# Patient Record
Sex: Male | Born: 1945
Health system: Southern US, Community
[De-identification: ages and names within clinical notes are randomized; demographics above are authoritative.]

## PROBLEM LIST (undated history)

## (undated) DIAGNOSIS — G4700 Insomnia, unspecified: Secondary | ICD-10-CM

## (undated) DIAGNOSIS — K219 Gastro-esophageal reflux disease without esophagitis: Secondary | ICD-10-CM

## (undated) DIAGNOSIS — M1990 Unspecified osteoarthritis, unspecified site: Secondary | ICD-10-CM

## (undated) DIAGNOSIS — R51 Headache: Secondary | ICD-10-CM

## (undated) DIAGNOSIS — Z8619 Personal history of other infectious and parasitic diseases: Secondary | ICD-10-CM

## (undated) DIAGNOSIS — I219 Acute myocardial infarction, unspecified: Secondary | ICD-10-CM

## (undated) DIAGNOSIS — M549 Dorsalgia, unspecified: Secondary | ICD-10-CM

## (undated) DIAGNOSIS — T4145XA Adverse effect of unspecified anesthetic, initial encounter: Secondary | ICD-10-CM

## (undated) DIAGNOSIS — T8859XA Other complications of anesthesia, initial encounter: Secondary | ICD-10-CM

## (undated) DIAGNOSIS — M2550 Pain in unspecified joint: Secondary | ICD-10-CM

## (undated) DIAGNOSIS — M109 Gout, unspecified: Secondary | ICD-10-CM

## (undated) DIAGNOSIS — G8929 Other chronic pain: Secondary | ICD-10-CM

## (undated) DIAGNOSIS — IMO0001 Reserved for inherently not codable concepts without codable children: Secondary | ICD-10-CM

## (undated) DIAGNOSIS — J189 Pneumonia, unspecified organism: Secondary | ICD-10-CM

## (undated) DIAGNOSIS — Z7189 Other specified counseling: Secondary | ICD-10-CM

## (undated) DIAGNOSIS — R1310 Dysphagia, unspecified: Secondary | ICD-10-CM

## (undated) DIAGNOSIS — G4733 Obstructive sleep apnea (adult) (pediatric): Secondary | ICD-10-CM

## (undated) DIAGNOSIS — Z860100 Personal history of colon polyps, unspecified: Secondary | ICD-10-CM

## (undated) DIAGNOSIS — K5790 Diverticulosis of intestine, part unspecified, without perforation or abscess without bleeding: Secondary | ICD-10-CM

## (undated) DIAGNOSIS — R519 Headache, unspecified: Secondary | ICD-10-CM

## (undated) DIAGNOSIS — J45909 Unspecified asthma, uncomplicated: Secondary | ICD-10-CM

## (undated) DIAGNOSIS — R0902 Hypoxemia: Secondary | ICD-10-CM

## (undated) DIAGNOSIS — N159 Renal tubulo-interstitial disease, unspecified: Secondary | ICD-10-CM

## (undated) DIAGNOSIS — I10 Essential (primary) hypertension: Secondary | ICD-10-CM

## (undated) DIAGNOSIS — E039 Hypothyroidism, unspecified: Secondary | ICD-10-CM

## (undated) DIAGNOSIS — F419 Anxiety disorder, unspecified: Secondary | ICD-10-CM

## (undated) DIAGNOSIS — H409 Unspecified glaucoma: Secondary | ICD-10-CM

## (undated) HISTORY — PX: KNEE ARTHROSCOPY: SUR90

## (undated) HISTORY — DX: Other specified counseling: Z71.89

## (undated) HISTORY — PX: HERNIA REPAIR: SHX51

## (undated) HISTORY — PX: ESOPHAGOGASTRODUODENOSCOPY: SHX1529

## (undated) HISTORY — DX: Hypoxemia: R09.02

## (undated) HISTORY — PX: EYE SURGERY: SHX253

## (undated) HISTORY — PX: CARDIAC CATHETERIZATION: SHX172

## (undated) HISTORY — PX: COLONOSCOPY: SHX174

## (undated) HISTORY — DX: Obstructive sleep apnea (adult) (pediatric): G47.33

## (undated) HISTORY — PX: TONSILLECTOMY: SUR1361

## (undated) HISTORY — PX: BACK SURGERY: SHX140

---

## 2004-09-30 ENCOUNTER — Ambulatory Visit: Payer: Self-pay | Admitting: Cardiology

## 2004-10-01 ENCOUNTER — Inpatient Hospital Stay (HOSPITAL_COMMUNITY): Admission: AD | Admit: 2004-10-01 | Discharge: 2004-10-05 | Payer: Self-pay | Admitting: Internal Medicine

## 2013-12-21 ENCOUNTER — Other Ambulatory Visit: Payer: Self-pay | Admitting: Orthopedic Surgery

## 2013-12-21 DIAGNOSIS — M47812 Spondylosis without myelopathy or radiculopathy, cervical region: Secondary | ICD-10-CM

## 2013-12-31 ENCOUNTER — Ambulatory Visit
Admission: RE | Admit: 2013-12-31 | Discharge: 2013-12-31 | Disposition: A | Discharge status: Home / Self Care | Payer: Medicare Other | Source: Ambulatory Visit | Attending: Orthopedic Surgery | Admitting: Orthopedic Surgery

## 2013-12-31 DIAGNOSIS — M47812 Spondylosis without myelopathy or radiculopathy, cervical region: Secondary | ICD-10-CM

## 2014-01-21 DIAGNOSIS — M5032 Other cervical disc degeneration, mid-cervical region: Secondary | ICD-10-CM | POA: Diagnosis not present

## 2014-01-21 DIAGNOSIS — M5412 Radiculopathy, cervical region: Secondary | ICD-10-CM | POA: Diagnosis not present

## 2014-02-11 DIAGNOSIS — E785 Hyperlipidemia, unspecified: Secondary | ICD-10-CM | POA: Diagnosis not present

## 2014-02-11 DIAGNOSIS — I10 Essential (primary) hypertension: Secondary | ICD-10-CM | POA: Diagnosis not present

## 2014-02-11 DIAGNOSIS — I2510 Atherosclerotic heart disease of native coronary artery without angina pectoris: Secondary | ICD-10-CM | POA: Diagnosis not present

## 2014-02-11 DIAGNOSIS — E039 Hypothyroidism, unspecified: Secondary | ICD-10-CM | POA: Diagnosis not present

## 2014-02-11 DIAGNOSIS — M542 Cervicalgia: Secondary | ICD-10-CM | POA: Diagnosis not present

## 2014-02-15 DIAGNOSIS — M47812 Spondylosis without myelopathy or radiculopathy, cervical region: Secondary | ICD-10-CM | POA: Diagnosis not present

## 2014-02-15 DIAGNOSIS — M5412 Radiculopathy, cervical region: Secondary | ICD-10-CM | POA: Diagnosis not present

## 2014-02-23 DIAGNOSIS — H4011X1 Primary open-angle glaucoma, mild stage: Secondary | ICD-10-CM | POA: Diagnosis not present

## 2014-03-02 DIAGNOSIS — Z6841 Body Mass Index (BMI) 40.0 and over, adult: Secondary | ICD-10-CM | POA: Diagnosis not present

## 2014-03-02 DIAGNOSIS — M4722 Other spondylosis with radiculopathy, cervical region: Secondary | ICD-10-CM | POA: Diagnosis not present

## 2014-03-11 DIAGNOSIS — I10 Essential (primary) hypertension: Secondary | ICD-10-CM | POA: Diagnosis not present

## 2014-03-11 DIAGNOSIS — E785 Hyperlipidemia, unspecified: Secondary | ICD-10-CM | POA: Diagnosis not present

## 2014-03-11 DIAGNOSIS — E039 Hypothyroidism, unspecified: Secondary | ICD-10-CM | POA: Diagnosis not present

## 2014-03-11 DIAGNOSIS — L309 Dermatitis, unspecified: Secondary | ICD-10-CM | POA: Diagnosis not present

## 2014-03-11 DIAGNOSIS — J0101 Acute recurrent maxillary sinusitis: Secondary | ICD-10-CM | POA: Diagnosis not present

## 2014-03-11 DIAGNOSIS — I2510 Atherosclerotic heart disease of native coronary artery without angina pectoris: Secondary | ICD-10-CM | POA: Diagnosis not present

## 2014-03-21 ENCOUNTER — Other Ambulatory Visit (HOSPITAL_COMMUNITY): Payer: Self-pay | Admitting: Neurosurgery

## 2014-03-22 DIAGNOSIS — E039 Hypothyroidism, unspecified: Secondary | ICD-10-CM | POA: Diagnosis not present

## 2014-03-22 DIAGNOSIS — I2510 Atherosclerotic heart disease of native coronary artery without angina pectoris: Secondary | ICD-10-CM | POA: Diagnosis not present

## 2014-03-22 DIAGNOSIS — M542 Cervicalgia: Secondary | ICD-10-CM | POA: Diagnosis not present

## 2014-03-22 DIAGNOSIS — N390 Urinary tract infection, site not specified: Secondary | ICD-10-CM | POA: Diagnosis not present

## 2014-03-22 DIAGNOSIS — E785 Hyperlipidemia, unspecified: Secondary | ICD-10-CM | POA: Diagnosis not present

## 2014-04-04 DIAGNOSIS — I2510 Atherosclerotic heart disease of native coronary artery without angina pectoris: Secondary | ICD-10-CM | POA: Diagnosis not present

## 2014-04-04 DIAGNOSIS — K649 Unspecified hemorrhoids: Secondary | ICD-10-CM | POA: Diagnosis not present

## 2014-04-04 DIAGNOSIS — N390 Urinary tract infection, site not specified: Secondary | ICD-10-CM | POA: Diagnosis not present

## 2014-04-04 DIAGNOSIS — E785 Hyperlipidemia, unspecified: Secondary | ICD-10-CM | POA: Diagnosis not present

## 2014-04-04 DIAGNOSIS — E039 Hypothyroidism, unspecified: Secondary | ICD-10-CM | POA: Diagnosis not present

## 2014-04-11 ENCOUNTER — Encounter (HOSPITAL_COMMUNITY): Payer: Self-pay

## 2014-04-11 ENCOUNTER — Encounter (HOSPITAL_COMMUNITY)
Admission: RE | Admit: 2014-04-11 | Discharge: 2014-04-11 | Disposition: A | Discharge status: Home / Self Care | Payer: Medicare Other | Source: Ambulatory Visit | Attending: Neurosurgery | Admitting: Neurosurgery

## 2014-04-11 DIAGNOSIS — Z01812 Encounter for preprocedural laboratory examination: Secondary | ICD-10-CM | POA: Insufficient documentation

## 2014-04-11 DIAGNOSIS — I252 Old myocardial infarction: Secondary | ICD-10-CM | POA: Diagnosis not present

## 2014-04-11 DIAGNOSIS — E039 Hypothyroidism, unspecified: Secondary | ICD-10-CM | POA: Insufficient documentation

## 2014-04-11 DIAGNOSIS — I10 Essential (primary) hypertension: Secondary | ICD-10-CM | POA: Diagnosis not present

## 2014-04-11 HISTORY — DX: Personal history of colon polyps, unspecified: Z86.0100

## 2014-04-11 HISTORY — DX: Gastro-esophageal reflux disease without esophagitis: K21.9

## 2014-04-11 HISTORY — DX: Acute myocardial infarction, unspecified: I21.9

## 2014-04-11 HISTORY — DX: Dorsalgia, unspecified: M54.9

## 2014-04-11 HISTORY — DX: Unspecified glaucoma: H40.9

## 2014-04-11 HISTORY — DX: Essential (primary) hypertension: I10

## 2014-04-11 HISTORY — DX: Personal history of colonic polyps: Z86.010

## 2014-04-11 HISTORY — DX: Other chronic pain: G89.29

## 2014-04-11 HISTORY — DX: Unspecified osteoarthritis, unspecified site: M19.90

## 2014-04-11 HISTORY — DX: Hypothyroidism, unspecified: E03.9

## 2014-04-11 HISTORY — DX: Diverticulosis of intestine, part unspecified, without perforation or abscess without bleeding: K57.90

## 2014-04-11 HISTORY — DX: Gout, unspecified: M10.9

## 2014-04-11 HISTORY — DX: Pneumonia, unspecified organism: J18.9

## 2014-04-11 HISTORY — DX: Headache, unspecified: R51.9

## 2014-04-11 HISTORY — DX: Pain in unspecified joint: M25.50

## 2014-04-11 HISTORY — DX: Personal history of other infectious and parasitic diseases: Z86.19

## 2014-04-11 HISTORY — DX: Unspecified asthma, uncomplicated: J45.909

## 2014-04-11 HISTORY — DX: Insomnia, unspecified: G47.00

## 2014-04-11 HISTORY — DX: Renal tubulo-interstitial disease, unspecified: N15.9

## 2014-04-11 HISTORY — DX: Headache: R51

## 2014-04-11 HISTORY — DX: Reserved for inherently not codable concepts without codable children: IMO0001

## 2014-04-11 HISTORY — DX: Anxiety disorder, unspecified: F41.9

## 2014-04-11 LAB — BASIC METABOLIC PANEL
Anion gap: 10 (ref 5–15)
BUN: 14 mg/dL (ref 6–23)
CO2: 25 mmol/L (ref 19–32)
Calcium: 9.2 mg/dL (ref 8.4–10.5)
Chloride: 105 mmol/L (ref 96–112)
Creatinine, Ser: 0.93 mg/dL (ref 0.50–1.35)
GFR calc Af Amer: >90 mL/min (ref >90)
GFR calc non Af Amer: 83 mL/min — ABNORMAL LOW (ref >90)
Glucose, Bld: 114 mg/dL — ABNORMAL HIGH (ref 70–99)
Potassium: 3.9 mmol/L (ref 3.5–5.1)
Sodium: 140 mmol/L (ref 135–145)

## 2014-04-11 LAB — CBC
HCT: 44.5 % (ref 39.0–52.0)
Hemoglobin: 15.1 g/dL (ref 13.0–17.0)
MCH: 32 pg (ref 26.0–34.0)
MCHC: 33.9 g/dL (ref 30.0–36.0)
MCV: 94.3 fL (ref 78.0–100.0)
Platelets: 245 10*3/uL (ref 150–400)
RBC: 4.72 MIL/uL (ref 4.22–5.81)
RDW: 13.2 % (ref 11.5–15.5)
WBC: 8.1 10*3/uL (ref 4.0–10.5)

## 2014-04-11 LAB — SURGICAL PCR SCREEN
MRSA, PCR: NEGATIVE
Staphylococcus aureus: NEGATIVE

## 2014-04-11 NOTE — Pre-Procedure Instructions (Addendum)
ARLINGTON SIGMUND  04/11/2014   Your procedure is scheduled on:  Tues, April 5 @ 12:00 PM  Report to [facility]  at 10:00 AM.  Call this number if you have problems the morning of surgery: [phone]   Remember:   Do not eat food or drink liquids after midnight.   Take these medicines the morning of surgery with A SIP OF WATER: Metoprolol(Toprol),Synthroid(Levothyroxine),Pain Pill(if needed),and Xanax(Alprazolam)              No Goody's,BC's,Aleve,Aspirin,Ibuprofen,Fish Oil,or any Herbal Medications.   Do not wear jewelry.  Do not wear lotions, powders, or colognes. You may wear deodorant.  Men may shave face and neck.  Do not bring valuables to the hospital.  [facility] is not responsible                  for any belongings or valuables.               Contacts, dentures or bridgework may not be worn into surgery.  Leave suitcase in the car. After surgery it may be brought to your room.  For patients admitted to the hospital, discharge time is determined by your                treatment team.                Special Instructions:  [place] - Preparing for Surgery  Before surgery, you can play an important role.  Because skin is not sterile, your skin needs to be as free of germs as possible.  You can reduce the number of germs on you skin by washing with CHG (chlorahexidine gluconate) soap before surgery.  CHG is an antiseptic cleaner which kills germs and bonds with the skin to continue killing germs even after washing.  Please DO NOT use if you have an allergy to CHG or antibacterial soaps.  If your skin becomes reddened/irritated stop using the CHG and inform your nurse when you arrive at Short Stay.  Do not shave (including legs and underarms) for at least 48 hours prior to the first CHG shower.  You may shave your face.  Please follow these instructions carefully:   1.  Shower with CHG Soap the night before surgery and the                                morning  of Surgery.  2.  If you choose to wash your hair, wash your hair first as usual with your       normal shampoo.  3.  After you shampoo, rinse your hair and body thoroughly to remove the                      Shampoo.  4.  Use CHG as you would any other liquid soap.  You can apply chg directly       to the skin and wash gently with scrungie or a clean washcloth.  5.  Apply the CHG Soap to your body ONLY FROM THE NECK DOWN.        Do not use on open wounds or open sores.  Avoid contact with your eyes,       ears, mouth and genitals (private parts).  Wash genitals (private parts)       with your normal soap.  6.  Wash thoroughly, paying special  attention to the area where your surgery        will be performed.  7.  Thoroughly rinse your body with warm water from the neck down.  8.  DO NOT shower/wash with your normal soap after using and rinsing off       the CHG Soap.  9.  Pat yourself dry with a clean towel.            10.  Wear clean pajamas.            11.  Place clean sheets on your bed the night of your first shower and do not        sleep with pets.  Day of Surgery  Do not apply any lotions/deoderants the morning of surgery.  Please wear clean clothes to the hospital/surgery center.     Please read over the following fact sheets that you were given: Pain Booklet, Coughing and Deep Breathing, MRSA Information and Surgical Site Infection Prevention

## 2014-04-11 NOTE — Progress Notes (Addendum)
Dr.Revencar is cardiologist-last visit a few months ago-to request report([facility])  EKG done a couple of months ago to request from Dr.Revencar([facility])  Stress test done a couple of months ago-to request from [place] is Dr.Lee with [facility]   Echo done many years ago-so long he can't rememeber  Heart cath > 14yrs ago

## 2014-04-12 NOTE — Progress Notes (Addendum)
Anesthesia Chart Review:  Patient is a 69 year old male scheduled for C3-4, C4-5, C5-6 ACDF on 04/19/14 by Dr. Kathyrn Sheriff.  History includes non-smoker, HTN, MI > 10 years ago, exertional dyspnea, asthma, glaucoma, GERD, hypothyroidism, anxiety, spinal fusion. BMI is consistent with morbid obesity. PCP is Dr. Truman Hayward with [facility].  Cardiologist is Dr. Lennox Pippins with [facility] in [place].   Meds include albuterol, Xanax, Bentyl, Xalatan, levothyroxine, Toprol, Halcion.  Preoperative labs noted.   Chart will be left for follow-up regarding most recent cardiology records. He reported recent cardiology visit with stress test.  Last cath and echo > 10 years ago per patient.  [facility] Short Stay Center/Anesthesiology Phone [phone] 04/12/2014 11:05 AM  Addendum: Cardiology records re-requested today and yesterday, but are still pending.  I was able to get some records from [facility] including his 2015 EKG and stress test.    08/10/13 Lexiscan Sestamibi Rest Stress Scan: No evidence of ischemia on the scan. Normal EF, 65%. No significant symptoms, EKG changes, or arrhythmias occurred.  He had two EKGs that day one that showed NSR and one that showed NSR, cannot rule out inferior infarct (age undetermined)--although both EKGs appeared similar with isolated Q wave in lead III. I think there is also non-specific inferior ST abnormality.    I've asked nursing staff to have me review any additional cardiac records if received, but based on his recent non-ischemic stress test I would anticipate that he could proceed as planned.  [facility] Short Stay Center/Anesthesiology Phone [phone] 04/14/2014 3:55 PM

## 2014-04-18 MED ORDER — DEXTROSE 5 % IV SOLN
3.0000 g | INTRAVENOUS | Status: AC
  Administered 2014-04-19 (×2): 3 g via INTRAVENOUS
  Filled 2014-04-18: qty 3000

## 2014-04-19 ENCOUNTER — Encounter (HOSPITAL_COMMUNITY): Payer: Self-pay | Admitting: Anesthesiology

## 2014-04-19 ENCOUNTER — Inpatient Hospital Stay (HOSPITAL_COMMUNITY): Payer: Medicare Other

## 2014-04-19 ENCOUNTER — Inpatient Hospital Stay (HOSPITAL_COMMUNITY)
Admission: RE | Admit: 2014-04-19 | Discharge: 2014-04-23 | DRG: 473 | Disposition: A | Discharge status: Home / Self Care | Payer: Medicare Other | Source: Ambulatory Visit | Attending: Neurosurgery | Admitting: Neurosurgery

## 2014-04-19 ENCOUNTER — Encounter (HOSPITAL_COMMUNITY)
Admission: RE | Disposition: A | Discharge status: Home / Self Care | Payer: Self-pay | Source: Ambulatory Visit | Attending: Neurosurgery

## 2014-04-19 ENCOUNTER — Inpatient Hospital Stay (HOSPITAL_COMMUNITY): Payer: Medicare Other | Admitting: Emergency Medicine

## 2014-04-19 ENCOUNTER — Inpatient Hospital Stay (HOSPITAL_COMMUNITY): Payer: Medicare Other | Admitting: Vascular Surgery

## 2014-04-19 DIAGNOSIS — M109 Gout, unspecified: Secondary | ICD-10-CM | POA: Diagnosis present

## 2014-04-19 DIAGNOSIS — K5790 Diverticulosis of intestine, part unspecified, without perforation or abscess without bleeding: Secondary | ICD-10-CM | POA: Diagnosis present

## 2014-04-19 DIAGNOSIS — M542 Cervicalgia: Secondary | ICD-10-CM | POA: Diagnosis present

## 2014-04-19 DIAGNOSIS — K219 Gastro-esophageal reflux disease without esophagitis: Secondary | ICD-10-CM | POA: Diagnosis present

## 2014-04-19 DIAGNOSIS — Z79899 Other long term (current) drug therapy: Secondary | ICD-10-CM | POA: Diagnosis not present

## 2014-04-19 DIAGNOSIS — N159 Renal tubulo-interstitial disease, unspecified: Secondary | ICD-10-CM | POA: Diagnosis not present

## 2014-04-19 DIAGNOSIS — I252 Old myocardial infarction: Secondary | ICD-10-CM | POA: Diagnosis not present

## 2014-04-19 DIAGNOSIS — F419 Anxiety disorder, unspecified: Secondary | ICD-10-CM | POA: Diagnosis not present

## 2014-04-19 DIAGNOSIS — E039 Hypothyroidism, unspecified: Secondary | ICD-10-CM | POA: Diagnosis not present

## 2014-04-19 DIAGNOSIS — Z981 Arthrodesis status: Secondary | ICD-10-CM | POA: Diagnosis not present

## 2014-04-19 DIAGNOSIS — M1990 Unspecified osteoarthritis, unspecified site: Secondary | ICD-10-CM | POA: Diagnosis not present

## 2014-04-19 DIAGNOSIS — Z419 Encounter for procedure for purposes other than remedying health state, unspecified: Secondary | ICD-10-CM

## 2014-04-19 DIAGNOSIS — M549 Dorsalgia, unspecified: Secondary | ICD-10-CM | POA: Diagnosis not present

## 2014-04-19 DIAGNOSIS — R1310 Dysphagia, unspecified: Secondary | ICD-10-CM | POA: Diagnosis not present

## 2014-04-19 DIAGNOSIS — I10 Essential (primary) hypertension: Secondary | ICD-10-CM | POA: Diagnosis present

## 2014-04-19 DIAGNOSIS — G8929 Other chronic pain: Secondary | ICD-10-CM | POA: Diagnosis present

## 2014-04-19 DIAGNOSIS — M5001 Cervical disc disorder with myelopathy,  high cervical region: Secondary | ICD-10-CM | POA: Diagnosis not present

## 2014-04-19 DIAGNOSIS — M4712 Other spondylosis with myelopathy, cervical region: Secondary | ICD-10-CM | POA: Diagnosis not present

## 2014-04-19 DIAGNOSIS — H409 Unspecified glaucoma: Secondary | ICD-10-CM | POA: Diagnosis present

## 2014-04-19 DIAGNOSIS — Z7951 Long term (current) use of inhaled steroids: Secondary | ICD-10-CM

## 2014-04-19 DIAGNOSIS — G4700 Insomnia, unspecified: Secondary | ICD-10-CM | POA: Diagnosis not present

## 2014-04-19 DIAGNOSIS — M4322 Fusion of spine, cervical region: Secondary | ICD-10-CM | POA: Diagnosis not present

## 2014-04-19 DIAGNOSIS — M4802 Spinal stenosis, cervical region: Secondary | ICD-10-CM | POA: Diagnosis not present

## 2014-04-19 HISTORY — PX: ANTERIOR CERVICAL DECOMP/DISCECTOMY FUSION: SHX1161

## 2014-04-19 LAB — CREATININE, SERUM
Creatinine, Ser: 1.21 mg/dL (ref 0.50–1.35)
GFR calc Af Amer: 68 mL/min — ABNORMAL LOW (ref >90)
GFR calc non Af Amer: 59 mL/min — ABNORMAL LOW (ref >90)

## 2014-04-19 LAB — CBC
HCT: 43.1 % (ref 39.0–52.0)
Hemoglobin: 14.4 g/dL (ref 13.0–17.0)
MCH: 31.2 pg (ref 26.0–34.0)
MCHC: 33.4 g/dL (ref 30.0–36.0)
MCV: 93.5 fL (ref 78.0–100.0)
Platelets: 176 10*3/uL (ref 150–400)
RBC: 4.61 MIL/uL (ref 4.22–5.81)
RDW: 13.5 % (ref 11.5–15.5)
WBC: 9.5 10*3/uL (ref 4.0–10.5)

## 2014-04-19 SURGERY — ANTERIOR CERVICAL DECOMPRESSION/DISCECTOMY FUSION 3 LEVELS
Anesthesia: General

## 2014-04-19 MED ORDER — PHENYLEPHRINE HCL 10 MG/ML IJ SOLN
INTRAMUSCULAR | Status: AC
  Filled 2014-04-19: qty 2

## 2014-04-19 MED ORDER — ONDANSETRON HCL 4 MG/2ML IJ SOLN
INTRAMUSCULAR | Status: DC | PRN
  Administered 2014-04-19: 4 mg via INTRAVENOUS

## 2014-04-19 MED ORDER — SODIUM CHLORIDE 0.9 % IV SOLN: INTRAVENOUS | Status: DC

## 2014-04-19 MED ORDER — SUCCINYLCHOLINE CHLORIDE 20 MG/ML IJ SOLN
INTRAMUSCULAR | Status: AC
  Filled 2014-04-19: qty 1

## 2014-04-19 MED ORDER — DICYCLOMINE HCL 10 MG PO CAPS
10.0000 mg | ORAL_CAPSULE | Freq: Four times a day (QID) | ORAL | Status: DC | PRN
  Filled 2014-04-19: qty 1

## 2014-04-19 MED ORDER — SODIUM CHLORIDE 0.9 % IJ SOLN
3.0000 mL | Freq: Two times a day (BID) | INTRAMUSCULAR | Status: DC
  Administered 2014-04-21 – 2014-04-22 (×3): 3 mL via INTRAVENOUS

## 2014-04-19 MED ORDER — LACTATED RINGERS IV SOLN
INTRAVENOUS | Status: DC
  Administered 2014-04-19 (×4): via INTRAVENOUS

## 2014-04-19 MED ORDER — MORPHINE SULFATE 2 MG/ML IJ SOLN
1.0000 mg | INTRAMUSCULAR | Status: DC | PRN
  Administered 2014-04-19 – 2014-04-20 (×6): 4 mg via INTRAVENOUS
  Administered 2014-04-21 (×2): 2 mg via INTRAVENOUS
  Administered 2014-04-21 – 2014-04-22 (×3): 4 mg via INTRAVENOUS
  Filled 2014-04-19 (×4): qty 2
  Filled 2014-04-19: qty 1
  Filled 2014-04-19 (×4): qty 2
  Filled 2014-04-19: qty 1
  Filled 2014-04-19: qty 2

## 2014-04-19 MED ORDER — LIDOCAINE-EPINEPHRINE 1 %-1:100000 IJ SOLN
INTRAMUSCULAR | Status: DC | PRN
  Administered 2014-04-19: 7 mL

## 2014-04-19 MED ORDER — SODIUM CHLORIDE 0.9 % IJ SOLN: 3.0000 mL | INTRAMUSCULAR | Status: DC | PRN

## 2014-04-19 MED ORDER — ONDANSETRON HCL 4 MG/2ML IJ SOLN: 4.0000 mg | INTRAMUSCULAR | Status: DC | PRN

## 2014-04-19 MED ORDER — LORATADINE 10 MG PO TABS
10.0000 mg | ORAL_TABLET | Freq: Every day | ORAL | Status: DC | PRN
  Administered 2014-04-23: 10 mg via ORAL
  Filled 2014-04-19 (×2): qty 1

## 2014-04-19 MED ORDER — HYDROCHLOROTHIAZIDE 12.5 MG PO CAPS
12.5000 mg | ORAL_CAPSULE | Freq: Every day | ORAL | Status: DC
  Administered 2014-04-21 – 2014-04-22 (×2): 12.5 mg via ORAL
  Filled 2014-04-19 (×5): qty 1

## 2014-04-19 MED ORDER — FENTANYL CITRATE 0.05 MG/ML IJ SOLN
INTRAMUSCULAR | Status: AC
  Filled 2014-04-19: qty 5

## 2014-04-19 MED ORDER — MEPERIDINE HCL 25 MG/ML IJ SOLN: 6.2500 mg | INTRAMUSCULAR | Status: DC | PRN

## 2014-04-19 MED ORDER — LISINOPRIL-HYDROCHLOROTHIAZIDE 20-12.5 MG PO TABS: 1.0000 | ORAL_TABLET | Freq: Every day | ORAL | Status: DC

## 2014-04-19 MED ORDER — ACETAMINOPHEN 10 MG/ML IV SOLN: 1000.0000 mg | Freq: Once | INTRAVENOUS | Status: DC

## 2014-04-19 MED ORDER — METOPROLOL SUCCINATE ER 50 MG PO TB24
50.0000 mg | ORAL_TABLET | Freq: Every day | ORAL | Status: DC
Start: 2014-04-20 — End: 2014-04-23
  Administered 2014-04-21 – 2014-04-22 (×2): 50 mg via ORAL
  Filled 2014-04-19 (×4): qty 1

## 2014-04-19 MED ORDER — ALBUTEROL SULFATE (2.5 MG/3ML) 0.083% IN NEBU
2.5000 mg | INHALATION_SOLUTION | Freq: Four times a day (QID) | RESPIRATORY_TRACT | Status: DC | PRN
  Administered 2014-04-23: 2.5 mg via RESPIRATORY_TRACT
  Filled 2014-04-19: qty 3

## 2014-04-19 MED ORDER — THROMBIN 20000 UNITS EX SOLR
CUTANEOUS | Status: DC | PRN
  Administered 2014-04-19: 11:00:00 via TOPICAL

## 2014-04-19 MED ORDER — PHENYLEPHRINE 40 MCG/ML (10ML) SYRINGE FOR IV PUSH (FOR BLOOD PRESSURE SUPPORT)
PREFILLED_SYRINGE | INTRAVENOUS | Status: AC
  Filled 2014-04-19: qty 10

## 2014-04-19 MED ORDER — EPHEDRINE SULFATE 50 MG/ML IJ SOLN
INTRAMUSCULAR | Status: AC
  Filled 2014-04-19: qty 1

## 2014-04-19 MED ORDER — SODIUM CHLORIDE 0.9 % IV SOLN: 250.0000 mL | INTRAVENOUS | Status: DC

## 2014-04-19 MED ORDER — HEPARIN SODIUM (PORCINE) 5000 UNIT/ML IJ SOLN
5000.0000 [IU] | Freq: Three times a day (TID) | INTRAMUSCULAR | Status: DC
  Administered 2014-04-20 – 2014-04-23 (×10): 5000 [IU] via SUBCUTANEOUS
  Filled 2014-04-19 (×13): qty 1

## 2014-04-19 MED ORDER — HYDROMORPHONE HCL 1 MG/ML IJ SOLN
0.2500 mg | INTRAMUSCULAR | Status: DC | PRN
Start: 2014-04-19 — End: 2014-04-19
  Administered 2014-04-19 (×4): 0.5 mg via INTRAVENOUS

## 2014-04-19 MED ORDER — BACITRACIN 50000 UNITS IM SOLR
INTRAMUSCULAR | Status: DC | PRN
  Administered 2014-04-19: 12:00:00

## 2014-04-19 MED ORDER — LEVOTHYROXINE SODIUM 200 MCG PO TABS
200.0000 ug | ORAL_TABLET | Freq: Every day | ORAL | Status: DC
  Administered 2014-04-20 – 2014-04-23 (×4): 200 ug via ORAL
  Filled 2014-04-19 (×5): qty 1

## 2014-04-19 MED ORDER — PROPOFOL 10 MG/ML IV BOLUS
INTRAVENOUS | Status: DC | PRN
  Administered 2014-04-19: 200 mg via INTRAVENOUS

## 2014-04-19 MED ORDER — PROMETHAZINE HCL 25 MG/ML IJ SOLN: 6.2500 mg | INTRAMUSCULAR | Status: DC | PRN

## 2014-04-19 MED ORDER — 0.9 % SODIUM CHLORIDE (POUR BTL) OPTIME
TOPICAL | Status: DC | PRN
  Administered 2014-04-19: 1000 mL

## 2014-04-19 MED ORDER — MENTHOL 3 MG MT LOZG: 1.0000 | LOZENGE | OROMUCOSAL | Status: DC | PRN

## 2014-04-19 MED ORDER — DEXAMETHASONE SODIUM PHOSPHATE 10 MG/ML IJ SOLN
INTRAMUSCULAR | Status: AC
  Filled 2014-04-19: qty 2

## 2014-04-19 MED ORDER — OXYCODONE-ACETAMINOPHEN 5-325 MG PO TABS
1.0000 | ORAL_TABLET | ORAL | Status: DC | PRN
  Administered 2014-04-19: 2 via ORAL
  Filled 2014-04-19: qty 2

## 2014-04-19 MED ORDER — PROPOFOL 10 MG/ML IV BOLUS
INTRAVENOUS | Status: AC
  Filled 2014-04-19: qty 20

## 2014-04-19 MED ORDER — ROCURONIUM BROMIDE 50 MG/5ML IV SOLN
INTRAVENOUS | Status: AC
  Filled 2014-04-19: qty 1

## 2014-04-19 MED ORDER — MIDAZOLAM HCL 2 MG/2ML IJ SOLN
INTRAMUSCULAR | Status: DC | PRN
  Administered 2014-04-19: 2 mg via INTRAVENOUS

## 2014-04-19 MED ORDER — SUCCINYLCHOLINE CHLORIDE 20 MG/ML IJ SOLN
INTRAMUSCULAR | Status: DC | PRN
  Administered 2014-04-19: 140 mg via INTRAVENOUS

## 2014-04-19 MED ORDER — ALPRAZOLAM 0.5 MG PO TABS
0.5000 mg | ORAL_TABLET | Freq: Three times a day (TID) | ORAL | Status: DC | PRN
  Administered 2014-04-19 – 2014-04-20 (×2): 0.5 mg via ORAL
  Filled 2014-04-19 (×2): qty 1

## 2014-04-19 MED ORDER — ACETAMINOPHEN 325 MG PO TABS: 650.0000 mg | ORAL_TABLET | ORAL | Status: DC | PRN

## 2014-04-19 MED ORDER — ACETAMINOPHEN 650 MG RE SUPP: 650.0000 mg | RECTAL | Status: DC | PRN

## 2014-04-19 MED ORDER — HYDROMORPHONE HCL 1 MG/ML IJ SOLN
INTRAMUSCULAR | Status: AC
  Filled 2014-04-19: qty 1

## 2014-04-19 MED ORDER — GLYCOPYRROLATE 0.2 MG/ML IJ SOLN
INTRAMUSCULAR | Status: DC | PRN
  Administered 2014-04-19 (×2): 0.4 mg via INTRAVENOUS

## 2014-04-19 MED ORDER — LIDOCAINE HCL (CARDIAC) 20 MG/ML IV SOLN
INTRAVENOUS | Status: AC
  Filled 2014-04-19: qty 5

## 2014-04-19 MED ORDER — ALBUTEROL SULFATE HFA 108 (90 BASE) MCG/ACT IN AERS: 2.0000 | INHALATION_SPRAY | Freq: Four times a day (QID) | RESPIRATORY_TRACT | Status: DC | PRN

## 2014-04-19 MED ORDER — BUPIVACAINE HCL 0.5 % IJ SOLN
INTRAMUSCULAR | Status: DC | PRN
  Administered 2014-04-19: 7 mL

## 2014-04-19 MED ORDER — LISINOPRIL 20 MG PO TABS
20.0000 mg | ORAL_TABLET | Freq: Every day | ORAL | Status: DC
  Administered 2014-04-21 – 2014-04-22 (×2): 20 mg via ORAL
  Filled 2014-04-19 (×5): qty 1

## 2014-04-19 MED ORDER — TRIAZOLAM 0.125 MG PO TABS
0.2500 mg | ORAL_TABLET | Freq: Every evening | ORAL | Status: DC | PRN
  Administered 2014-04-23: 0.25 mg via ORAL
  Filled 2014-04-19: qty 2

## 2014-04-19 MED ORDER — SODIUM CHLORIDE 0.9 % IJ SOLN
INTRAMUSCULAR | Status: AC
  Filled 2014-04-19: qty 10

## 2014-04-19 MED ORDER — ROCURONIUM BROMIDE 100 MG/10ML IV SOLN
INTRAVENOUS | Status: DC | PRN
  Administered 2014-04-19: 50 mg via INTRAVENOUS

## 2014-04-19 MED ORDER — PHENYLEPHRINE HCL 10 MG/ML IJ SOLN
INTRAMUSCULAR | Status: DC | PRN
  Administered 2014-04-19: 80 ug via INTRAVENOUS

## 2014-04-19 MED ORDER — LATANOPROST 0.005 % OP SOLN
1.0000 [drp] | Freq: Every day | OPHTHALMIC | Status: DC
  Administered 2014-04-19 – 2014-04-22 (×4): 1 [drp] via OPHTHALMIC
  Filled 2014-04-19: qty 2.5

## 2014-04-19 MED ORDER — FENTANYL CITRATE 0.05 MG/ML IJ SOLN
INTRAMUSCULAR | Status: DC | PRN
  Administered 2014-04-19 (×4): 100 ug via INTRAVENOUS
  Administered 2014-04-19: 50 ug via INTRAVENOUS

## 2014-04-19 MED ORDER — PHENYLEPHRINE HCL 10 MG/ML IJ SOLN
20.0000 mg | INTRAVENOUS | Status: DC | PRN
  Administered 2014-04-19: 50 ug/min via INTRAVENOUS

## 2014-04-19 MED ORDER — MIDAZOLAM HCL 2 MG/2ML IJ SOLN
INTRAMUSCULAR | Status: AC
  Filled 2014-04-19: qty 2

## 2014-04-19 MED ORDER — THROMBIN 5000 UNITS EX SOLR
OROMUCOSAL | Status: DC | PRN
  Administered 2014-04-19 (×2): via TOPICAL

## 2014-04-19 MED ORDER — NEOSTIGMINE METHYLSULFATE 10 MG/10ML IV SOLN
INTRAVENOUS | Status: DC | PRN
  Administered 2014-04-19: 3 mg via INTRAVENOUS

## 2014-04-19 MED ORDER — GLYCOPYRROLATE 0.2 MG/ML IJ SOLN
INTRAMUSCULAR | Status: AC
  Filled 2014-04-19: qty 2

## 2014-04-19 MED ORDER — CEFAZOLIN SODIUM 1-5 GM-% IV SOLN
1.0000 g | Freq: Three times a day (TID) | INTRAVENOUS | Status: AC
  Administered 2014-04-19 – 2014-04-20 (×2): 1 g via INTRAVENOUS
  Filled 2014-04-19 (×2): qty 50

## 2014-04-19 MED ORDER — ONDANSETRON HCL 4 MG/2ML IJ SOLN
INTRAMUSCULAR | Status: AC
  Filled 2014-04-19: qty 2

## 2014-04-19 MED ORDER — DEXAMETHASONE SODIUM PHOSPHATE 10 MG/ML IJ SOLN
INTRAMUSCULAR | Status: DC | PRN
  Administered 2014-04-19: 10 mg via INTRAVENOUS

## 2014-04-19 MED ORDER — LIDOCAINE HCL (CARDIAC) 20 MG/ML IV SOLN
INTRAVENOUS | Status: DC | PRN
  Administered 2014-04-19: 50 mg via INTRAVENOUS

## 2014-04-19 MED ORDER — NEOSTIGMINE METHYLSULFATE 10 MG/10ML IV SOLN
INTRAVENOUS | Status: AC
  Filled 2014-04-19: qty 1

## 2014-04-19 MED ORDER — PHENOL 1.4 % MT LIQD
1.0000 | OROMUCOSAL | Status: DC | PRN
  Filled 2014-04-19: qty 177

## 2014-04-19 SURGICAL SUPPLY — 81 items
ADH SKN CLS APL DERMABOND .7 (GAUZE/BANDAGES/DRESSINGS) ×1
APL SKNCLS STERI-STRIP NONHPOA (GAUZE/BANDAGES/DRESSINGS) ×1
BAG DECANTER FOR FLEXI CONT (MISCELLANEOUS) ×3 IMPLANT
BENZOIN TINCTURE PRP APPL 2/3 (GAUZE/BANDAGES/DRESSINGS) ×2 IMPLANT
BLADE CLIPPER SURG (BLADE) ×2 IMPLANT
BLADE SURG 11 STRL SS (BLADE) ×3 IMPLANT
BUR MATCHSTICK NEURO 3.0 LAGG (BURR) ×3 IMPLANT
CAGE EXPANSE 8X6X6 (Cage) ×6 IMPLANT
CAGE PEEK 6X14X11 (Cage) ×4 IMPLANT
CAGE PEEK 7X14X11 (Cage) ×3 IMPLANT
CAGE SPNL 11X14X7XRADOPQ (Cage) IMPLANT
CANISTER SUCT 3000ML PPV (MISCELLANEOUS) ×3 IMPLANT
CLOSURE WOUND 1/2 X4 (GAUZE/BANDAGES/DRESSINGS) ×1
CONT SPEC 4OZ CLIKSEAL STRL BL (MISCELLANEOUS) ×3 IMPLANT
DECANTER SPIKE VIAL GLASS SM (MISCELLANEOUS) ×3 IMPLANT
DERMABOND ADVANCED (GAUZE/BANDAGES/DRESSINGS) ×2
DERMABOND ADVANCED .7 DNX12 (GAUZE/BANDAGES/DRESSINGS) IMPLANT
DRAIN CHANNEL 10M FLAT 3/4 FLT (DRAIN) IMPLANT
DRAPE C-ARM 42X72 X-RAY (DRAPES) ×6 IMPLANT
DRAPE LAPAROTOMY 100X72 PEDS (DRAPES) ×3 IMPLANT
DRAPE MICROSCOPE LEICA (MISCELLANEOUS) ×3 IMPLANT
DRAPE POUCH INSTRU U-SHP 10X18 (DRAPES) ×3 IMPLANT
DRAPE PROXIMA HALF (DRAPES) IMPLANT
DRSG OPSITE POSTOP 3X4 (GAUZE/BANDAGES/DRESSINGS) ×3 IMPLANT
DRSG OPSITE POSTOP 4X6 (GAUZE/BANDAGES/DRESSINGS) ×2 IMPLANT
DRSG TEGADERM 4X4.75 (GAUZE/BANDAGES/DRESSINGS) ×8 IMPLANT
DURAPREP 6ML APPLICATOR 50/CS (WOUND CARE) ×3 IMPLANT
ELECT COATED BLADE 2.86 ST (ELECTRODE) ×3 IMPLANT
ELECT REM PT RETURN 9FT ADLT (ELECTROSURGICAL) ×3
ELECTRODE REM PT RTRN 9FT ADLT (ELECTROSURGICAL) ×1 IMPLANT
EVACUATOR SILICONE 100CC (DRAIN) IMPLANT
GAUZE SPONGE 4X4 16PLY XRAY LF (GAUZE/BANDAGES/DRESSINGS) IMPLANT
GLOVE BIO SURGEON STRL SZ7 (GLOVE) ×6 IMPLANT
GLOVE BIOGEL PI IND STRL 7.0 (GLOVE) IMPLANT
GLOVE BIOGEL PI IND STRL 7.5 (GLOVE) ×1 IMPLANT
GLOVE BIOGEL PI INDICATOR 7.0 (GLOVE) ×4
GLOVE BIOGEL PI INDICATOR 7.5 (GLOVE) ×8
GLOVE ECLIPSE 7.0 STRL STRAW (GLOVE) ×3 IMPLANT
GLOVE ECLIPSE 7.5 STRL STRAW (GLOVE) ×2 IMPLANT
GLOVE ECLIPSE 8.5 STRL (GLOVE) ×2 IMPLANT
GLOVE EXAM NITRILE LRG STRL (GLOVE) IMPLANT
GLOVE EXAM NITRILE MD LF STRL (GLOVE) IMPLANT
GLOVE EXAM NITRILE XL STR (GLOVE) IMPLANT
GLOVE EXAM NITRILE XS STR PU (GLOVE) IMPLANT
GLOVE INDICATOR 8.5 STRL (GLOVE) ×2 IMPLANT
GOWN STRL REUS W/ TWL LRG LVL3 (GOWN DISPOSABLE) ×2 IMPLANT
GOWN STRL REUS W/ TWL XL LVL3 (GOWN DISPOSABLE) IMPLANT
GOWN STRL REUS W/TWL 2XL LVL3 (GOWN DISPOSABLE) ×4 IMPLANT
GOWN STRL REUS W/TWL LRG LVL3 (GOWN DISPOSABLE) ×6
GOWN STRL REUS W/TWL XL LVL3 (GOWN DISPOSABLE)
HEMOSTAT POWDER KIT SURGIFOAM (HEMOSTASIS) ×3 IMPLANT
KIT BASIN OR (CUSTOM PROCEDURE TRAY) ×3 IMPLANT
KIT ROOM TURNOVER OR (KITS) ×3 IMPLANT
LIQUID BAND (GAUZE/BANDAGES/DRESSINGS) ×3 IMPLANT
NDL HYPO 25X1 1.5 SAFETY (NEEDLE) ×1 IMPLANT
NDL SPNL 22GX3.5 QUINCKE BK (NEEDLE) ×1 IMPLANT
NEEDLE HYPO 25X1 1.5 SAFETY (NEEDLE) ×3 IMPLANT
NEEDLE SPNL 22GX3.5 QUINCKE BK (NEEDLE) ×3 IMPLANT
NS IRRIG 1000ML POUR BTL (IV SOLUTION) ×3 IMPLANT
PACK LAMINECTOMY NEURO (CUSTOM PROCEDURE TRAY) ×3 IMPLANT
PAD ARMBOARD 7.5X6 YLW CONV (MISCELLANEOUS) ×9 IMPLANT
PLATE 3 67.5XLCK NS SPNE CVD (Plate) IMPLANT
PLATE 3 ATLANTIS TRANS (Plate) ×3 IMPLANT
PLATE CLIPS (Plate) ×6 IMPLANT
RUBBERBAND STERILE (MISCELLANEOUS) ×6 IMPLANT
SCREW 4.0X13 (Screw) ×18 IMPLANT
SCREW BN 13X4XSLF DRL FXANG (Screw) IMPLANT
SCREW RESCUE 13MM (Screw) ×6 IMPLANT
SCREW RESCUE FIX ANGLE 13 (Screw) IMPLANT
SPONGE INTESTINAL PEANUT (DISPOSABLE) ×5 IMPLANT
SPONGE SURGIFOAM ABS GEL 100 (HEMOSTASIS) ×3 IMPLANT
STRIP CLOSURE SKIN 1/2X4 (GAUZE/BANDAGES/DRESSINGS) ×1 IMPLANT
SUT ETHILON 3 0 FSL (SUTURE) IMPLANT
SUT VIC AB 3-0 SH 8-18 (SUTURE) ×5 IMPLANT
SUT VICRYL 3-0 RB1 18 ABS (SUTURE) ×3 IMPLANT
SYR 20ML ECCENTRIC (SYRINGE) ×3 IMPLANT
TAPE CLOTH 2X10 TAN LF (GAUZE/BANDAGES/DRESSINGS) ×3 IMPLANT
TOWEL OR 17X24 6PK STRL BLUE (TOWEL DISPOSABLE) ×3 IMPLANT
TOWEL OR 17X26 10 PK STRL BLUE (TOWEL DISPOSABLE) ×3 IMPLANT
TRAY FOLEY CATH 16FRSI W/METER (SET/KITS/TRAYS/PACK) ×2 IMPLANT
WATER STERILE IRR 1000ML POUR (IV SOLUTION) ×3 IMPLANT

## 2014-04-19 NOTE — Anesthesia Preprocedure Evaluation (Addendum)
Anesthesia Evaluation  Patient identified by MRN, date of birth, ID band Patient awake    Reviewed: Allergy & Precautions, NPO status , Patient's Chart, lab work & pertinent test results  Airway Mallampati: II  TM Distance: >3 FB Neck ROM: Full    Dental no notable dental hx.    Pulmonary shortness of breath and with exertion, asthma , pneumonia -,  breath sounds clear to auscultation  Pulmonary exam normal       Cardiovascular hypertension, Pt. on home beta blockers and Pt. on medications + Past MI negative cardio ROS  Rhythm:Regular Rate:Normal     Neuro/Psych  Headaches, PSYCHIATRIC DISORDERS Anxiety    GI/Hepatic Neg liver ROS, GERD-  ,  Endo/Other  Hypothyroidism Morbid obesity  Renal/GU Renal disease     Musculoskeletal  (+) Arthritis -,   Abdominal   Peds  Hematology negative hematology ROS (+)   Anesthesia Other Findings   Reproductive/Obstetrics                             Anesthesia Physical Anesthesia Plan  ASA: III  Anesthesia Plan: General   Post-op Pain Management:    Induction: Intravenous  Airway Management Planned: Oral ETT  Additional Equipment:   Intra-op Plan:   Post-operative Plan: Extubation in OR  Informed Consent: I have reviewed the patients History and Physical, chart, labs and discussed the procedure including the risks, benefits and alternatives for the proposed anesthesia with the patient or authorized representative who has indicated his/her understanding and acceptance.   Dental advisory given  Plan Discussed with: CRNA  Anesthesia Plan Comments:         Anesthesia Quick Evaluation

## 2014-04-19 NOTE — Progress Notes (Signed)
Pt seen in PACU. Awake, alert. Following commands and moving all extremities well. Neck soft.

## 2014-04-19 NOTE — Anesthesia Procedure Notes (Signed)
Procedure Name: Intubation Date/Time: 04/19/2014 11:42 AM Performed by: Rebekah Chesterfield L Pre-anesthesia Checklist: Patient identified, Emergency Drugs available, Suction available, Patient being monitored and Timeout performed Patient Re-evaluated:Patient Re-evaluated prior to inductionOxygen Delivery Method: Circle system utilized Preoxygenation: Pre-oxygenation with 100% oxygen Intubation Type: IV induction and Cricoid Pressure applied Ventilation: Mask ventilation without difficulty Laryngoscope Size: Mac and 4 Grade View: Grade III Tube type: Oral Tube size: 8.0 mm Number of attempts: 1 Airway Equipment and Method: Stylet Placement Confirmation: positive ETCO2 and breath sounds checked- equal and bilateral Secured at: 23 cm Tube secured with: Tape Dental Injury: Teeth and Oropharynx as per pre-operative assessment

## 2014-04-19 NOTE — H&P (Signed)
CC:  Neck and arm pain  HPI: Mr. Gentles is a 69 year old male seen for initial consultation in the office. He comes in for a second opinion regarding his cervical disc disease. He says his symptoms began approximately 3 months ago primarily with posterior neck pain, and headache. There was not any identifiable inciting event. He describes a fairly sharp, relatively severe pain extending from the back of his neck up into the back of his head. In addition to this, he is also been having radiation of the pain along the right side of his neck, into the shoulder, and arm. He also describes tingling in both of his hands. He does not have any significant motor weakness of the arms that he has noticed. He does not have any balance problems, or difficulty walking. He has not suffered any falls. He does not have any changes in bladder function.   PMH: Past Medical History  Diagnosis Date  . Glaucoma     uses eye drops  . Hypertension     takes Lisinopril and Metoprolol daily  . Myocardial infarction >66yrs ago  . Asthma     bronchial  . Shortness of breath dyspnea     with exertion  . Pneumonia 50yrs ago  . Headache     r/t neck issues  . Arthritis   . Joint pain   . Chronic back pain   . Gout     on study medications for this  . GERD (gastroesophageal reflux disease)     takes Nexium daily as needed  . History of colon polyps     benign  . Diverticulosis     takes Bentyl daily as needed  . Kidney infection     beint treated at present with Amoxicillin   . Hypothyroidism     takes Synthroid daily  . Anxiety     takes Xanax daily as needed  . Insomnia     takes Triazolam nightly as needed  . History of shingles     PSH: Past Surgical History  Procedure Laterality Date  . Back surgery      x 2;fusion  . Tonsillectomy    . Eye surgery    . Cardiac catheterization  >76yrs ago  . Colonoscopy    . Esophagogastroduodenoscopy      SH: History  Substance Use Topics  .  Smoking status: Never Smoker   . Smokeless tobacco: Not on file     Comment: quit smoking in 2001  . Alcohol Use: No    MEDS: Prior to Admission medications   Medication Sig Start Date End Date Taking? Authorizing Provider  albuterol (PROVENTIL HFA;VENTOLIN HFA) 108 (90 BASE) MCG/ACT inhaler Inhale 2 puffs into the lungs every 6 (six) hours as needed for wheezing or shortness of breath.   Yes Historical Provider, MD  albuterol (PROVENTIL) (2.5 MG/3ML) 0.083% nebulizer solution Take 2.5 mg by nebulization every 6 (six) hours as needed for wheezing or shortness of breath.   Yes Historical Provider, MD  ALPRAZolam Duanne Moron) 0.5 MG tablet Take 0.5 mg by mouth every 8 (eight) hours as needed for anxiety.   Yes Historical Provider, MD  amoxicillin-clavulanate (AUGMENTIN) 875-125 MG per tablet Take 1 tablet by mouth 2 (two) times daily. 14 day course started 04/04/14 (completed another 14 day course on 04/01/14)   Yes Historical Provider, MD  chlorhexidine (HIBICLENS) 4 % external liquid Apply 1 application topically See admin instructions. Use as a head to toe body wash night before and  morning of procedure; avoid private parts and face   Yes Historical Provider, MD  dicyclomine (BENTYL) 10 MG capsule Take 10 mg by mouth 4 (four) times daily as needed (cramping).   Yes Historical Provider, MD  HYDROcodone-acetaminophen (NORCO) 10-325 MG per tablet Take 1 tablet by mouth 3 (three) times daily as needed (pain).   Yes Historical Provider, MD  latanoprost (XALATAN) 0.005 % ophthalmic solution Place 1 drop into both eyes at bedtime.   Yes Historical Provider, MD  levothyroxine (SYNTHROID, LEVOTHROID) 200 MCG tablet Take 200 mcg by mouth daily before breakfast.   Yes Historical Provider, MD  lisinopril-hydrochlorothiazide (PRINZIDE,ZESTORETIC) 20-12.5 MG per tablet Take 1 tablet by mouth daily.   Yes Historical Provider, MD  loratadine (CLARITIN) 10 MG tablet Take 10 mg by mouth daily as needed for allergies.    Yes Historical Provider, MD  metoprolol succinate (TOPROL-XL) 50 MG 24 hr tablet Take 50 mg by mouth daily. Take with or immediately following a meal.   Yes Historical Provider, MD  PRESCRIPTION MEDICATION Take 3 tablets by mouth daily. Gout study [id number](administered by Dr. [facility] [phone]) started 03/22/14 - pt takes 3 tablets each morning 1 each from 3 different bottles: 1st and 2nd bottles Febuxostat, Allopurinol or Placebo; 3rd bottle colchicine 0.6 mg   Yes Historical Provider, MD  triazolam (HALCION) 0.25 MG tablet Take 0.25 mg by mouth at bedtime as needed for sleep.   Yes Historical Provider, MD    ALLERGY: Allergies  Allergen Reactions  . Nasacort [Triamcinolone] Hives  . Other Rash    Reaction to unknown depression medication    ROS: ROS  NEUROLOGIC EXAM: Awake, alert, oriented Memory and concentration grossly intact Speech fluent, appropriate CN grossly intact Motor exam: Upper Extremities Deltoid Bicep Tricep Grip  Right 4+/5 4+/5 5/5 5/5  Left 5/5 5/5 5/5 5/5   Lower Extremity IP Quad PF DF EHL  Right 5/5 5/5 5/5 5/5 5/5  Left 5/5 5/5 5/5 5/5 5/5   Sensation grossly intact to LT  IMGAING: MRI of the cervical spine was reviewed. This demonstrates at C3 C4 a fairly broad-based disc osteophyte complex causing central stenosis and some deformity of the cervical spinal cord. There is also bilateral severe foraminal stenosis. At C4 C5, similar findings with mild spinal cord flattening, and relatively severe bilateral foraminal stenosis. At C5 C6 there is broad-based disc osteophyte complex with severe central stenosis, and severe bilateral foraminal stenosis. There does appear to be subtle intrinsic spinal cord T2 signal change at this level.  IMPRESSION: 69 year old man with multilevel cervical spondylotic myelopathy, especially at C3 C4, C4 C5, and C5 C6  PLAN: - Proceed with 3 level ACDF, from C3 C4, C4 C5, and C5 C6   I did review the MRI findings  with the patient in the office. Given the spinal cord compression and the patient's symptoms, I did recommend the above surgical decompression. The risks of surgery were discussed in detail with the patient which include but are not limited to spinal cord injury which may result in hand, leg, and bowel dysfunction, postoperative dysphagia, dysphonia, neck hematoma, or subsequent surgery for epidural hematoma. The risk of CSF leak was also discussed. In addition, I explained to him that after spinal fusion surgery, there is a risk of adjacent level disease requiring future surgical intervention. The patient understood our discussion as well as the risks of the surgery. All questions were answered.

## 2014-04-19 NOTE — Progress Notes (Signed)
Care of pt assumed by MA [facility] RN Right neck surgical site is sl. Puffy, soft to touch-unchanged from earlier per R. Hunt RN prev RN caregiver

## 2014-04-19 NOTE — Op Note (Signed)
PREOP DIAGNOSIS: Cervical Spondylosis with myelopathy, C3-4, C4-5, C5-6  POSTOP DIAGNOSIS: Same  PROCEDURE: 1. Discectomy at C3-4, C4-5, C5-6 for decompression of spinal cord and exiting nerve roots  2. Placement of intervertebral biomechanical device, Medtronic PTC PEEK cages at C3-4, C4-5, C5-6 3. Placement of anterior instrumentation consisting of interbody plate and screws spanning C3-C6: Medtronic Atlantis translational plate 4. Use of non-structural bone allograft  5. Arthrodesis C3-4, C4-5, C5-6, anterior interbody technique  6. Use of intraoperative microscope  SURGEON: Dr. Consuella Lose, MD  ASSISTANT: Dr. Kristeen Miss, MD  ANESTHESIA: General Endotracheal  EBL: 100cc  SPECIMENS: None  DRAINS: None  COMPLICATIONS: None immediate  CONDITION: Hemodynamically stable to PACU  HISTORY: Glenn Beck is a 69 y.o. man initially seen in the outpatient clinic with neck and arm pain, and signs consistent with myelopathy. He did have an MRI which demonstrated multilevel cervical spondylosis, with spinal cord compression and foraminal stenosis at C3-4, C4-5, and C5-6. With these findings, surgical decompression was indicated. The risks and benefits of the surgery were explained in detail to the patient and his family. After all questions were answered, informed consent was obtained.  PROCEDURE IN DETAIL: The patient was brought to the operating room and transferred to the operative table. After induction of general anesthesia, the patient was positioned on the operative table in the supine position with all pressure points meticulously padded. The skin of the neck was then prepped and draped in the usual sterile fashion.  After timeout was conducted, the skin was infiltrated with local anesthetic. Skin incision was then made sharply and Bovie electrocautery was used to dissect the subcutaneous tissue until the platysma was identified. The platysma was then divided and  undermined. The sternocleidomastoid muscle was then identified and, utilizing natural fascial planes in the neck, the prevertebral fascia was identified and the carotid sheath was retracted laterally and the trachea and esophagus retracted medially. Again using fluoroscopy, the correct disc spaces were identified. Bovie electrocautery was used to dissect in the subperiosteal plane and elevate the bilateral longus coli muscles. Self-retaining retractors were then placed. At this point, the microscope was draped and brought into the field, and the remainder of the case was done under the microscope using microdissecting technique.  Initially, a very large osteophyte was noted bridging the C3-4 interspace. This was removed using a combination of rongeurs and the high-speed drill. The C3-4 disc space was incised sharply and rongeurs were used to initially complete a discectomy. The high-speed drill was then used to complete discectomy until the posterior annulus was identified and removed and the posterior longitudinal ligament was identified. Using a nerve hook, the PLL was elevated, and Kerrison rongeurs were used to remove the posterior longitudinal ligament and the ventral thecal sac was identified. Using a combination of curettes and rongeurs, complete decompression of the thecal sac and exiting nerve roots at this level was completed, and verified using micro-nerve hook.  At this point, a 84m interbody cage was sized and packed with morcellized bone allograft. This was then inserted and tapped into place.  Attention was then turned to the C4-5 level. Again noted was an osteophyte bridging the disc space, which was removed with the high-speed drill and rongeurs. In a similar fashion, discectomy was completed initially with curettes and rongeurs, and completed with the drill. The PLL was again identified, elevated and incised. Using Kerrison rongeurs, decompression of the spinal cord and exiting roots at the  C4-5 level was completed and confirmed with  a dissector.  A 7 mm interbody cage was then sized and filled with bone allograft, and tapped into place. Position of the interbody devices was then confirmed with fluoroscopy.  Attention was then turned to the C5-6 level. Again, a very large osteophyte was covering the interspace. This was removed using the drill. In a similar fashion, discectomy was completed initially with curettes and rongeurs, and completed with the drill. The PLL was again identified, elevated and incised. Using Kerrison rongeurs, decompression of the spinal cord and exiting roots at the C5-6 level was completed and confirmed with a dissector. Of note, there was a tear amount of bony osteophytes both centrally, and in the foramen causing stenosis.  A 6 mm interbody cage was then sized and filled with bone allograft, and tapped into place. Position of the interbody devices was then confirmed with fluoroscopy.  After placement of the intervertebral devices, the anterior cervical plate was selected, and placed across the interspaces. Using a high-speed drill, the cortex of the cervical vertebral bodies was punctured, and screws inserted in the C3, C4, C5, C6 levels. Final fluoroscopic images in AP and lateral projections were taken to confirm good hardware placement.  At this point, after all counts were verified to be correct, meticulous hemostasis was secured using a combination of bipolar electrocautery and passive hemostatics. The platysma muscle was then closed using interrupted 3-0 Vicryl sutures, and the skin was closed with a interrupted subcuticular stitch. Sterile dressings were then applied and the drapes removed.  The patient tolerated the procedure well and was extubated in the room and taken to the postanesthesia care unit in stable condition.

## 2014-04-19 NOTE — Transfer of Care (Signed)
Immediate Anesthesia Transfer of Care Note  Patient: Glenn Beck  Procedure(s) Performed: Procedure(s): ANTERIOR CERVICAL DECOMPRESSION/DISCECTOMY FUSION CERVICAL THREE-FOUR CERVICAL FOUR-FIVE ,CERVICAL FIVE-SIX WITH INTERBODY PROTHESIS PLATING BONEGRAFT (N/A)  Patient Location: PACU  Anesthesia Type:General  Level of Consciousness: awake, alert , patient cooperative and confused  Airway & Oxygen Therapy: Patient Spontanous Breathing and Patient connected to nasal cannula oxygen  Post-op Assessment: Report given to RN, Post -op Vital signs reviewed and stable and Patient moving all extremities  Post vital signs: Reviewed and stable  Last Vitals:  Filed Vitals:   04/19/14 1630  BP: 170/71  Pulse: 101  Temp:   Resp: 19    Complications: No apparent anesthesia complications

## 2014-04-19 NOTE — Anesthesia Postprocedure Evaluation (Signed)
  Anesthesia Post-op Note  Patient: Glenn Beck  Procedure(s) Performed: Procedure(s): ANTERIOR CERVICAL DECOMPRESSION/DISCECTOMY FUSION CERVICAL THREE-FOUR CERVICAL FOUR-FIVE ,CERVICAL FIVE-SIX WITH INTERBODY PROTHESIS PLATING BONEGRAFT (N/A)  Patient Location: PACU  Anesthesia Type:General  Level of Consciousness: awake, alert  and oriented  Airway and Oxygen Therapy: Patient Spontanous Breathing and Patient connected to nasal cannula oxygen  Post-op Pain: mild  Post-op Assessment: Post-op Vital signs reviewed, Patient's Cardiovascular Status Stable, Respiratory Function Stable, Patent Airway and Pain level controlled  Post-op Vital Signs: stable  Last Vitals:  Filed Vitals:   04/19/14 1845  BP:   Pulse: 89  Temp:   Resp: 14    Complications: No apparent anesthesia complications

## 2014-04-20 MED ORDER — METHYLPREDNISOLONE (PAK) 4 MG PO TABS: ORAL_TABLET | ORAL | Status: DC

## 2014-04-20 MED ORDER — OXYCODONE-ACETAMINOPHEN 5-325 MG PO TABS: 1.0000 | ORAL_TABLET | ORAL | Status: DC | PRN

## 2014-04-20 MED ORDER — DEXAMETHASONE SODIUM PHOSPHATE 4 MG/ML IJ SOLN
4.0000 mg | Freq: Four times a day (QID) | INTRAMUSCULAR | Status: DC
  Administered 2014-04-20 – 2014-04-22 (×8): 4 mg via INTRAVENOUS
  Filled 2014-04-20 (×11): qty 1

## 2014-04-20 MED ORDER — DEXAMETHASONE SODIUM PHOSPHATE 10 MG/ML IJ SOLN
INTRAMUSCULAR | Status: AC
  Filled 2014-04-20: qty 1

## 2014-04-20 NOTE — Progress Notes (Signed)
Pt c/o increased difficulty swallowing his saliva. Incision is slightly swollen but is soft to touch. Dr. Kathyrn Sheriff notified, orders given. Will continue to monitor Pt. Holli Humbles, RN

## 2014-04-20 NOTE — Progress Notes (Signed)
Occupational Therapy Evaluation Patient Details Name: INRI SOBIESKI MRN: [id number] DOB: Nov 24, 1945 Today's Date: 04/20/2014    History of Present Illness 69 y.o. male s/p ANTERIOR CERVICAL DECOMPRESSION/DISCECTOMY FUSION CERVICAL THREE-FOUR CERVICAL FOUR-FIVE ,CERVICAL FIVE-SIX   Clinical Impression   PTA, pt independent with ADL and mobility. Began education regarding implementing cervical precautions during ADL/functional mobility with pt/family. Session limited due to pt c/o pain. Will plan to see in am prior to anticipated D/C.     Follow Up Recommendations  No OT follow up;Supervision/Assistance - 24 hour (initially)    Equipment Recommendations  None recommended by OT    Recommendations for Other Services       Precautions / Restrictions Precautions Precautions: Fall;Cervical Precaution Comments: handout issued Required Braces or Orthoses: Cervical Brace Cervical Brace: Hard collar Restrictions Weight Bearing Restrictions: No      Mobility Bed Mobility               General bed mobility comments: Sitting EOB (Educated on log rolling techniques. )  Transfers Overall transfer level: Needs assistance Equipment used: None   Sit to Stand: Min guard         General transfer comment: Min guard for safety. VC for hand placement. Moderate sway noted but able to self correct after staggering forward one step.    Balance     Sitting balance-Leahy Scale: Good       Standing balance-Leahy Scale: Fair                              ADL Overall ADL's : Needs assistance/impaired   Eating/Feeding Details (indicate cue type and reason): Pt having difficulty with swallowing. Only able to swallow small sips of water Grooming: Set up;Sitting   Upper Body Bathing: Minimal assitance;Sitting   Lower Body Bathing: Moderate assistance;Sit to/from stand   Upper Body Dressing : Minimal assistance;Sitting   Lower Body Dressing: Moderate  assistance;Sit to/from stand               Functional mobility during ADLs: Min guard (foe steady assist) General ADL Comments: Began education regarding compensatory techniques for ADL and functional mobility. Pt/family staets MD notified them that they could remove the collar when in bed but that he needs to have it on when walking adn when OOB. No orders in chart. Educated  Programme researcher, broadcasting/film/video on donning and adjusting collar appropriately. Reviewed cervical precautions. Family plans to assist pt with ADL and as needed after d/c.                      Pertinent Vitals/Pain Pain Assessment: 0-10 Pain Score: 7  Pain Location: neck Pain Descriptors / Indicators: Aching;Discomfort;Grimacing;Nagging     Hand Dominance Right   Extremity/Trunk Assessment Upper Extremity Assessment Upper Extremity Assessment: Generalized weakness   Lower Extremity Assessment Lower Extremity Assessment: Defer to PT evaluation   Cervical / Trunk Assessment Cervical / Trunk Assessment: Other exceptions;Normal Cervical / Trunk Exceptions: With hard collar   Communication Communication Communication: No difficulties   Cognition Arousal/Alertness: Awake/alert Behavior During Therapy: WFL for tasks assessed/performed Overall Cognitive Status: Within Functional Limits for tasks assessed                     General Comments       Exercises       Shoulder Instructions      Home Living Family/patient expects to be discharged to:: Private  residence Living Arrangements: Other relatives Available Help at Discharge: Family;Available 24 hours/day (nephew - 24hr/day for a few days, then PRN) Type of Home: House Home Access: Stairs to enter [employer] of Steps: 1 Entrance Stairs-Rails: None Home Layout: One level     Bathroom Shower/Tub: Teacher, early years/pre: Handicapped height Bathroom Accessibility: Yes How Accessible: Accessible via walker Home  Equipment: [place] - 2 wheels;Cane - single point;Shower seat;Grab bars - toilet;Grab bars - tub/shower          Prior Functioning/Environment Level of Independence: Independent             OT Diagnosis: Generalized weakness;Acute pain   OT Problem List: Decreased strength;Decreased activity tolerance;Decreased knowledge of precautions;Decreased knowledge of use of DME or AE;Obesity;Impaired UE functional use;Pain;Increased edema   OT Treatment/Interventions: Self-care/ADL training;DME and/or AE instruction;Therapeutic activities;Patient/family education    OT Goals(Current goals can be found in the care plan section) Acute Rehab OT Goals Patient Stated Goal: Go home OT Goal Formulation: With patient Time For Goal Achievement: 04/27/14 Potential to Achieve Goals: Good ADL Goals Additional ADL Goal #1: Pt/family will verbalize understanding of compensatory techniques for ADL following back precautions Additional ADL Goal #2: Pt/family will be independent on donning/doffing ASpen collar Additional ADL Goal #3: pt will verbalize understanding of cervial precautions.  OT Frequency: Min 2X/week   Barriers to D/C:            Co-evaluation              End of Session Equipment Utilized During Treatment: Cervical collar Nurse Communication: Mobility status  Activity Tolerance: Patient limited by pain Patient left: in bed;with call bell/phone within reach;with family/visitor present   Time: 6861-6837 OT Time Calculation (min): 20 min Charges:  OT General Charges $OT Visit: 1 Procedure OT Evaluation $Initial OT Evaluation Tier I: 1 Procedure G-Codes:    Darrio Bade,HILLARY 04-27-2014, 2:54 PM   [facility], OTR/L  [phone] 2014-04-27

## 2014-04-20 NOTE — Progress Notes (Signed)
OT Cancellation Note  Patient Details Name: Glenn Beck MRN: [id number] DOB: [birth year]   Cancelled Treatment:    Reason Eval/Treat Not Completed: Other (comment) Attempted to see this am. Pt with increaed c/o swallowing. Given morphine. Will return this pm if able.  [place], OTR/L  [phone] 04/20/2014 04/20/2014, 1:06 PM

## 2014-04-20 NOTE — Progress Notes (Signed)
No issues overnight. Pt c/o difficulty swallowing. Able to drink some sips of water.   EXAM:  BP 130/78 mmHg  Pulse 79  Temp(Src) 98.3 F (36.8 C) (Oral)  Resp 18  Wt 127.461 kg (281 lb)  SpO2 96%  Awake, alert, oriented  Speech fluent, appropriate  CN grossly intact  5/5 LUE, BLE 4+/5 right delt/bicep (baseline)   IMPRESSION:  69 y.o. male POD#1 s/p C3-4, C4-5, C5-6 ACDF with dysphagia, otherwise at baseline  PLAN: - Cont IV decadron - Observe for another day, if swallowing improves, may be able to go home tomorrow.with steroids.

## 2014-04-20 NOTE — Progress Notes (Signed)
Utilization review completed.  

## 2014-04-20 NOTE — Evaluation (Signed)
Physical Therapy Evaluation Patient Details Name: JUANITA DEVINCENT MRN: [id number] DOB: 08-Aug-1945 Today's Date: 04/20/2014   History of Present Illness  69 y.o. male s/p ANTERIOR CERVICAL DECOMPRESSION/DISCECTOMY FUSION CERVICAL THREE-FOUR CERVICAL FOUR-FIVE ,CERVICAL FIVE-SIX  Clinical Impression  Patient is s/p above surgery presenting today with functional limitations due to the deficits listed below (see PT Problem List). Demonstrates mild balance impairments, apparently more prevalent at this time than from baseline. Reaches for rail in hallway at times while ambulating. States his nephew will provide 24 hour care for the first several days at home, then will be available PRN. Also reports he owns a rolling walker which I have encouraged him to use at all times for safety. His only complaint at this time is difficulty swallowing. RN aware. Patient will benefit from skilled PT to increase their independence and safety with mobility to allow discharge to the venue listed below.      Follow Up Recommendations Home health PT;Supervision for mobility/OOB    Equipment Recommendations  None recommended by PT    Recommendations for Other Services OT consult     Precautions / Restrictions Precautions Precautions: Fall;Cervical Required Braces or Orthoses: Cervical Brace Cervical Brace: Hard collar Restrictions Weight Bearing Restrictions: No      Mobility  Bed Mobility               General bed mobility comments: Sitting EOB  Transfers Overall transfer level: Needs assistance Equipment used: None Transfers: Sit to/from Stand Sit to Stand: Min guard         General transfer comment: Min guard for safety. VC for hand placement. Moderate sway noted but able to self correct after staggering forward one step.  Ambulation/Gait Ambulation/Gait assistance: Min guard Ambulation Distance (Feet): 110 Feet Assistive device: None Gait Pattern/deviations: Step-through  pattern;Decreased step length - left;Decreased stance time - left;Decreased stride length;Staggering left;Staggering right;Wide base of support (RLE externally rotated) Gait velocity: decreased   General Gait Details: Reaches for rail intermittently in hallway with mild staggering noted. Close guard for safety. Cues for safety and awareness of surroundings.  Stairs            Wheelchair Mobility    Modified Rankin (Stroke Patients Only)       Balance Overall balance assessment: Needs assistance Sitting-balance support: No upper extremity supported;Feet supported Sitting balance-Leahy Scale: Good     Standing balance support: No upper extremity supported Standing balance-Leahy Scale: Fair                               Pertinent Vitals/Pain Pain Assessment: 0-10 Pain Score:  ("I just can't swallow" no value given - and my back is sore) Pain Location: back Pain Descriptors / Indicators: Sore Pain Intervention(s): Monitored during session;Repositioned    Home Living Family/patient expects to be discharged to:: Private residence Living Arrangements: Other relatives Available Help at Discharge: Family;Available 24 hours/day (nephew - 24hr/day for a few days, then PRN) Type of Home: House Home Access: Stairs to enter Entrance Stairs-Rails: None Entrance Stairs-Number of Steps: 1 Home Layout: One level Home Equipment: Walker - 2 wheels;Cane - single point;Shower seat      Prior Function Level of Independence: Independent               Hand Dominance        Extremity/Trunk Assessment   Upper Extremity Assessment: Defer to OT evaluation  Lower Extremity Assessment: Generalized weakness      Cervical / Trunk Assessment: Other exceptions  Communication   Communication: No difficulties  Cognition Arousal/Alertness: Awake/alert Behavior During Therapy: WFL for tasks assessed/performed Overall Cognitive Status: Within Functional  Limits for tasks assessed                      General Comments General comments (skin integrity, edema, etc.): States his nephew will be staying with him 24 hours a day for the first several days.    Exercises        Assessment/Plan    PT Assessment Patient needs continued PT services  PT Diagnosis Difficulty walking;Abnormality of gait;Generalized weakness;Acute pain   PT Problem List Decreased strength;Decreased activity tolerance;Decreased balance;Decreased mobility;Decreased knowledge of use of DME;Decreased knowledge of precautions;Pain  PT Treatment Interventions DME instruction;Gait training;Stair training;Functional mobility training;Therapeutic activities;Therapeutic exercise;Balance training;Neuromuscular re-education;Patient/family education;Modalities   PT Goals (Current goals can be found in the Care Plan section) Acute Rehab PT Goals Patient Stated Goal: Go home PT Goal Formulation: With patient Time For Goal Achievement: 05/04/14 Potential to Achieve Goals: Good    Frequency Min 5X/week   Barriers to discharge        Co-evaluation               End of Session Equipment Utilized During Treatment: Gait belt Activity Tolerance: Patient tolerated treatment well Patient left: in bed;with call bell/phone within reach (sitting EOB) Nurse Communication: Mobility status         Time: 7282-0601 PT Time Calculation (min) (ACUTE ONLY): 13 min   Charges:   PT Evaluation $Initial PT Evaluation Tier I: 1 Procedure     PT G CodesEllouise Newer 04/20/2014, 9:22 AM Elayne Snare, [place]

## 2014-04-21 ENCOUNTER — Encounter (HOSPITAL_COMMUNITY): Payer: Self-pay | Admitting: Neurosurgery

## 2014-04-21 NOTE — Progress Notes (Signed)
Filed Vitals:   04/20/14 2026 04/21/14 0002 04/21/14 0415 04/21/14 0826  BP: 136/85 96/51 121/72 136/72  Pulse: 84 66 77 74  Temp: 98.3 F (36.8 C) 98.3 F (36.8 C) 98.7 F (37.1 C) 97.6 F (36.4 C)  TempSrc: Oral Oral Oral Oral  Resp: 18 18 18 18   Weight:      SpO2: 97% 95% 95% 98%    CBC  Recent Labs  04/19/14 2109  WBC 9.5  HGB 14.4  HCT 43.1  PLT 176   BMET  Recent Labs  04/19/14 2109  CREATININE 1.21    Patient sitting up in chair, working with OT. Continuing to have significant dysphagia. Feels that he is able to keep up with liquids, but has significant difficulties with solids. Neck with moderate swelling beneath incision. The incision itself is clean and dry. Staff and patient note that swelling has been present since surgery, and is not worse as compared to yesterday. Patient denies any dyspnea or shortness of breath. Does report chronic swelling in distal lower extremities, left worse than right. Has been ambulating in the halls, using a rolling walker. Continues on Decadron 4 mg IV every 6 hours.  Plan: Swelling underlying incision and dysphasia significant enough to warrant continued in-hospital care. We'll need to monitor adequacy of the oh intake and continue Decadron IV. Patient encouraged to ambulate at least 4 times a day in the halls with a rolling walker.  Hosie Spangle, MD 04/21/2014, 9:59 AM

## 2014-04-21 NOTE — Progress Notes (Signed)
Occupational Therapy Treatment Patient Details Name: Glenn Beck MRN: [id number] DOB: Dec 25, 1945 Today's Date: 04/21/2014    History of present illness 69 y.o. male s/p ANTERIOR CERVICAL DECOMPRESSION/DISCECTOMY FUSION CERVICAL THREE-FOUR CERVICAL FOUR-FIVE ,CERVICAL FIVE-SIX   OT comments  All education completed. Pt verbally demonstrates understanding of cervical precautions and managemtn of ADL and functional mobility. Pt will have 24/7 S after D/C as needed. Pt mobilizing well. Pt states he continues to have difficulty swallowing. MD/Nsg aware. Ready for D/C home when medically stable. Encouraged pt to ambulate with nsg staff. Ot signing off.   Follow Up Recommendations  No OT follow up;Supervision/Assistance - 24 hour    Equipment Recommendations  None recommended by OT    Recommendations for Other Services      Precautions / Restrictions Precautions Precautions: Fall;Cervical Precaution Comments: reviewed precautions Required Braces or Orthoses: Cervical Brace Cervical Brace: Hard collar       Mobility Bed Mobility Overal bed mobility: Modified Independent                Transfers Overall transfer level: Modified independent                    Balance Overall balance assessment: Needs assistance         Standing balance support: During functional activity Standing balance-Leahy Scale: Fair                     ADL                                         General ADL Comments: Completed education regarding compensatory techniques for ADL with use of AE/DME. Pt staes he has a reacher that he can use for dressing. Educated on Insurance account manager for [employer] ADL. Also educated on use of tongs for hygiene after toileting if needed. Pt verbalized understanding. Pt staes his cousin will be with him 24/7 and can assist as needed. Reviewed donning/doffing of ASPEN collar. Discussed need to be OOB in chair for strengthening and  for decreasing neck edema. Pt verbalized understanding. Discussed precautions for mobilityalso and pt verbalized understanding. Currently using a RW to reduce risk of falls. Pt has RW at home.                                      Cognition   Behavior During Therapy: WFL for tasks assessed/performed Overall Cognitive Status: Within Functional Limits for tasks assessed                                                       Pertinent Vitals/ Pain       Pain Assessment: 0-10 Pain Score: 5  Pain Location: neck Pain Descriptors / Indicators: Aching Pain Intervention(s): Limited activity within patient's tolerance;Monitored during session  Frequency   D/C OT    Progress Toward Goals  OT Goals(current goals can now be found in the care plan section)  Progress towards OT goals: Goals met/education completed, patient discharged from OT  Acute Rehab OT Goals Patient Stated Goal: Go home OT Goal Formulation: With patient Time For Goal Achievement: 04/27/14 Potential to Achieve Goals: Good ADL Goals Additional ADL Goal #1: Pt/family will verbalize understanding of compensatory techniques for ADL following back precautions Additional ADL Goal #2: Pt/family will be independent on donning/doffing ASpen collar Additional ADL Goal #3: pt will verbalize understanding of cervial precautions.  Plan Discharge plan remains appropriate    Co-evaluation                 End of Session Equipment Utilized During Treatment: Cervical collar   Activity Tolerance Patient tolerated treatment well   Patient Left in chair;with call bell/phone within reach   Nurse Communication Mobility status        Time: 0946-1000 OT Time Calculation (min): 14 min  Charges: OT General Charges $OT Visit: 1 Procedure OT Treatments $Self Care/Home Management : 8-22  mins  Grayson Pfefferle,HILLARY 04/21/2014, 11:11 AM   Maurie Boettcher, OTR/L  [phone] 04/21/2014

## 2014-04-21 NOTE — Progress Notes (Signed)
Physical Therapy Treatment Patient Details Name: Glenn Beck MRN: [id number] DOB: November 21, 1945 Today's Date: 04/21/2014    History of Present Illness 69 y.o. male s/p ANTERIOR CERVICAL DECOMPRESSION/DISCECTOMY FUSION CERVICAL THREE-FOUR CERVICAL FOUR-FIVE ,CERVICAL FIVE-SIX    PT Comments    Patient has achieved PT goals.  Safe with ambulation with RW.  Able to negotiate stairs with supervision.  Encouraged ambulation in hallway with nursing.  No further acute PT needs identified - PT will sign off.  Follow Up Recommendations  No PT follow up;Supervision for mobility/OOB     Equipment Recommendations  None recommended by PT    Recommendations for Other Services       Precautions / Restrictions Precautions Precautions: Fall;Cervical Precaution Comments: reviewed precautions Required Braces or Orthoses: Cervical Brace Cervical Brace: Hard collar Restrictions Weight Bearing Restrictions: No    Mobility  Bed Mobility Overal bed mobility: Modified Independent                Transfers Overall transfer level: Modified independent Equipment used: None Transfers: Sit to/from Stand Sit to Stand: Modified independent (Device/Increase time)            Ambulation/Gait Ambulation/Gait assistance: Modified independent (Device/Increase time) Ambulation Distance (Feet): 180 Feet Assistive device: Rolling walker (2 wheeled) (patient preference) Gait Pattern/deviations: Step-through pattern;Decreased stride length Gait velocity: decreased Gait velocity interpretation: Below normal speed for age/gender General Gait Details: Patient demonstrates safe use of RW.  Good balance with RW.   Stairs Stairs: Yes Stairs assistance: Supervision Stair Management: One rail Right;Step to pattern;Forwards Number of Stairs: 3 General stair comments: Verbal cues for step-to pattern.  Patient able to complete with supervision only for safety.  Wheelchair Mobility    Modified  Rankin (Stroke Patients Only)       Balance Overall balance assessment: Needs assistance         Standing balance support: During functional activity Standing balance-Leahy Scale: Fair                      Cognition Arousal/Alertness: Awake/alert Behavior During Therapy: WFL for tasks assessed/performed Overall Cognitive Status: Within Functional Limits for tasks assessed                      Exercises      General Comments        Pertinent Vitals/Pain Pain Assessment: 0-10 Pain Score: 0-No pain Pain Location: neck Pain Descriptors / Indicators: Aching Pain Intervention(s): Limited activity within patient's tolerance;Monitored during session    Home Living                      Prior Function            PT Goals (current goals can now be found in the care plan section) Acute Rehab PT Goals Patient Stated Goal: Go home Progress towards PT goals: Progressing toward goals    Frequency  Min 5X/week    PT Plan Discharge plan needs to be updated    Co-evaluation             End of Session Equipment Utilized During Treatment: Gait belt Activity Tolerance: Patient tolerated treatment well Patient left: in chair;with call bell/phone within reach;with family/visitor present     Time: 1054-1110 PT Time Calculation (min) (ACUTE ONLY): 16 min  Charges:  $Gait Training: 8-22 mins                    G  Codes:      Despina Pole 04/21/2014, 11:44 AM Carita Pian. Sanjuana Kava, [place] Pager [phone]

## 2014-04-22 MED ORDER — HYDROMORPHONE HCL 2 MG PO TABS
2.0000 mg | ORAL_TABLET | ORAL | Status: DC | PRN
  Administered 2014-04-22 – 2014-04-23 (×3): 2 mg via ORAL
  Filled 2014-04-22 (×3): qty 1

## 2014-04-22 MED ORDER — DEXAMETHASONE 4 MG PO TABS
4.0000 mg | ORAL_TABLET | Freq: Four times a day (QID) | ORAL | Status: DC
  Administered 2014-04-22 – 2014-04-23 (×4): 4 mg via ORAL
  Filled 2014-04-22 (×5): qty 1

## 2014-04-22 MED ORDER — SENNOSIDES-DOCUSATE SODIUM 8.6-50 MG PO TABS
1.0000 | ORAL_TABLET | Freq: Two times a day (BID) | ORAL | Status: DC
  Filled 2014-04-22 (×3): qty 1

## 2014-04-22 MED ORDER — BISACODYL 10 MG RE SUPP
10.0000 mg | Freq: Every day | RECTAL | Status: DC | PRN
  Administered 2014-04-22: 10 mg via RECTAL
  Filled 2014-04-22: qty 1

## 2014-04-22 NOTE — Progress Notes (Signed)
Filed Vitals:   04/21/14 2006 04/21/14 2347 04/22/14 0400 04/22/14 0751  BP: 125/76 122/72 129/77 113/62  Pulse: 76 67 66 68  Temp: 98.2 F (36.8 C) 98.2 F (36.8 C) 97.7 F (36.5 C) 98.2 F (36.8 C)  TempSrc: Oral Oral Oral Oral  Resp: 20 20 20 18   Weight:      SpO2: 96% 98% 98% 99%    CBC  Recent Labs  04/19/14 2109  WBC 9.5  HGB 14.4  HCT 43.1  PLT 176   BMET  Recent Labs  04/19/14 2109  CREATININE 1.21    Patient resting comfortably in bed. Dressing removed by nursing staff, swelling underlying incision seems to have decreased some. Mild ecchymosis in the adjacent soft tissues, no erythema or drainage. Patient reports that he continues to swallow liquids adequately, but still has some difficulties with solids. Denies any dyspnea or shortness of breath. Was able to take his blood pressure pills yesterday.  Has been receiving morphine IV and Decadron IV.  Plan: Will transition to oral analgesic and oral Decadron and monitor tolerance.   Hosie Spangle, MD 04/22/2014, 8:15 AM

## 2014-04-23 NOTE — Progress Notes (Signed)
Patient alert and oriented, mae's well, voiding adequate amount of urine, swallowing without difficulty, incision area clean, dry and intact,with  no c/o pain. Patient discharged home with family. Script and discharged instructions given to patient. Patient and family stated understanding of d/c instructions given and has an appointment with MD.

## 2014-04-23 NOTE — Discharge Summary (Signed)
Physician Discharge Summary  Patient ID: Glenn Beck MRN: [id number] DOB/AGE: 06-10-1945 69 y.o.  Admit date: 04/19/2014 Discharge date: 04/23/2014  Admission Diagnoses: Cervical spondylosis    Discharge Diagnoses: Same   Discharged Condition: good  Hospital Course: The patient was admitted on 04/19/2014 and taken to the operating room where the patient underwent ACDF. The patient tolerated the procedure well and was taken to the recovery room and then to the floor in stable condition. The hospital course was routine except for postoperative wound swelling. He was started on Decadron and had improvement in his dysphagia. There were no complications. The wound remained clean dry and intact. Pt had appropriate neck soreness. No complaints of arm pain or new N/T/W. The patient remained afebrile with stable vital signs, and tolerated a regular diet. The patient continued to increase activities, and pain was well controlled with oral pain medications.   Consults: None  Significant Diagnostic Studies:  Results for orders placed or performed during the hospital encounter of 04/19/14  CBC  Result Value Ref Range   WBC 9.5 4.0 - 10.5 K/uL   RBC 4.61 4.22 - 5.81 MIL/uL   Hemoglobin 14.4 13.0 - 17.0 g/dL   HCT 43.1 39.0 - 52.0 %   MCV 93.5 78.0 - 100.0 fL   MCH 31.2 26.0 - 34.0 pg   MCHC 33.4 30.0 - 36.0 g/dL   RDW 13.5 11.5 - 15.5 %   Platelets 176 150 - 400 K/uL  Creatinine, serum  Result Value Ref Range   Creatinine, Ser 1.21 0.50 - 1.35 mg/dL   GFR calc non Af Amer 59 (L) >90 mL/min   GFR calc Af Amer 68 (L) >90 mL/min    Dg Cervical Spine 2-3 Views  04/19/2014   CLINICAL DATA:  Cervical fusion  EXAM: CERVICAL SPINE - 2-3 VIEW; DG C-ARM 61-120 MIN  COMPARISON:  None.  FINDINGS: Two spot films were obtained intraoperatively and reveal anterior cervical fusion from C3-C6. 12 seconds of fluoroscopy was utilized during this exam. No acute abnormality is noted.  IMPRESSION: Cervical fusion  from C3-C6.   Electronically Signed   By: Inez Catalina M.D.   On: 04/19/2014 16:37   Dg C-arm 1-60 Min  04/19/2014   CLINICAL DATA:  Cervical fusion  EXAM: CERVICAL SPINE - 2-3 VIEW; DG C-ARM 61-120 MIN  COMPARISON:  None.  FINDINGS: Two spot films were obtained intraoperatively and reveal anterior cervical fusion from C3-C6. 12 seconds of fluoroscopy was utilized during this exam. No acute abnormality is noted.  IMPRESSION: Cervical fusion from C3-C6.   Electronically Signed   By: Inez Catalina M.D.   On: 04/19/2014 16:37    Antibiotics:  Anti-infectives    Start     Dose/Rate Route Frequency Ordered Stop   04/19/14 2330  ceFAZolin (ANCEF) IVPB 1 g/50 mL premix     1 g 100 mL/hr over 30 Minutes Intravenous Every 8 hours 04/19/14 1959 04/20/14 0652   04/19/14 1130  bacitracin 50,000 Units in sodium chloride irrigation 0.9 % 500 mL irrigation  Status:  Discontinued       As needed 04/19/14 1324 04/19/14 1625   04/19/14 0600  ceFAZolin (ANCEF) 3 g in dextrose 5 % 50 mL IVPB     3 g 160 mL/hr over 30 Minutes Intravenous On call to O.R. 04/18/14 1317 04/19/14 1600      Discharge Exam: Blood pressure 122/78, pulse 71, temperature 97.7 F (36.5 C), temperature source Oral, resp. rate 18, weight 281 lb (  127.461 kg), SpO2 100 %. Neurologic: Grossly normal Incision looks okay with improved swelling though there is some ecchymosis  Discharge Medications:     Medication List    STOP taking these medications        HYDROcodone-acetaminophen 10-325 MG per tablet  Commonly known as:  NORCO      TAKE these medications        albuterol (2.5 MG/3ML) 0.083% nebulizer solution  Commonly known as:  PROVENTIL  Take 2.5 mg by nebulization every 6 (six) hours as needed for wheezing or shortness of breath.     albuterol 108 (90 BASE) MCG/ACT inhaler  Commonly known as:  PROVENTIL HFA;VENTOLIN HFA  Inhale 2 puffs into the lungs every 6 (six) hours as needed for wheezing or shortness of breath.      ALPRAZolam 0.5 MG tablet  Commonly known as:  XANAX  Take 0.5 mg by mouth every 8 (eight) hours as needed for anxiety.     amoxicillin-clavulanate 875-125 MG per tablet  Commonly known as:  AUGMENTIN  Take 1 tablet by mouth 2 (two) times daily. 14 day course started 04/04/14 (completed another 14 day course on 04/01/14)     chlorhexidine 4 % external liquid  Commonly known as:  HIBICLENS  Apply 1 application topically See admin instructions. Use as a head to toe body wash night before and morning of procedure; avoid private parts and face     dicyclomine 10 MG capsule  Commonly known as:  BENTYL  Take 10 mg by mouth 4 (four) times daily as needed (cramping).     latanoprost 0.005 % ophthalmic solution  Commonly known as:  XALATAN  Place 1 drop into both eyes at bedtime.     levothyroxine 200 MCG tablet  Commonly known as:  SYNTHROID, LEVOTHROID  Take 200 mcg by mouth daily before breakfast.     lisinopril-hydrochlorothiazide 20-12.5 MG per tablet  Commonly known as:  PRINZIDE,ZESTORETIC  Take 1 tablet by mouth daily.     loratadine 10 MG tablet  Commonly known as:  CLARITIN  Take 10 mg by mouth daily as needed for allergies.     methylPREDNIsolone 4 MG tablet  Commonly known as:  MEDROL DOSPACK  follow package directions     metoprolol succinate 50 MG 24 hr tablet  Commonly known as:  TOPROL-XL  Take 50 mg by mouth daily. Take with or immediately following a meal.     oxyCODONE-acetaminophen 5-325 MG per tablet  Commonly known as:  PERCOCET/ROXICET  Take 1-2 tablets by mouth every 4 (four) hours as needed for moderate pain.     PRESCRIPTION MEDICATION  Take 3 tablets by mouth daily. Gout study [id number](administered by Dr. C[facility] [phone]) started 03/22/14 - pt takes 3 tablets each morning 1 each from 3 different bottles: 1st and 2nd bottles Febuxostat, Allopurinol or Placebo; 3rd bottle colchicine 0.6 mg     triazolam 0.25 MG tablet  Commonly known as:   HALCION  Take 0.25 mg by mouth at bedtime as needed for sleep.        Disposition: Home   Final Dx: ACDF      Discharge Instructions    Call MD for:  difficulty breathing, headache or visual disturbances    Complete by:  As directed      Call MD for:  persistant nausea and vomiting    Complete by:  As directed      Call MD for:  redness, tenderness, or signs of infection (  pain, swelling, redness, odor or green/yellow discharge around incision site)    Complete by:  As directed      Call MD for:  severe uncontrolled pain    Complete by:  As directed      Call MD for:  temperature >100.4    Complete by:  As directed      Diet - low sodium heart healthy    Complete by:  As directed      Discharge instructions    Complete by:  As directed   No strenuous activity, no heavy lifting, no driving     Increase activity slowly    Complete by:  As directed               Signed: Reniyah Gootee S 04/23/2014, 8:43 AM

## 2014-05-03 ENCOUNTER — Encounter (HOSPITAL_COMMUNITY): Payer: Self-pay | Admitting: Neurosurgery

## 2014-05-09 DIAGNOSIS — M4712 Other spondylosis with myelopathy, cervical region: Secondary | ICD-10-CM | POA: Diagnosis not present

## 2014-05-11 DIAGNOSIS — E785 Hyperlipidemia, unspecified: Secondary | ICD-10-CM | POA: Diagnosis not present

## 2014-05-11 DIAGNOSIS — I2510 Atherosclerotic heart disease of native coronary artery without angina pectoris: Secondary | ICD-10-CM | POA: Diagnosis not present

## 2014-05-11 DIAGNOSIS — N390 Urinary tract infection, site not specified: Secondary | ICD-10-CM | POA: Diagnosis not present

## 2014-05-11 DIAGNOSIS — E039 Hypothyroidism, unspecified: Secondary | ICD-10-CM | POA: Diagnosis not present

## 2014-05-11 DIAGNOSIS — K210 Gastro-esophageal reflux disease with esophagitis: Secondary | ICD-10-CM | POA: Diagnosis not present

## 2014-05-21 DIAGNOSIS — E039 Hypothyroidism, unspecified: Secondary | ICD-10-CM | POA: Diagnosis not present

## 2014-05-21 DIAGNOSIS — I2510 Atherosclerotic heart disease of native coronary artery without angina pectoris: Secondary | ICD-10-CM | POA: Diagnosis not present

## 2014-05-21 DIAGNOSIS — N400 Enlarged prostate without lower urinary tract symptoms: Secondary | ICD-10-CM | POA: Diagnosis not present

## 2014-05-21 DIAGNOSIS — N390 Urinary tract infection, site not specified: Secondary | ICD-10-CM | POA: Diagnosis not present

## 2014-05-21 DIAGNOSIS — E785 Hyperlipidemia, unspecified: Secondary | ICD-10-CM | POA: Diagnosis not present

## 2014-05-21 DIAGNOSIS — K210 Gastro-esophageal reflux disease with esophagitis: Secondary | ICD-10-CM | POA: Diagnosis not present

## 2014-05-25 DIAGNOSIS — H4011X1 Primary open-angle glaucoma, mild stage: Secondary | ICD-10-CM | POA: Diagnosis not present

## 2014-06-22 DIAGNOSIS — I10 Essential (primary) hypertension: Secondary | ICD-10-CM | POA: Diagnosis not present

## 2014-06-22 DIAGNOSIS — I2510 Atherosclerotic heart disease of native coronary artery without angina pectoris: Secondary | ICD-10-CM | POA: Diagnosis not present

## 2014-06-22 DIAGNOSIS — Z9181 History of falling: Secondary | ICD-10-CM | POA: Diagnosis not present

## 2014-06-22 DIAGNOSIS — E039 Hypothyroidism, unspecified: Secondary | ICD-10-CM | POA: Diagnosis not present

## 2014-06-22 DIAGNOSIS — M159 Polyosteoarthritis, unspecified: Secondary | ICD-10-CM | POA: Diagnosis not present

## 2014-06-22 DIAGNOSIS — E785 Hyperlipidemia, unspecified: Secondary | ICD-10-CM | POA: Diagnosis not present

## 2014-07-20 DIAGNOSIS — N390 Urinary tract infection, site not specified: Secondary | ICD-10-CM | POA: Diagnosis not present

## 2014-07-20 DIAGNOSIS — W57XXXA Bitten or stung by nonvenomous insect and other nonvenomous arthropods, initial encounter: Secondary | ICD-10-CM | POA: Diagnosis not present

## 2014-07-20 DIAGNOSIS — I2510 Atherosclerotic heart disease of native coronary artery without angina pectoris: Secondary | ICD-10-CM | POA: Diagnosis not present

## 2014-07-20 DIAGNOSIS — E785 Hyperlipidemia, unspecified: Secondary | ICD-10-CM | POA: Diagnosis not present

## 2014-07-20 DIAGNOSIS — E039 Hypothyroidism, unspecified: Secondary | ICD-10-CM | POA: Diagnosis not present

## 2014-08-05 DIAGNOSIS — M1711 Unilateral primary osteoarthritis, right knee: Secondary | ICD-10-CM | POA: Diagnosis not present

## 2014-08-09 DIAGNOSIS — L219 Seborrheic dermatitis, unspecified: Secondary | ICD-10-CM | POA: Diagnosis not present

## 2014-08-09 DIAGNOSIS — L820 Inflamed seborrheic keratosis: Secondary | ICD-10-CM | POA: Diagnosis not present

## 2014-08-15 DIAGNOSIS — J342 Deviated nasal septum: Secondary | ICD-10-CM | POA: Diagnosis not present

## 2014-08-15 DIAGNOSIS — Z9889 Other specified postprocedural states: Secondary | ICD-10-CM | POA: Diagnosis not present

## 2014-08-15 DIAGNOSIS — R0989 Other specified symptoms and signs involving the circulatory and respiratory systems: Secondary | ICD-10-CM | POA: Diagnosis not present

## 2014-08-22 DIAGNOSIS — R1310 Dysphagia, unspecified: Secondary | ICD-10-CM | POA: Diagnosis not present

## 2014-08-25 DIAGNOSIS — R1310 Dysphagia, unspecified: Secondary | ICD-10-CM | POA: Diagnosis not present

## 2014-08-29 DIAGNOSIS — R1310 Dysphagia, unspecified: Secondary | ICD-10-CM | POA: Diagnosis not present

## 2014-09-05 DIAGNOSIS — M4712 Other spondylosis with myelopathy, cervical region: Secondary | ICD-10-CM | POA: Diagnosis not present

## 2014-09-05 DIAGNOSIS — Z6838 Body mass index (BMI) 38.0-38.9, adult: Secondary | ICD-10-CM | POA: Diagnosis not present

## 2014-09-05 DIAGNOSIS — R1310 Dysphagia, unspecified: Secondary | ICD-10-CM | POA: Diagnosis not present

## 2014-09-12 DIAGNOSIS — R1310 Dysphagia, unspecified: Secondary | ICD-10-CM | POA: Diagnosis not present

## 2014-09-20 DIAGNOSIS — E785 Hyperlipidemia, unspecified: Secondary | ICD-10-CM | POA: Diagnosis not present

## 2014-09-20 DIAGNOSIS — I2510 Atherosclerotic heart disease of native coronary artery without angina pectoris: Secondary | ICD-10-CM | POA: Diagnosis not present

## 2014-09-20 DIAGNOSIS — E039 Hypothyroidism, unspecified: Secondary | ICD-10-CM | POA: Diagnosis not present

## 2014-09-20 DIAGNOSIS — N390 Urinary tract infection, site not specified: Secondary | ICD-10-CM | POA: Diagnosis not present

## 2014-09-20 DIAGNOSIS — I10 Essential (primary) hypertension: Secondary | ICD-10-CM | POA: Diagnosis not present

## 2014-09-20 DIAGNOSIS — K210 Gastro-esophageal reflux disease with esophagitis: Secondary | ICD-10-CM | POA: Diagnosis not present

## 2014-10-20 DIAGNOSIS — M1711 Unilateral primary osteoarthritis, right knee: Secondary | ICD-10-CM | POA: Diagnosis not present

## 2014-10-24 DIAGNOSIS — M1711 Unilateral primary osteoarthritis, right knee: Secondary | ICD-10-CM | POA: Diagnosis not present

## 2014-11-03 DIAGNOSIS — M179 Osteoarthritis of knee, unspecified: Secondary | ICD-10-CM | POA: Diagnosis not present

## 2014-11-03 DIAGNOSIS — M160 Bilateral primary osteoarthritis of hip: Secondary | ICD-10-CM | POA: Diagnosis not present

## 2014-11-03 DIAGNOSIS — M25561 Pain in right knee: Secondary | ICD-10-CM | POA: Diagnosis not present

## 2014-11-03 DIAGNOSIS — G8929 Other chronic pain: Secondary | ICD-10-CM | POA: Diagnosis not present

## 2014-11-09 DIAGNOSIS — Z1389 Encounter for screening for other disorder: Secondary | ICD-10-CM | POA: Diagnosis not present

## 2014-11-09 DIAGNOSIS — M25561 Pain in right knee: Secondary | ICD-10-CM | POA: Diagnosis not present

## 2014-11-09 DIAGNOSIS — E039 Hypothyroidism, unspecified: Secondary | ICD-10-CM | POA: Diagnosis not present

## 2014-11-09 DIAGNOSIS — Z01818 Encounter for other preprocedural examination: Secondary | ICD-10-CM | POA: Diagnosis not present

## 2014-11-09 DIAGNOSIS — I2510 Atherosclerotic heart disease of native coronary artery without angina pectoris: Secondary | ICD-10-CM | POA: Diagnosis not present

## 2014-11-14 DIAGNOSIS — Z01810 Encounter for preprocedural cardiovascular examination: Secondary | ICD-10-CM | POA: Diagnosis not present

## 2014-11-14 DIAGNOSIS — I10 Essential (primary) hypertension: Secondary | ICD-10-CM

## 2014-11-14 DIAGNOSIS — Z6841 Body Mass Index (BMI) 40.0 and over, adult: Secondary | ICD-10-CM | POA: Diagnosis not present

## 2014-11-14 HISTORY — DX: Morbid (severe) obesity due to excess calories: E66.01

## 2014-11-14 HISTORY — DX: Body Mass Index (BMI) 40.0 and over, adult: Z684

## 2014-11-14 HISTORY — DX: Essential (primary) hypertension: I10

## 2014-11-17 DIAGNOSIS — I10 Essential (primary) hypertension: Secondary | ICD-10-CM | POA: Diagnosis not present

## 2014-11-17 DIAGNOSIS — Z6841 Body Mass Index (BMI) 40.0 and over, adult: Secondary | ICD-10-CM | POA: Diagnosis not present

## 2014-11-17 DIAGNOSIS — Z01810 Encounter for preprocedural cardiovascular examination: Secondary | ICD-10-CM | POA: Diagnosis not present

## 2014-12-06 DIAGNOSIS — M1711 Unilateral primary osteoarthritis, right knee: Secondary | ICD-10-CM | POA: Diagnosis not present

## 2014-12-06 HISTORY — DX: Unilateral primary osteoarthritis, right knee: M17.11

## 2014-12-07 ENCOUNTER — Other Ambulatory Visit: Payer: Self-pay | Admitting: Orthopedic Surgery

## 2014-12-16 ENCOUNTER — Ambulatory Visit (HOSPITAL_COMMUNITY): Admission: RE | Admit: 2014-12-16 | Payer: Medicare Other | Source: Ambulatory Visit

## 2014-12-16 ENCOUNTER — Encounter (HOSPITAL_COMMUNITY)
Admission: RE | Admit: 2014-12-16 | Discharge: 2014-12-16 | Disposition: A | Discharge status: Home / Self Care | Payer: Medicare Other | Source: Ambulatory Visit | Attending: Orthopedic Surgery | Admitting: Orthopedic Surgery

## 2014-12-16 ENCOUNTER — Encounter (HOSPITAL_COMMUNITY): Payer: Self-pay

## 2014-12-16 ENCOUNTER — Ambulatory Visit (HOSPITAL_COMMUNITY)
Admission: RE | Admit: 2014-12-16 | Discharge: 2014-12-16 | Disposition: A | Discharge status: Home / Self Care | Payer: Medicare Other | Source: Ambulatory Visit | Attending: Orthopedic Surgery | Admitting: Orthopedic Surgery

## 2014-12-16 DIAGNOSIS — J45909 Unspecified asthma, uncomplicated: Secondary | ICD-10-CM | POA: Insufficient documentation

## 2014-12-16 DIAGNOSIS — Z01812 Encounter for preprocedural laboratory examination: Secondary | ICD-10-CM | POA: Insufficient documentation

## 2014-12-16 DIAGNOSIS — Z01818 Encounter for other preprocedural examination: Secondary | ICD-10-CM | POA: Diagnosis not present

## 2014-12-16 HISTORY — DX: Other complications of anesthesia, initial encounter: T88.59XA

## 2014-12-16 HISTORY — DX: Dysphagia, unspecified: R13.10

## 2014-12-16 HISTORY — DX: Adverse effect of unspecified anesthetic, initial encounter: T41.45XA

## 2014-12-16 LAB — CBC WITH DIFFERENTIAL/PLATELET
Basophils Absolute: 0.1 10*3/uL (ref 0.0–0.1)
Basophils Relative: 1 %
Eosinophils Absolute: 0.4 10*3/uL (ref 0.0–0.7)
Eosinophils Relative: 5 %
HCT: 46.5 % (ref 39.0–52.0)
Hemoglobin: 15.9 g/dL (ref 13.0–17.0)
Lymphocytes Relative: 29 %
Lymphs Abs: 2.5 10*3/uL (ref 0.7–4.0)
MCH: 32.1 pg (ref 26.0–34.0)
MCHC: 34.2 g/dL (ref 30.0–36.0)
MCV: 93.8 fL (ref 78.0–100.0)
Monocytes Absolute: 0.7 10*3/uL (ref 0.1–1.0)
Monocytes Relative: 8 %
Neutro Abs: 4.9 10*3/uL (ref 1.7–7.7)
Neutrophils Relative %: 57 %
Platelets: 214 10*3/uL (ref 150–400)
RBC: 4.96 MIL/uL (ref 4.22–5.81)
RDW: 13.3 % (ref 11.5–15.5)
WBC: 8.5 10*3/uL (ref 4.0–10.5)

## 2014-12-16 LAB — SURGICAL PCR SCREEN
MRSA, PCR: NEGATIVE
Staphylococcus aureus: NEGATIVE

## 2014-12-16 LAB — COMPREHENSIVE METABOLIC PANEL
ALT: 15 U/L — ABNORMAL LOW (ref 17–63)
AST: 19 U/L (ref 15–41)
Albumin: 3.6 g/dL (ref 3.5–5.0)
Alkaline Phosphatase: 58 U/L (ref 38–126)
Anion gap: 7 (ref 5–15)
BUN: 8 mg/dL (ref 6–20)
CO2: 30 mmol/L (ref 22–32)
Calcium: 9 mg/dL (ref 8.9–10.3)
Chloride: 101 mmol/L (ref 101–111)
Creatinine, Ser: 0.98 mg/dL (ref 0.61–1.24)
GFR calc Af Amer: >60 mL/min (ref >60)
GFR calc non Af Amer: >60 mL/min (ref >60)
Glucose, Bld: 95 mg/dL (ref 65–99)
Potassium: 4.2 mmol/L (ref 3.5–5.1)
Sodium: 138 mmol/L (ref 135–145)
Total Bilirubin: 0.7 mg/dL (ref 0.3–1.2)
Total Protein: 6.4 g/dL — ABNORMAL LOW (ref 6.5–8.1)

## 2014-12-16 LAB — URINE MICROSCOPIC-ADD ON: RBC / HPF: NONE SEEN RBC/hpf (ref 0–5)

## 2014-12-16 LAB — URINALYSIS, ROUTINE W REFLEX MICROSCOPIC
Bilirubin Urine: NEGATIVE
Glucose, UA: NEGATIVE mg/dL
Hgb urine dipstick: NEGATIVE
Ketones, ur: NEGATIVE mg/dL
Nitrite: NEGATIVE
Protein, ur: NEGATIVE mg/dL
Specific Gravity, Urine: 1.011 (ref 1.005–1.030)
pH: 7.5 (ref 5.0–8.0)

## 2014-12-16 LAB — PROTIME-INR
INR: 1.1 (ref 0.00–1.49)
Prothrombin Time: 14.4 seconds (ref 11.6–15.2)

## 2014-12-16 LAB — APTT: aPTT: 30 seconds (ref 24–37)

## 2014-12-16 NOTE — Progress Notes (Signed)
Fax request sent to Dr. Geraldo Pitter for cardiac clearance, EKG & stress test result.

## 2014-12-16 NOTE — Pre-Procedure Instructions (Signed)
Jeydan Dorminy Canlas  12/16/2014      [facility] - [place], [place] - [place] [address] Phone: [phone] Fax: [phone]    Your procedure is scheduled on 12/26/2014.  Report to [facility] Admitting at 8:00 A.M.  Call this number if you have problems the morning of surgery:  [phone]   Remember:  Do not eat food or drink liquids after midnight.  On Sunday   Take these medicines the morning of surgery with A SIP OF WATER :  Thyroid medicine & Metoprolol.............pain medicine &/or  NEXIUM if needed   Do not wear jewelry   Do not wear lotions, powders, or perfumes.  You may wear deodorant.     Men may shave face and neck.   Do not bring valuables to the hospital   [facility] is not responsible for any belongings or valuables.  Contacts, dentures or bridgework may not be worn into surgery.  Leave your suitcase in the car.  After surgery it may be brought to your room.  For patients admitted to the hospital, discharge time will be determined by your treatment team.  Patients discharged the day of surgery will not be allowed to drive home.   Name and phone number of your driver:   /w niece  Special instructions:  Special Instructions: [facility] - Preparing for Surgery  Before surgery, you can play an important role.  Because skin is not sterile, your skin needs to be as free of germs as possible.  You can reduce the number of germs on you skin by washing with CHG (chlorahexidine gluconate) soap before surgery.  CHG is an antiseptic cleaner which kills germs and bonds with the skin to continue killing germs even after washing.  Please DO NOT use if you have an allergy to CHG or antibacterial soaps.  If your skin becomes reddened/irritated stop using the CHG and inform your nurse when you arrive at Short Stay.  Do not shave (including legs and underarms) for at least 48 hours prior to the first CHG shower.   You may shave your face.  Please follow these instructions carefully:   1.  Shower with CHG Soap the night before surgery and the  morning of Surgery.  2.  If you choose to wash your hair, wash your hair first as usual with your  normal shampoo.  3.  After you shampoo, rinse your hair and body thoroughly to remove the  Shampoo.  4.  Use CHG as you would any other liquid soap.  You can apply chg directly to the skin and wash gently with scrungie or a clean washcloth.  5.  Apply the CHG Soap to your body ONLY FROM THE NECK DOWN.    Do not use on open wounds or open sores.  Avoid contact with your eyes, ears, mouth and genitals (private parts).  Wash genitals (private parts)   with your normal soap.  6.  Wash thoroughly, paying special attention to the area where your surgery will be performed.  7.  Thoroughly rinse your body with warm water from the neck down.  8.  DO NOT shower/wash with your normal soap after using and rinsing off   the CHG Soap.  9.  Pat yourself dry with a clean towel.            10 .  Wear clean pajamas.  11.  Place clean sheets on your bed the night of your first shower and do not sleep with pets.  Day of Surgery  Do not apply any lotions/deodorants the morning of surgery.  Please wear clean clothes to the hospital/surgery center.  Please read over the following fact sheets that you were given. Pain Booklet, Coughing and Deep Breathing, Total Joint Packet, MRSA Information and Surgical Site Infection Prevention

## 2014-12-16 NOTE — Progress Notes (Signed)
   12/16/14 1202  OBSTRUCTIVE SLEEP APNEA  Have you ever been diagnosed with sleep apnea through a sleep study? No  Do you snore loudly (loud enough to be heard through closed doors)?  0  Do you often feel tired, fatigued, or sleepy during the daytime (such as falling asleep during driving or talking to someone)? 1  Has anyone observed you stop breathing during your sleep? 0  Do you have, or are you being treated for high blood pressure? 1  BMI more than 35 kg/m2? 1  Age > 50 (1-yes) 1  Neck circumference greater than:Male 16 inches or larger, Male 17inches or larger? 1  Male Gender (Yes=1) 1  Obstructive Sleep Apnea Score 6  Score 5 or greater  Results sent to PCP

## 2014-12-17 LAB — URINE CULTURE: Culture: 5000

## 2014-12-19 NOTE — Progress Notes (Signed)
Anesthesia Chart Review:  Pt is 69 year old male is scheduled for R total knee arthroplasty on 12/26/2014 with Dr. Mahlon Derby.   PCP is Dr. Truman Hayward with [facility]. Cardiologist is Dr. Lennox Pippins with [facility] in [place], last office visit 11/14/14 (care everywhere).   PMH includes:  HTN, MI > 10 years ago, exertional dyspnea, asthma, glaucoma, GERD, hypothyroidism. Former smoker. BMI 41. S/p C3-6 ACDF 04/19/14.   Medications include: albuterol, ASA, levothyroxine, lisinopril-hctz, Toprol, Halcion.  Preoperative labs reviewed.    Chest x-ray 12/16/14 reviewed. No acute cardiopulmonary disease   EKG 11/14/14: sinus rhythm  Nuclear stress test 11/17/14:  - No evidence of ischemia. EF 67%  If no changes, I anticipate pt can proceed with surgery as scheduled.   Willeen Cass, FNP-BC [facility]/Anesthesiology Phone: [phone] 12/19/2014 2:04 PM

## 2014-12-20 DIAGNOSIS — E785 Hyperlipidemia, unspecified: Secondary | ICD-10-CM | POA: Diagnosis not present

## 2014-12-20 DIAGNOSIS — I2510 Atherosclerotic heart disease of native coronary artery without angina pectoris: Secondary | ICD-10-CM | POA: Diagnosis not present

## 2014-12-20 DIAGNOSIS — M159 Polyosteoarthritis, unspecified: Secondary | ICD-10-CM | POA: Diagnosis not present

## 2014-12-20 DIAGNOSIS — I10 Essential (primary) hypertension: Secondary | ICD-10-CM | POA: Diagnosis not present

## 2014-12-20 DIAGNOSIS — E039 Hypothyroidism, unspecified: Secondary | ICD-10-CM | POA: Diagnosis not present

## 2014-12-20 DIAGNOSIS — K210 Gastro-esophageal reflux disease with esophagitis: Secondary | ICD-10-CM | POA: Diagnosis not present

## 2014-12-25 MED ORDER — DEXTROSE 5 % IV SOLN
3.0000 g | INTRAVENOUS | Status: AC
  Administered 2014-12-26: 3 g via INTRAVENOUS
  Filled 2014-12-25: qty 3000

## 2014-12-25 NOTE — Anesthesia Preprocedure Evaluation (Addendum)
Anesthesia Evaluation  Patient identified by MRN, date of birth, ID band Patient awake    Reviewed: Allergy & Precautions, Patient's Chart, lab work & pertinent test results  History of Anesthesia Complications Negative for: history of anesthetic complications  Airway Mallampati: II  TM Distance: >3 FB Neck ROM: Full    Dental  (+) Edentulous Upper, Dental Advisory Given   Pulmonary shortness of breath and with exertion, asthma , pneumonia, former smoker,    Pulmonary exam normal        Cardiovascular hypertension, Pt. on home beta blockers and Pt. on medications + Past MI  negative cardio ROS Normal cardiovascular exam     Neuro/Psych  Headaches, PSYCHIATRIC DISORDERS Anxiety    GI/Hepatic Neg liver ROS, GERD  ,  Endo/Other  Hypothyroidism Morbid obesity  Renal/GU negative Renal ROS     Musculoskeletal  (+) Arthritis ,   Abdominal   Peds  Hematology negative hematology ROS (+)   Anesthesia Other Findings   Reproductive/Obstetrics                            Anesthesia Physical  Anesthesia Plan  ASA: III  Anesthesia Plan: MAC and Spinal   Post-op Pain Management:    Induction: Intravenous  Airway Management Planned: Simple Face Mask and Natural Airway  Additional Equipment:   Intra-op Plan:   Post-operative Plan:   Informed Consent: I have reviewed the patients History and Physical, chart, labs and discussed the procedure including the risks, benefits and alternatives for the proposed anesthesia with the patient or authorized representative who has indicated his/her understanding and acceptance.   Dental advisory given  Plan Discussed with: CRNA, Anesthesiologist and Surgeon  Anesthesia Plan Comments:        Anesthesia Quick Evaluation

## 2014-12-26 ENCOUNTER — Inpatient Hospital Stay (HOSPITAL_COMMUNITY): Payer: Medicare Other | Admitting: Anesthesiology

## 2014-12-26 ENCOUNTER — Inpatient Hospital Stay (HOSPITAL_COMMUNITY): Payer: Medicare Other | Admitting: Emergency Medicine

## 2014-12-26 ENCOUNTER — Encounter (HOSPITAL_COMMUNITY)
Admission: RE | Disposition: A | Discharge status: Skilled Nursing Facility | Payer: Self-pay | Source: Ambulatory Visit | Attending: Orthopedic Surgery

## 2014-12-26 ENCOUNTER — Encounter (HOSPITAL_COMMUNITY): Payer: Self-pay | Admitting: Anesthesiology

## 2014-12-26 ENCOUNTER — Inpatient Hospital Stay (HOSPITAL_COMMUNITY)
Admission: RE | Admit: 2014-12-26 | Discharge: 2014-12-28 | DRG: 470 | Disposition: A | Discharge status: Skilled Nursing Facility | Payer: Medicare Other | Source: Ambulatory Visit | Attending: Orthopedic Surgery | Admitting: Orthopedic Surgery

## 2014-12-26 DIAGNOSIS — I252 Old myocardial infarction: Secondary | ICD-10-CM | POA: Diagnosis not present

## 2014-12-26 DIAGNOSIS — G8929 Other chronic pain: Secondary | ICD-10-CM | POA: Diagnosis not present

## 2014-12-26 DIAGNOSIS — I10 Essential (primary) hypertension: Secondary | ICD-10-CM | POA: Diagnosis present

## 2014-12-26 DIAGNOSIS — J45909 Unspecified asthma, uncomplicated: Secondary | ICD-10-CM | POA: Diagnosis present

## 2014-12-26 DIAGNOSIS — Z96659 Presence of unspecified artificial knee joint: Secondary | ICD-10-CM

## 2014-12-26 DIAGNOSIS — Z471 Aftercare following joint replacement surgery: Secondary | ICD-10-CM | POA: Diagnosis not present

## 2014-12-26 DIAGNOSIS — M1711 Unilateral primary osteoarthritis, right knee: Secondary | ICD-10-CM | POA: Diagnosis not present

## 2014-12-26 DIAGNOSIS — E039 Hypothyroidism, unspecified: Secondary | ICD-10-CM | POA: Diagnosis present

## 2014-12-26 DIAGNOSIS — Z981 Arthrodesis status: Secondary | ICD-10-CM

## 2014-12-26 DIAGNOSIS — G4700 Insomnia, unspecified: Secondary | ICD-10-CM | POA: Diagnosis not present

## 2014-12-26 DIAGNOSIS — Z87891 Personal history of nicotine dependence: Secondary | ICD-10-CM

## 2014-12-26 DIAGNOSIS — F419 Anxiety disorder, unspecified: Secondary | ICD-10-CM | POA: Diagnosis present

## 2014-12-26 DIAGNOSIS — K219 Gastro-esophageal reflux disease without esophagitis: Secondary | ICD-10-CM | POA: Diagnosis not present

## 2014-12-26 DIAGNOSIS — Z96651 Presence of right artificial knee joint: Secondary | ICD-10-CM | POA: Diagnosis not present

## 2014-12-26 DIAGNOSIS — M4712 Other spondylosis with myelopathy, cervical region: Secondary | ICD-10-CM | POA: Diagnosis not present

## 2014-12-26 DIAGNOSIS — H409 Unspecified glaucoma: Secondary | ICD-10-CM | POA: Diagnosis not present

## 2014-12-26 DIAGNOSIS — D62 Acute posthemorrhagic anemia: Secondary | ICD-10-CM | POA: Diagnosis not present

## 2014-12-26 DIAGNOSIS — M109 Gout, unspecified: Secondary | ICD-10-CM | POA: Diagnosis not present

## 2014-12-26 DIAGNOSIS — M549 Dorsalgia, unspecified: Secondary | ICD-10-CM | POA: Diagnosis not present

## 2014-12-26 DIAGNOSIS — M179 Osteoarthritis of knee, unspecified: Secondary | ICD-10-CM | POA: Diagnosis not present

## 2014-12-26 HISTORY — PX: TOTAL KNEE ARTHROPLASTY: SHX125

## 2014-12-26 HISTORY — DX: Presence of unspecified artificial knee joint: Z96.659

## 2014-12-26 LAB — CBC
HCT: 44.1 % (ref 39.0–52.0)
Hemoglobin: 14.6 g/dL (ref 13.0–17.0)
MCH: 31.7 pg (ref 26.0–34.0)
MCHC: 33.1 g/dL (ref 30.0–36.0)
MCV: 95.7 fL (ref 78.0–100.0)
Platelets: 198 10*3/uL (ref 150–400)
RBC: 4.61 MIL/uL (ref 4.22–5.81)
RDW: 13.5 % (ref 11.5–15.5)
WBC: 9.2 10*3/uL (ref 4.0–10.5)

## 2014-12-26 LAB — CREATININE, SERUM
Creatinine, Ser: 1.03 mg/dL (ref 0.61–1.24)
GFR calc Af Amer: >60 mL/min (ref >60)
GFR calc non Af Amer: >60 mL/min (ref >60)

## 2014-12-26 SURGERY — ARTHROPLASTY, KNEE, TOTAL
Anesthesia: General | Site: Knee | Laterality: Right

## 2014-12-26 MED ORDER — MIDAZOLAM HCL 2 MG/2ML IJ SOLN
INTRAMUSCULAR | Status: AC
  Filled 2014-12-26: qty 2

## 2014-12-26 MED ORDER — METOCLOPRAMIDE HCL 5 MG/ML IJ SOLN: 5.0000 mg | Freq: Three times a day (TID) | INTRAMUSCULAR | Status: DC | PRN

## 2014-12-26 MED ORDER — METHOCARBAMOL 1000 MG/10ML IJ SOLN
500.0000 mg | Freq: Four times a day (QID) | INTRAMUSCULAR | Status: DC | PRN
  Filled 2014-12-26: qty 5

## 2014-12-26 MED ORDER — CHLORHEXIDINE GLUCONATE 4 % EX LIQD: 60.0000 mL | Freq: Once | CUTANEOUS | Status: DC

## 2014-12-26 MED ORDER — LISINOPRIL 20 MG PO TABS
20.0000 mg | ORAL_TABLET | Freq: Every day | ORAL | Status: DC
  Administered 2014-12-26 – 2014-12-27 (×2): 20 mg via ORAL
  Filled 2014-12-26 (×2): qty 1

## 2014-12-26 MED ORDER — ONDANSETRON HCL 4 MG/2ML IJ SOLN: 4.0000 mg | Freq: Four times a day (QID) | INTRAMUSCULAR | Status: DC | PRN

## 2014-12-26 MED ORDER — PROPOFOL 10 MG/ML IV BOLUS
INTRAVENOUS | Status: DC | PRN
Start: 2014-12-26 — End: 2014-12-26
  Administered 2014-12-26: 100 mg via INTRAVENOUS
  Administered 2014-12-26 (×2): 50 mg via INTRAVENOUS

## 2014-12-26 MED ORDER — LIDOCAINE HCL (CARDIAC) 20 MG/ML IV SOLN
INTRAVENOUS | Status: DC | PRN
  Administered 2014-12-26: 100 mg via INTRAVENOUS

## 2014-12-26 MED ORDER — BUPIVACAINE-EPINEPHRINE (PF) 0.25% -1:200000 IJ SOLN
INTRAMUSCULAR | Status: DC | PRN
  Administered 2014-12-26: 30 mL

## 2014-12-26 MED ORDER — HYDROMORPHONE HCL 1 MG/ML IJ SOLN
1.0000 mg | INTRAMUSCULAR | Status: DC | PRN
Start: 2014-12-26 — End: 2014-12-28
  Administered 2014-12-26 – 2014-12-27 (×2): 1 mg via INTRAVENOUS
  Filled 2014-12-26 (×3): qty 1

## 2014-12-26 MED ORDER — HYDROCHLOROTHIAZIDE 12.5 MG PO CAPS
12.5000 mg | ORAL_CAPSULE | Freq: Every day | ORAL | Status: DC
  Administered 2014-12-26 – 2014-12-27 (×2): 12.5 mg via ORAL
  Filled 2014-12-26 (×2): qty 1

## 2014-12-26 MED ORDER — DIPHENHYDRAMINE HCL 12.5 MG/5ML PO ELIX: 12.5000 mg | ORAL_SOLUTION | ORAL | Status: DC | PRN

## 2014-12-26 MED ORDER — LIDOCAINE HCL (CARDIAC) 20 MG/ML IV SOLN
INTRAVENOUS | Status: AC
  Filled 2014-12-26: qty 5

## 2014-12-26 MED ORDER — PANTOPRAZOLE SODIUM 40 MG PO TBEC
40.0000 mg | DELAYED_RELEASE_TABLET | Freq: Every day | ORAL | Status: DC
  Administered 2014-12-26 – 2014-12-28 (×3): 40 mg via ORAL
  Filled 2014-12-26 (×3): qty 1

## 2014-12-26 MED ORDER — ALBUTEROL SULFATE HFA 108 (90 BASE) MCG/ACT IN AERS: 2.0000 | INHALATION_SPRAY | Freq: Four times a day (QID) | RESPIRATORY_TRACT | Status: DC | PRN

## 2014-12-26 MED ORDER — OXYCODONE HCL 5 MG PO TABS
5.0000 mg | ORAL_TABLET | ORAL | Status: DC | PRN
  Administered 2014-12-26 – 2014-12-28 (×9): 10 mg via ORAL
  Filled 2014-12-26 (×10): qty 2

## 2014-12-26 MED ORDER — BUPIVACAINE-EPINEPHRINE (PF) 0.25% -1:200000 IJ SOLN
INTRAMUSCULAR | Status: AC
  Filled 2014-12-26: qty 30

## 2014-12-26 MED ORDER — ALUM & MAG HYDROXIDE-SIMETH 200-200-20 MG/5ML PO SUSP: 30.0000 mL | ORAL | Status: DC | PRN

## 2014-12-26 MED ORDER — SODIUM CHLORIDE 0.9 % IV SOLN
INTRAVENOUS | Status: DC
  Administered 2014-12-26 – 2014-12-27 (×2): via INTRAVENOUS

## 2014-12-26 MED ORDER — HYDROMORPHONE HCL 1 MG/ML IJ SOLN: 0.2500 mg | INTRAMUSCULAR | Status: DC | PRN

## 2014-12-26 MED ORDER — ONDANSETRON HCL 4 MG PO TABS: 4.0000 mg | ORAL_TABLET | Freq: Four times a day (QID) | ORAL | Status: DC | PRN

## 2014-12-26 MED ORDER — FENTANYL CITRATE (PF) 100 MCG/2ML IJ SOLN
INTRAMUSCULAR | Status: DC | PRN
  Administered 2014-12-26 (×2): 50 ug via INTRAVENOUS

## 2014-12-26 MED ORDER — FENTANYL CITRATE (PF) 250 MCG/5ML IJ SOLN
INTRAMUSCULAR | Status: AC
  Filled 2014-12-26: qty 5

## 2014-12-26 MED ORDER — SODIUM CHLORIDE 0.9 % IV SOLN: INTRAVENOUS | Status: DC

## 2014-12-26 MED ORDER — OXYCODONE HCL ER 10 MG PO T12A
10.0000 mg | EXTENDED_RELEASE_TABLET | Freq: Two times a day (BID) | ORAL | Status: DC
  Administered 2014-12-26 – 2014-12-27 (×3): 10 mg via ORAL
  Filled 2014-12-26 (×3): qty 1

## 2014-12-26 MED ORDER — PHENOL 1.4 % MT LIQD
1.0000 | OROMUCOSAL | Status: DC | PRN
Start: 2014-12-26 — End: 2014-12-28

## 2014-12-26 MED ORDER — LEVOTHYROXINE SODIUM 200 MCG PO TABS
200.0000 ug | ORAL_TABLET | Freq: Every day | ORAL | Status: DC
  Administered 2014-12-27 – 2014-12-28 (×2): 200 ug via ORAL
  Filled 2014-12-26 (×2): qty 1

## 2014-12-26 MED ORDER — BUPIVACAINE-EPINEPHRINE (PF) 0.5% -1:200000 IJ SOLN
INTRAMUSCULAR | Status: AC
  Filled 2014-12-26: qty 30

## 2014-12-26 MED ORDER — ACETAMINOPHEN 325 MG PO TABS: 650.0000 mg | ORAL_TABLET | Freq: Four times a day (QID) | ORAL | Status: DC | PRN

## 2014-12-26 MED ORDER — ZOLPIDEM TARTRATE 5 MG PO TABS: 5.0000 mg | ORAL_TABLET | Freq: Every evening | ORAL | Status: DC | PRN

## 2014-12-26 MED ORDER — SODIUM CHLORIDE 0.9 % IJ SOLN
INTRAMUSCULAR | Status: DC | PRN
  Administered 2014-12-26: 20 mL

## 2014-12-26 MED ORDER — SODIUM CHLORIDE 0.9 % IR SOLN
Status: DC | PRN
  Administered 2014-12-26: 1000 mL

## 2014-12-26 MED ORDER — MENTHOL 3 MG MT LOZG
1.0000 | LOZENGE | OROMUCOSAL | Status: DC | PRN
  Filled 2014-12-26: qty 9

## 2014-12-26 MED ORDER — PROPOFOL 500 MG/50ML IV EMUL
INTRAVENOUS | Status: DC | PRN
  Administered 2014-12-26: 100 ug/kg/min via INTRAVENOUS

## 2014-12-26 MED ORDER — CEFAZOLIN SODIUM-DEXTROSE 2-3 GM-% IV SOLR
2.0000 g | Freq: Four times a day (QID) | INTRAVENOUS | Status: AC
  Administered 2014-12-26: 2 g via INTRAVENOUS
  Filled 2014-12-26 (×2): qty 50

## 2014-12-26 MED ORDER — ACETAMINOPHEN 650 MG RE SUPP: 650.0000 mg | Freq: Four times a day (QID) | RECTAL | Status: DC | PRN

## 2014-12-26 MED ORDER — SENNOSIDES-DOCUSATE SODIUM 8.6-50 MG PO TABS: 1.0000 | ORAL_TABLET | Freq: Every evening | ORAL | Status: DC | PRN

## 2014-12-26 MED ORDER — METOCLOPRAMIDE HCL 5 MG PO TABS: 5.0000 mg | ORAL_TABLET | Freq: Three times a day (TID) | ORAL | Status: DC | PRN

## 2014-12-26 MED ORDER — LACTATED RINGERS IV SOLN
INTRAVENOUS | Status: DC
  Administered 2014-12-26 (×3): via INTRAVENOUS

## 2014-12-26 MED ORDER — METOPROLOL SUCCINATE ER 50 MG PO TB24
50.0000 mg | ORAL_TABLET | Freq: Every day | ORAL | Status: DC
  Administered 2014-12-27: 50 mg via ORAL
  Filled 2014-12-26 (×2): qty 1

## 2014-12-26 MED ORDER — BUPIVACAINE LIPOSOME 1.3 % IJ SUSP
20.0000 mL | Freq: Once | INTRAMUSCULAR | Status: AC
  Administered 2014-12-26: 20 mL
  Filled 2014-12-26: qty 20

## 2014-12-26 MED ORDER — EPHEDRINE SULFATE 50 MG/ML IJ SOLN
INTRAMUSCULAR | Status: DC | PRN
  Administered 2014-12-26 (×2): 10 mg via INTRAVENOUS
  Administered 2014-12-26: 5 mg via INTRAVENOUS
  Administered 2014-12-26: 10 mg via INTRAVENOUS
  Administered 2014-12-26 (×3): 5 mg via INTRAVENOUS

## 2014-12-26 MED ORDER — DOCUSATE SODIUM 100 MG PO CAPS
100.0000 mg | ORAL_CAPSULE | Freq: Two times a day (BID) | ORAL | Status: DC
  Administered 2014-12-27 – 2014-12-28 (×3): 100 mg via ORAL
  Filled 2014-12-26 (×4): qty 1

## 2014-12-26 MED ORDER — DICYCLOMINE HCL 10 MG PO CAPS: 10.0000 mg | ORAL_CAPSULE | Freq: Four times a day (QID) | ORAL | Status: DC | PRN

## 2014-12-26 MED ORDER — LORATADINE 10 MG PO TABS: 10.0000 mg | ORAL_TABLET | Freq: Every day | ORAL | Status: DC | PRN

## 2014-12-26 MED ORDER — ALPRAZOLAM 0.5 MG PO TABS: 0.5000 mg | ORAL_TABLET | Freq: Three times a day (TID) | ORAL | Status: DC | PRN

## 2014-12-26 MED ORDER — TRIAZOLAM 0.125 MG PO TABS: 0.2500 mg | ORAL_TABLET | Freq: Every evening | ORAL | Status: DC | PRN

## 2014-12-26 MED ORDER — TRANEXAMIC ACID 1000 MG/10ML IV SOLN
1000.0000 mg | INTRAVENOUS | Status: AC
  Administered 2014-12-26: 1000 mg via INTRAVENOUS
  Filled 2014-12-26: qty 10

## 2014-12-26 MED ORDER — PROPOFOL 10 MG/ML IV BOLUS
INTRAVENOUS | Status: AC
  Filled 2014-12-26: qty 20

## 2014-12-26 MED ORDER — FLEET ENEMA 7-19 GM/118ML RE ENEM: 1.0000 | ENEMA | Freq: Once | RECTAL | Status: DC | PRN

## 2014-12-26 MED ORDER — TRANEXAMIC ACID 1000 MG/10ML IV SOLN: 1000.0000 mg | Freq: Once | INTRAVENOUS | Status: DC

## 2014-12-26 MED ORDER — PROMETHAZINE HCL 25 MG/ML IJ SOLN: 6.2500 mg | INTRAMUSCULAR | Status: DC | PRN

## 2014-12-26 MED ORDER — BISACODYL 5 MG PO TBEC: 5.0000 mg | DELAYED_RELEASE_TABLET | Freq: Every day | ORAL | Status: DC | PRN

## 2014-12-26 MED ORDER — LISINOPRIL-HYDROCHLOROTHIAZIDE 20-12.5 MG PO TABS: 1.0000 | ORAL_TABLET | Freq: Every day | ORAL | Status: DC

## 2014-12-26 MED ORDER — MIDAZOLAM HCL 5 MG/5ML IJ SOLN
INTRAMUSCULAR | Status: DC | PRN
  Administered 2014-12-26 (×2): 1 mg via INTRAVENOUS

## 2014-12-26 MED ORDER — METHOCARBAMOL 500 MG PO TABS
500.0000 mg | ORAL_TABLET | Freq: Four times a day (QID) | ORAL | Status: DC | PRN
  Administered 2014-12-26 – 2014-12-28 (×6): 500 mg via ORAL
  Filled 2014-12-26 (×6): qty 1

## 2014-12-26 MED ORDER — BUPIVACAINE IN DEXTROSE 0.75-8.25 % IT SOLN
INTRATHECAL | Status: DC | PRN
  Administered 2014-12-26: 15 mg via INTRATHECAL

## 2014-12-26 MED ORDER — ENOXAPARIN SODIUM 30 MG/0.3ML ~~LOC~~ SOLN
30.0000 mg | Freq: Two times a day (BID) | SUBCUTANEOUS | Status: DC
  Administered 2014-12-27 – 2014-12-28 (×3): 30 mg via SUBCUTANEOUS
  Filled 2014-12-26 (×3): qty 0.3

## 2014-12-26 MED ORDER — CELECOXIB 200 MG PO CAPS
200.0000 mg | ORAL_CAPSULE | Freq: Two times a day (BID) | ORAL | Status: DC
  Administered 2014-12-26 – 2014-12-28 (×4): 200 mg via ORAL
  Filled 2014-12-26 (×4): qty 1

## 2014-12-26 MED ORDER — ONDANSETRON HCL 4 MG/2ML IJ SOLN
INTRAMUSCULAR | Status: DC | PRN
  Administered 2014-12-26: 4 mg via INTRAVENOUS

## 2014-12-26 MED ORDER — ONDANSETRON HCL 4 MG/2ML IJ SOLN
INTRAMUSCULAR | Status: AC
Start: 2014-12-26 — End: 2014-12-26
  Filled 2014-12-26: qty 2

## 2014-12-26 MED ORDER — LATANOPROST 0.005 % OP SOLN
1.0000 [drp] | Freq: Every day | OPHTHALMIC | Status: DC
  Administered 2014-12-26 – 2014-12-27 (×2): 1 [drp] via OPHTHALMIC
  Filled 2014-12-26 (×2): qty 2.5

## 2014-12-26 SURGICAL SUPPLY — 58 items
BANDAGE ESMARK 6X9 LF (GAUZE/BANDAGES/DRESSINGS) ×1 IMPLANT
BLADE SAGITTAL 13X1.27X60 (BLADE) ×2 IMPLANT
BLADE SAW SGTL 83.5X18.5 (BLADE) ×2 IMPLANT
BLADE SURG 10 STRL SS (BLADE) ×2 IMPLANT
BNDG CMPR 9X6 STRL LF SNTH (GAUZE/BANDAGES/DRESSINGS) ×1
BNDG ESMARK 6X9 LF (GAUZE/BANDAGES/DRESSINGS) ×2
BOWL SMART MIX CTS (DISPOSABLE) ×2 IMPLANT
CAPT KNEE TOTAL 3 ×2 IMPLANT
CEMENT BONE SIMPLEX SPEEDSET (Cement) ×4 IMPLANT
COVER SURGICAL LIGHT HANDLE (MISCELLANEOUS) ×2 IMPLANT
CUFF TOURNIQUET SINGLE 34IN LL (TOURNIQUET CUFF) ×2 IMPLANT
DRAPE EXTREMITY T 121X128X90 (DRAPE) ×2 IMPLANT
DRAPE INCISE IOBAN 66X45 STRL (DRAPES) ×4 IMPLANT
DRAPE PROXIMA HALF (DRAPES) IMPLANT
DRAPE U-SHAPE 47X51 STRL (DRAPES) ×2 IMPLANT
DRSG ADAPTIC 3X8 NADH LF (GAUZE/BANDAGES/DRESSINGS) ×2 IMPLANT
DRSG PAD ABDOMINAL 8X10 ST (GAUZE/BANDAGES/DRESSINGS) ×2 IMPLANT
DURAPREP 26ML APPLICATOR (WOUND CARE) ×4 IMPLANT
ELECT REM PT RETURN 9FT ADLT (ELECTROSURGICAL) ×2
ELECTRODE REM PT RTRN 9FT ADLT (ELECTROSURGICAL) ×1 IMPLANT
GAUZE SPONGE 4X4 12PLY STRL (GAUZE/BANDAGES/DRESSINGS) ×2 IMPLANT
GLOVE BIOGEL M 7.0 STRL (GLOVE) IMPLANT
GLOVE BIOGEL PI IND STRL 7.5 (GLOVE) IMPLANT
GLOVE BIOGEL PI IND STRL 8.5 (GLOVE) ×2 IMPLANT
GLOVE BIOGEL PI INDICATOR 7.5 (GLOVE) ×1
GLOVE BIOGEL PI INDICATOR 8.5 (GLOVE) ×2
GLOVE SURG ORTHO 8.0 STRL STRW (GLOVE) ×4 IMPLANT
GLOVE SURG SS PI 6.5 STRL IVOR (GLOVE) ×1 IMPLANT
GOWN STRL REUS W/ TWL LRG LVL3 (GOWN DISPOSABLE) ×1 IMPLANT
GOWN STRL REUS W/ TWL XL LVL3 (GOWN DISPOSABLE) ×2 IMPLANT
GOWN STRL REUS W/TWL LRG LVL3 (GOWN DISPOSABLE) ×2
GOWN STRL REUS W/TWL XL LVL3 (GOWN DISPOSABLE) ×4
HANDPIECE INTERPULSE COAX TIP (DISPOSABLE) ×2
HOOD PEEL AWAY FACE SHEILD DIS (HOOD) ×6 IMPLANT
KIT BASIN OR (CUSTOM PROCEDURE TRAY) ×2 IMPLANT
KIT ROOM TURNOVER OR (KITS) ×2 IMPLANT
KNEE CAPITATED TOTAL 3 IMPLANT
MANIFOLD NEPTUNE II (INSTRUMENTS) ×2 IMPLANT
NEEDLE 22X1 1/2 (OR ONLY) (NEEDLE) ×4 IMPLANT
NS IRRIG 1000ML POUR BTL (IV SOLUTION) ×2 IMPLANT
PACK TOTAL JOINT (CUSTOM PROCEDURE TRAY) ×2 IMPLANT
PACK UNIVERSAL I (CUSTOM PROCEDURE TRAY) ×2 IMPLANT
PAD ARMBOARD 7.5X6 YLW CONV (MISCELLANEOUS) ×4 IMPLANT
PADDING CAST COTTON 6X4 STRL (CAST SUPPLIES) ×2 IMPLANT
SET HNDPC FAN SPRY TIP SCT (DISPOSABLE) ×1 IMPLANT
SPONGE GAUZE 4X4 12PLY STER LF (GAUZE/BANDAGES/DRESSINGS) ×1 IMPLANT
STAPLER VISISTAT 35W (STAPLE) ×2 IMPLANT
SUCTION FRAZIER TIP 10 FR DISP (SUCTIONS) ×2 IMPLANT
SUT BONE WAX W31G (SUTURE) ×2 IMPLANT
SUT VIC AB 0 CTB1 27 (SUTURE) ×4 IMPLANT
SUT VIC AB 1 CT1 27 (SUTURE) ×4
SUT VIC AB 1 CT1 27XBRD ANBCTR (SUTURE) ×2 IMPLANT
SUT VIC AB 2-0 CT1 27 (SUTURE) ×4
SUT VIC AB 2-0 CT1 TAPERPNT 27 (SUTURE) ×2 IMPLANT
SYR 20CC LL (SYRINGE) ×4 IMPLANT
TOWEL OR 17X24 6PK STRL BLUE (TOWEL DISPOSABLE) ×2 IMPLANT
TOWEL OR 17X26 10 PK STRL BLUE (TOWEL DISPOSABLE) ×2 IMPLANT
WATER STERILE IRR 1000ML POUR (IV SOLUTION) ×4 IMPLANT

## 2014-12-26 NOTE — Progress Notes (Signed)
Utilization review completed.  

## 2014-12-26 NOTE — H&P (Signed)
Glenn Beck MRN:  [id number] DOB/SEX:  1945-07-15/male  CHIEF COMPLAINT:  Painful right Knee  HISTORY: Patient is a 69 y.o. male presented with a history of pain in the right knee. Onset of symptoms was gradual starting several years ago with gradually worsening course since that time. Prior procedures on the knee include none. Patient has been treated conservatively with over-the-counter NSAIDs and activity modification. Patient currently rates pain in the knee at 10 out of 10 with activity. There is pain at night.  PAST MEDICAL HISTORY: Patient Active Problem List   Diagnosis Date Noted  . Cervical spondylosis with myelopathy 04/19/2014   Past Medical History  Diagnosis Date  . Glaucoma     uses eye drops  . Hypertension     takes Lisinopril and Metoprolol daily  . Myocardial infarction ([place]) >59yrs ago  . Asthma     bronchial  . Shortness of breath dyspnea     with exertion  . Pneumonia 69yrs ago  . Headache     r/t neck issues  . Joint pain   . Chronic back pain   . Gout     on study medications for this  . GERD (gastroesophageal reflux disease)     takes Nexium daily as needed  . History of colon polyps     benign  . Diverticulosis     takes Bentyl daily as needed  . Kidney infection     beint treated at present with Amoxicillin   . Hypothyroidism     takes Synthroid daily  . Anxiety     takes Xanax daily as needed  . Insomnia     takes Triazolam nightly as needed  . History of shingles   . Swallowing difficulty     has been evaluated at [facility]. for this problem since Cervical Fusion  . Arthritis     "all over my body"  . Complication of anesthesia     pt. reports swallowing has been difficult since having ACDF- 2016, has been eval. by ENT & testing done at [facility].    Past Surgical History  Procedure Laterality Date  . Back surgery      x 2;fusion  . Tonsillectomy    . Colonoscopy    . Esophagogastroduodenoscopy    . Anterior  cervical decomp/discectomy fusion N/A 04/19/2014    Procedure: ANTERIOR CERVICAL DECOMPRESSION/DISCECTOMY FUSION CERVICAL THREE-FOUR CERVICAL FOUR-FIVE ,CERVICAL FIVE-SIX WITH INTERBODY PROTHESIS PLATING BONEGRAFT;  Surgeon: Consuella Lose, MD;  Location: [facility];  Service: Neurosurgery;  Laterality: N/A;  . Cardiac catheterization  >21yrs ago    /w angioplasty  . Eye surgery Bilateral   . Hernia repair Right     many yrs. ago   . Knee arthroscopy Right      MEDICATIONS:   No prescriptions prior to admission    ALLERGIES:   Allergies  Allergen Reactions  . Nasacort [Triamcinolone] Hives  . Other Rash    Reaction to unknown depression medication    REVIEW OF SYSTEMS:  Pertinent items noted in HPI and remainder of comprehensive ROS otherwise negative.   FAMILY HISTORY:  No family history on file.  SOCIAL HISTORY:   Social History  Substance Use Topics  . Smoking status: Former Research scientist (life sciences)  . Smokeless tobacco: Not on file     Comment: quit smoking in 2001  . Alcohol Use: No     EXAMINATION:  Vital signs in last 24 hours:    General appearance: alert, cooperative and no  distress Lungs: clear to auscultation bilaterally Heart: regular rate and rhythm, S1, S2 normal, no murmur, click, rub or gallop Abdomen: soft, non-tender; bowel sounds normal; no masses,  no organomegaly Extremities: extremities normal, atraumatic, no cyanosis or edema and Homans sign is negative, no sign of DVT Pulses: 2+ and symmetric Skin: Skin color, texture, turgor normal. No rashes or lesions Neurologic: Alert and oriented X 3, normal strength and tone. Normal symmetric reflexes. Normal coordination and gait  Musculoskeletal:  ROM 0-100, Ligaments intact,  Imaging Review Plain radiographs demonstrate severe degenerative joint disease of the right knee. The overall alignment is significant varus. The bone quality appears to be good for age and reported activity  level.  Assessment/Plan: Primary osteoarthritis, right knee   The patient history, physical examination and imaging studies are consistent with advanced degenerative joint disease of the right knee. The patient has failed conservative treatment.  The clearance notes were reviewed.  After discussion with the patient it was felt that Total Knee Replacement was indicated. The procedure,  risks, and benefits of total knee arthroplasty were presented and reviewed. The risks including but not limited to aseptic loosening, infection, blood clots, vascular injury, stiffness, patella tracking problems complications among others were discussed. The patient acknowledged the explanation, agreed to proceed with the plan.  Guy Seese 12/26/2014, 6:41 AM

## 2014-12-26 NOTE — Anesthesia Procedure Notes (Addendum)
Spinal Patient location during procedure: OR Start time: 12/26/2014 10:19 AM End time: 12/26/2014 10:28 AM Staffing Anesthesiologist: Duane Boston Performed by: anesthesiologist  Preanesthetic Checklist Completed: patient identified, surgical consent, pre-op evaluation, timeout performed, IV checked, risks and benefits discussed and monitors and equipment checked Spinal Block Patient position: sitting Prep: DuraPrep Patient monitoring: cardiac monitor, continuous pulse ox and blood pressure Approach: midline Location: L2-3 Injection technique: single-shot Needle Needle type: Pencan  Needle gauge: 24 G Needle length: 9 cm Additional Notes Functioning IV was confirmed and monitors were applied. Sterile prep and drape, including hand hygiene and sterile gloves were used. The patient was positioned and the spine was prepped. The skin was anesthetized with lidocaine.  Free flow of clear CSF was obtained prior to injecting local anesthetic into the CSF.  The spinal needle aspirated freely following injection.  The needle was carefully withdrawn.  The patient tolerated the procedure well.   Procedure Name: LMA Insertion Date/Time: 12/26/2014 10:50 AM Performed by: Scheryl Darter Pre-anesthesia Checklist: Patient identified, Emergency Drugs available, Suction available, Patient being monitored and Timeout performed Patient Re-evaluated:Patient Re-evaluated prior to inductionOxygen Delivery Method: Circle system utilized Preoxygenation: Pre-oxygenation with 100% oxygen Intubation Type: IV induction Ventilation: Mask ventilation without difficulty LMA: LMA inserted LMA Size: 5.0 Number of attempts: 1 Placement Confirmation: positive ETCO2 and breath sounds checked- equal and bilateral Tube secured with: Tape Dental Injury: Teeth and Oropharynx as per pre-operative assessment

## 2014-12-26 NOTE — Anesthesia Postprocedure Evaluation (Signed)
Anesthesia Post Note  Patient: Glenn Beck  Procedure(s) Performed: Procedure(s) (LRB): TOTAL KNEE ARTHROPLASTY (Right)  Patient location during evaluation: PACU Anesthesia Type: General Level of consciousness: sedated Pain management: pain level controlled Vital Signs Assessment: post-procedure vital signs reviewed and stable Respiratory status: spontaneous breathing and respiratory function stable Cardiovascular status: stable Anesthetic complications: no    Last Vitals:  Filed Vitals:   12/26/14 1245 12/26/14 1300  BP: 98/61 103/59  Pulse: 69 72  Temp:    Resp: 13 14    Last Pain:  Filed Vitals:   12/26/14 1305  PainSc: 0-No pain                 Jep Dyas DANIEL

## 2014-12-26 NOTE — Transfer of Care (Signed)
Immediate Anesthesia Transfer of Care Note  Patient: Glenn Beck  Procedure(s) Performed: Procedure(s): TOTAL KNEE ARTHROPLASTY (Right)  Patient Location: PACU  Anesthesia Type:General and Spinal  Level of Consciousness: awake, alert , oriented and sedated  Airway & Oxygen Therapy: Patient Spontanous Breathing and Patient connected to nasal cannula oxygen  Post-op Assessment: Report given to RN, Post -op Vital signs reviewed and stable and Patient moving all extremities  Post vital signs: Reviewed  Last Vitals:  Filed Vitals:   12/26/14 0759  BP: 119/60  Pulse: 70  Temp: 36.3 C  Resp: 20    Complications: No apparent anesthesia complications

## 2014-12-26 NOTE — Evaluation (Signed)
Physical Therapy Evaluation Patient Details Name: Glenn Beck MRN: [id number] DOB: 01-27-1945 Today's Date: 12/26/2014   History of Present Illness  Patient is a 69 y/o male s/p Rt TKA. PMH includes ACDF C3-4, 5-6, HTN, MI, glaucoma, gout, anxiety.  Clinical Impression  Patient presents with pain and post surgical deficits RLE s/p Rt TKA. Tolerated short distance ambulation with Min guard assist for safety. Instructed pt in exercises. Reviewed precautions. Pt lives alone and has no support at d/c. Would benefit from [facility] SNF to maximize independence and mobility prior to return home.    Follow Up Recommendations SNF    Equipment Recommendations  None recommended by PT    Recommendations for Other Services OT consult     Precautions / Restrictions Precautions Precautions: Knee Precaution Booklet Issued: No Precaution Comments: Reviewed no pillow under knee and precautions. Restrictions Weight Bearing Restrictions: Yes RLE Weight Bearing: Weight bearing as tolerated      Mobility  Bed Mobility Overal bed mobility: Needs Assistance Bed Mobility: Supine to Sit     Supine to sit: Mod assist;HOB elevated     General bed mobility comments: Assist to scoot bottom to EOB and elevate trunk.   Transfers Overall transfer level: Needs assistance Equipment used: Rolling walker (2 wheeled) Transfers: Sit to/from Stand Sit to Stand: Min assist         General transfer comment: Min A to boost from EOB with cues for hand placement/technique. transferred to chair post ambulation bout.  Ambulation/Gait Ambulation/Gait assistance: Min guard Ambulation Distance (Feet): 6 Feet Assistive device: Rolling walker (2 wheeled) Gait Pattern/deviations: Step-to pattern;Decreased stance time - right;Decreased step length - left;Decreased stride length;Trunk flexed Gait velocity: decreased Gait velocity interpretation: <1.8 ft/sec, indicative of risk for recurrent falls General Gait  Details: Able to take a few steps to the chair with Min guard assist for safety. Cues for RW management.  Stairs            Wheelchair Mobility    Modified Rankin (Stroke Patients Only)       Balance Overall balance assessment: Needs assistance Sitting-balance support: Feet supported;No upper extremity supported Sitting balance-Leahy Scale: Good     Standing balance support: During functional activity Standing balance-Leahy Scale: Poor Standing balance comment: Relient on RW for support.                              Pertinent Vitals/Pain Pain Assessment: 0-10 Pain Score: 8  Pain Location: right knee Pain Descriptors / Indicators: Sore Pain Intervention(s): Monitored during session;Repositioned;Premedicated before session    Home Living Family/patient expects to be discharged to:: Skilled nursing facility ([facility] in [place]) Living Arrangements: Alone                    Prior Function Level of Independence: Independent               Hand Dominance   Dominant Hand: Right    Extremity/Trunk Assessment   Upper Extremity Assessment: Defer to OT evaluation           Lower Extremity Assessment: RLE deficits/detail RLE Deficits / Details: Limited AROM/strength secondary to pain and post op.       Communication   Communication: No difficulties  Cognition Arousal/Alertness: Awake/alert Behavior During Therapy: WFL for tasks assessed/performed Overall Cognitive Status: Within Functional Limits for tasks assessed  General Comments General comments (skin integrity, edema, etc.): Niece present inr oom during session.    Exercises Total Joint Exercises Ankle Circles/Pumps: Both;10 reps;Supine Quad Sets: Both;10 reps;Supine (3-5 sec hold) Gluteal Sets: Both;10 reps;Supine      Assessment/Plan    PT Assessment Patient needs continued PT services  PT Diagnosis Difficulty walking;Acute pain   PT  Problem List Decreased strength;Pain;Decreased range of motion;Impaired sensation;Decreased activity tolerance;Decreased balance;Decreased mobility  PT Treatment Interventions Balance training;Gait training;Functional mobility training;Therapeutic activities;Therapeutic exercise;Patient/family education;Stair training   PT Goals (Current goals can be found in the Care Plan section) Acute Rehab PT Goals Patient Stated Goal: to go to rehab before returning home PT Goal Formulation: With patient Time For Goal Achievement: 01/09/15 Potential to Achieve Goals: Good    Frequency 7X/week   Barriers to discharge Decreased caregiver support Lives alone    Co-evaluation               End of Session Equipment Utilized During Treatment: Gait belt;Oxygen Activity Tolerance: Patient tolerated treatment well Patient left: in chair;with call bell/phone within reach;with family/visitor present Nurse Communication: Mobility status         Time: [id number] PT Time Calculation (min) (ACUTE ONLY): 20 min   Charges:   PT Evaluation $Initial PT Evaluation Tier I: 1 Procedure     PT G Codes:        Tyreek Clabo A Avanna Sowder 12/26/2014, 4:56 PM Wray Kearns, [place], DPT [phone]

## 2014-12-26 NOTE — Progress Notes (Signed)
Orthopedic Tech Progress Note Patient Details:  Glenn Beck 28-Sep-1945 [id number]  CPM Right Knee CPM Right Knee: On Right Knee Flexion (Degrees): 90 Right Knee Extension (Degrees): 0 Additional Comments: Trapeze bar and foot roll   [name] 12/26/2014, 1:03 PM

## 2014-12-27 ENCOUNTER — Encounter (HOSPITAL_COMMUNITY): Payer: Self-pay | Admitting: Orthopedic Surgery

## 2014-12-27 LAB — CBC
HCT: 40.9 % (ref 39.0–52.0)
Hemoglobin: 13.3 g/dL (ref 13.0–17.0)
MCH: 31.3 pg (ref 26.0–34.0)
MCHC: 32.5 g/dL (ref 30.0–36.0)
MCV: 96.2 fL (ref 78.0–100.0)
Platelets: 184 10*3/uL (ref 150–400)
RBC: 4.25 MIL/uL (ref 4.22–5.81)
RDW: 13.6 % (ref 11.5–15.5)
WBC: 9 10*3/uL (ref 4.0–10.5)

## 2014-12-27 LAB — BASIC METABOLIC PANEL
Anion gap: 6 (ref 5–15)
BUN: 9 mg/dL (ref 6–20)
CO2: 30 mmol/L (ref 22–32)
Calcium: 8.4 mg/dL — ABNORMAL LOW (ref 8.9–10.3)
Chloride: 99 mmol/L — ABNORMAL LOW (ref 101–111)
Creatinine, Ser: 1.02 mg/dL (ref 0.61–1.24)
GFR calc Af Amer: >60 mL/min (ref >60)
GFR calc non Af Amer: >60 mL/min (ref >60)
Glucose, Bld: 101 mg/dL — ABNORMAL HIGH (ref 65–99)
Potassium: 5.1 mmol/L (ref 3.5–5.1)
Sodium: 135 mmol/L (ref 135–145)

## 2014-12-27 NOTE — Clinical Social Work Note (Signed)
Clinical Social Work Assessment  Patient Details  Name: Glenn Beck MRN: [id number] Date of Birth: 1945-04-18  Date of referral:  12/27/14               Reason for consult:  Facility Placement, Discharge Planning                Permission sought to share information with:  Chartered certified accountant granted to share information::  Yes, Verbal Permission Granted  Name::        Agency::  [facility]  Relationship::     Contact Information:     Housing/Transportation Living arrangements for the past 2 months:  Single Family Home Source of Information:  Patient Patient Interpreter Needed:  None Criminal Activity/Legal Involvement Pertinent to Current Situation/Hospitalization:  No - Comment as needed Significant Relationships:  [employer] Lives with:  Self Do you feel safe going back to the place where you live?  No (High fall risk.) Need for family participation in patient care:  No (Coment) (Patient able to make own decisions.)  Care giving concerns:  Patient expressed no concerns at this time.   Social Worker assessment / plan:  CSW received referral for possible SNF placement at time of discharge. CSW met with patient at bedside to discuss discharge planning. Patient had his sister-in-law, Vaughan Basta, and [place] visiting at bedside during assessment. Patient stated CSW could continue assessment with visitors present at bedside. Patient informed CSW patient from home alone. CSW educated patient on PT recommendation for SNF placement at time of discharge. Patient expressed understanding and stated patient to discharge to [facility] at time of discharge. CSW to continue to follow and assist with discharge planning.  Employment status:  Retired Forensic scientist:  Programmer, applications ([employer]) PT Recommendations:  [place] / Referral to community resources:  [place]  Patient/Family's Response to  care:  Patient understanding and agreeable to CSW plan of care.  Patient/Family's Understanding of and Emotional Response to Diagnosis, Current Treatment, and Prognosis:  Patient understanding and agreeable to CSW plan of care.  Emotional Assessment Appearance:  Appears stated age Attitude/Demeanor/Rapport:  Other (Pleasant.) Affect (typically observed):  Accepting, Appropriate, Pleasant Orientation:  Oriented to Self, Oriented to Place, Oriented to  Time, Oriented to Situation Alcohol / Substance use:  Not Applicable Psych involvement (Current and /or in the community):  No (Comment) (Not appropriate on this admission.)  Discharge Needs  Concerns to be addressed:  No discharge needs identified Readmission within the last 30 days:  No Current discharge risk:  None Barriers to Discharge:  No Barriers Identified   Caroline Sauger, LCSW 12/27/2014, 4:05 PM [phone]

## 2014-12-27 NOTE — Evaluation (Signed)
Occupational Therapy Evaluation Patient Details Name: Glenn Beck MRN: [id number] DOB: [birth year] Today's Date: 12/27/2014    History of Present Illness Patient is a 69 y/o male s/p Rt TKA. PMH includes ACDF C3-4, 5-6, HTN, MI, glaucoma, gout, anxiety.   Clinical Impression   Pt reports he was independent with ADLs and mobility PTA. Currently pt is overall min-mod assist for ADLs and functional mobility. Pt demonstrating poor safety awareness throughout session despite max verbal cues; pt attempting to transfer to toilet without RW, attempting to perform sit to stand pulling on RW for support. Began safety and ADL education with pt; he verbalized understanding. Pt planning to d/c to SNF ([facility] in [place]); agree with SNF for further rehab prior to returning home. Pt would benefit from continued skilled OT to maximize independence and safety with LB ADLs and toilet transfers with increased safety awareness.      Follow Up Recommendations  SNF;Supervision/Assistance - 24 hour    Equipment Recommendations  Other (comment) (TBD at next venue)    Recommendations for Other Services       Precautions / Restrictions Precautions Precautions: Knee Restrictions Weight Bearing Restrictions: Yes RLE Weight Bearing: Weight bearing as tolerated      Mobility Bed Mobility Overal bed mobility: Needs Assistance Bed Mobility: Sit to Supine       Sit to supine: Min assist;HOB elevated   General bed mobility comments: Assist to guide bilateral LEs into bed. Pt used overhead trapeze to pull self up once in bed.  Transfers Overall transfer level: Needs assistance Equipment used: Rolling walker (2 wheeled) Transfers: Sit to/from Stand Sit to Stand: Mod assist         General transfer comment: Mod assist to boost up from chair and BSC. Max verbal cues for hand placement and technique. Sit to stand from chair x 2, BSC x 1    Balance Overall balance assessment: Needs  assistance Sitting-balance support: Feet supported Sitting balance-Leahy Scale: Good     Standing balance support: During functional activity Standing balance-Leahy Scale: Poor Standing balance comment: Had to lean on sink with bilateral forearms to wash hands in standing                            ADL Overall ADL's : Needs assistance/impaired Eating/Feeding: Set up;Sitting   Grooming: Minimal assistance;Wash/dry hands;Standing Grooming Details (indicate cue type and reason): Min assist for balance in standing     Lower Body Bathing: Minimal assistance;Sit to/from stand       Lower Body Dressing: Minimal assistance;Sit to/from stand Lower Body Dressing Details (indicate cue type and reason): Pt able to reach bilateral feet. May have difficulty with socks/shoes and starting clothing over RLE. Educated on compensatory strategies for LB ADLs; pt verbalized understanding. Toilet Transfer: Moderate assistance;Ambulation;BSC;RW (BSC over toilet) Toilet Transfer Details (indicate cue type and reason): Mod assist to boost up from toilet, min assist during ambulation. Max verbal cues for safety with RW; pt with decreased safety awareness throughout functional mobility.  Toileting- Clothing Manipulation and Hygiene: Minimal assistance;Sit to/from stand       Functional mobility during ADLs: Minimal assistance;Rolling walker General ADL Comments: Pts sister in law present for OT eval. Educated on safety with RW, safety with transfers and functional mobility; pt verbalized understanding but did not show good safety awareness throughout session. Pt pushed RW to side and attempted to transfer to toilet with use of grab bars only; max verbal cues  needed for technique.     Vision     Perception     Praxis      Pertinent Vitals/Pain Pain Assessment: Faces Pain Score: 5  Faces Pain Scale: Hurts little more Pain Location: R knee Pain Descriptors / Indicators: Aching;Sore Pain  Intervention(s): Limited activity within patient's tolerance;Monitored during session;Premedicated before session;Repositioned     Hand Dominance Right   Extremity/Trunk Assessment Upper Extremity Assessment Upper Extremity Assessment: Generalized weakness   Lower Extremity Assessment Lower Extremity Assessment: Defer to PT evaluation   Cervical / Trunk Assessment Cervical / Trunk Assessment: Kyphotic   Communication Communication Communication: No difficulties   Cognition Arousal/Alertness: Awake/alert Behavior During Therapy: WFL for tasks assessed/performed Overall Cognitive Status: Within Functional Limits for tasks assessed       Memory: Decreased recall of precautions             General Comments       Exercises       Shoulder Instructions      Home Living Family/patient expects to be discharged to:: Skilled nursing facility ([facility] in [place]) Living Arrangements: Alone                                      Prior Functioning/Environment Level of Independence: Independent             OT Diagnosis: Generalized weakness;Acute pain   OT Problem List: Decreased strength;Decreased activity tolerance;Impaired balance (sitting and/or standing);Decreased safety awareness;Decreased knowledge of use of DME or AE;Decreased knowledge of precautions;Obesity;Pain   OT Treatment/Interventions: Self-care/ADL training;Patient/family education;DME and/or AE instruction    OT Goals(Current goals can be found in the care plan section) Acute Rehab OT Goals Patient Stated Goal: to go to rehab before returning home OT Goal Formulation: With patient Time For Goal Achievement: 01/10/15 Potential to Achieve Goals: Good ADL Goals Pt Will Perform Grooming: with min guard assist;standing Pt Will Perform Lower Body Bathing: with min guard assist;sit to/from stand Pt Will Perform Lower Body Dressing: with min guard assist;sit to/from stand Pt Will Transfer to  Toilet: with min guard assist;ambulating;bedside commode Pt Will Perform Toileting - Clothing Manipulation and hygiene: with min guard assist;sit to/from stand Additional ADL Goal #1: Pt will demonstrate good safety awareness throughout ADL activity  OT Frequency: Min 2X/week   Barriers to D/C: Decreased caregiver support  Pt lives alone       Co-evaluation              End of Session Equipment Utilized During Treatment: Gait belt;Rolling walker  Activity Tolerance: Patient tolerated treatment well Patient left: in bed;with call bell/phone within reach;with family/visitor present   Time: [id number] OT Time Calculation (min): 24 min Charges:  OT General Charges $OT Visit: 1 Procedure OT Evaluation $Initial OT Evaluation Tier I: 1 Procedure OT Treatments $Self Care/Home Management : 8-22 mins G-Codes:     Binnie Kand M.S., OTR/L Pager: [phone]  12/27/2014, 1:59 PM

## 2014-12-27 NOTE — Op Note (Signed)
TOTAL KNEE REPLACEMENT OPERATIVE NOTE:  12/26/2014  2:16 PM  PATIENT:  Glenn Beck  69 y.o. male  PRE-OPERATIVE DIAGNOSIS:  primary osteoarthritis right knee  POST-OPERATIVE DIAGNOSIS:  primary osteoarthritis right knee  PROCEDURE:  Procedure(s): TOTAL KNEE ARTHROPLASTY  SURGEON:  Surgeon(s): Vickey Huger, MD  PHYSICIAN ASSISTANT: Carlynn Spry, [facility]  ANESTHESIA:   spinal  DRAINS: Hemovac  SPECIMEN: None  COUNTS:  Correct  TOURNIQUET:   Total Tourniquet Time Documented: Thigh (Right) - 51 minutes Total: Thigh (Right) - 51 minutes   DICTATION:  Indication for procedure:    The patient is a 69 y.o. male who has failed conservative treatment for primary osteoarthritis right knee.  Informed consent was obtained prior to anesthesia. The risks versus benefits of the operation were explain and in a way the patient can, and did, understand.   On the implant demand matching protocol, this patient scored 8.  Therefore, this patient did" "did not receive a polyethylene insert with vitamin E which is a high demand implant.  Description of procedure:     The patient was taken to the operating room and placed under anesthesia.  The patient was positioned in the usual fashion taking care that all body parts were adequately padded and/or protected.  I foley catheter was not placed.  A tourniquet was applied and the leg prepped and draped in the usual sterile fashion.  The extremity was exsanguinated with the esmarch and tourniquet inflated to 350 mmHg.  Pre-operative range of motion was normal.  The knee was in 5 degree of mild varus.  A midline incision approximately 6-7 inches long was made with a #10 blade.  A new blade was used to make a parapatellar arthrotomy going 2-3 cm into the quadriceps tendon, over the patella, and alongside the medial aspect of the patellar tendon.  A synovectomy was then performed with the #10 blade and forceps. I then elevated the deep MCL off the  medial tibial metaphysis subperiosteally around to the semimembranosus attachment.    I everted the patella and used calipers to measure patellar thickness.  I used the reamer to ream down to appropriate thickness to recreate the native thickness.  I then removed excess bone with the rongeur and sagittal saw.  I used the appropriately sized template and drilled the three lug holes.  I then put the trial in place and measured the thickness with the calipers to ensure recreation of the native thickness.  The trial was then removed and the patella subluxed and the knee brought into flexion.  A homan retractor was place to retract and protect the patella and lateral structures.  A Z-retractor was place medially to protect the medial structures.  The extra-medullary alignment system was used to make cut the tibial articular surface perpendicular to the anamotic axis of the tibia and in 3 degrees of posterior slope.  The cut surface and alignment jig was removed.  I then used the intramedullary alignment guide to make a 6 valgus cut on the distal femur.  I then marked out the epicondylar axis on the distal femur.  The posterior condylar axis measured 3 degrees.  I then used the anterior referencing sizer and measured the femur to be a size 10.  The 4-In-1 cutting block was screwed into place in external rotation matching the posterior condylar angle, making our cuts perpendicular to the epicondylar axis.  Anterior, posterior and chamfer cuts were made with the sagittal saw.  The cutting block and cut pieces  were removed.  A lamina spreader was placed in 90 degrees of flexion.  The ACL, PCL, menisci, and posterior condylar osteophytes were removed.  A 10 mm spacer blocked was found to offer good flexion and extension gap balance after minimal in degree releasing.   The scoop retractor was then placed and the femoral finishing block was pinned in place.  The small sagittal saw was used as well as the lug drill to  finish the femur.  The block and cut surfaces were removed and the medullary canal hole filled with autograft bone from the cut pieces.  The tibia was delivered forward in deep flexion and external rotation.  A size F tray was selected and pinned into place centered on the medial 1/3 of the tibial tubercle.  The reamer and keel was used to prepare the tibia through the tray.    I then trialed with the size 10 femur, size F tibia, a 10 mm insert and the 35 patella.  I had excellent flexion/extension gap balance, excellent patella tracking.  Flexion was full and beyond 120 degrees; extension was zero.  These components were chosen and the staff opened them to me on the back table while the knee was lavaged copiously and the cement mixed.  The soft tissue was infiltrated with 60cc of exparel 1.3% through a 21 gauge needle.  I cemented in the components and removed all excess cement.  The polyethylene tibial component was snapped into place and the knee placed in extension while cement was hardening.  The capsule was infilltrated with 30cc of .25% Marcaine with epinephrine.  A hemovac was place in the joint exiting superolaterally.  A pain pump was place superomedially superficial to the arthrotomy.  Once the cement was hard, the tourniquet was let down.  Hemostasis was obtained.  The arthrotomy was closed with figure-8 #1 vicryl sutures.  The deep soft tissues were closed with #0 vicryls and the subcuticular layer closed with a running #2-0 vicryl.  The skin was reapproximated and closed with skin staples.  The wound was dressed with xeroform, 4 x4's, 2 ABD sponges, a single layer of webril and a TED stocking.   The patient was then awakened, extubated, and taken to the recovery room in stable condition.  BLOOD LOSS:  300cc DRAINS: 1 hemovac, 1 pain catheter COMPLICATIONS:  None.  PLAN OF CARE: Admit to inpatient   PATIENT DISPOSITION:  PACU - hemodynamically stable.   Delay start of Pharmacological  VTE agent (>24hrs) due to surgical blood loss or risk of bleeding:  not applicable  Please fax a copy of this op note to my office at [phone] (please only include page 1 and 2 of the Case Information op note)

## 2014-12-27 NOTE — Progress Notes (Signed)
Orthopedic Tech Progress Note Patient Details:  Glenn Beck [birth year] [id number] On cpm at 1920 Patient ID: Jarold Motto, male   DOB: 1945-08-08, 69 y.o.   MRN: [id number]   Braulio Bosch 12/27/2014, 7:18 PM

## 2014-12-27 NOTE — Care Management Note (Signed)
Case Management Note  Patient Details  Name: BRENDA MILUM MRN: [id number] Date of Birth: [birth year]  Subjective/Objective:         S/p right total hip arthroplasty           Action/Plan: PT recommended SNF. Referral made to CSW. Will continue to follow   Expected Discharge Date:                  Expected Discharge Plan:  [place]  In-House Referral:  Clinical Social Work  Discharge planning Services  CM Consult  Post Acute Care Choice:    Choice offered to:     DME Arranged:    DME Agency:     HH Arranged:    [place] Agency:     Status of Service:  In process, will continue to follow  Medicare Important Message Given:    Date Medicare IM Given:    Medicare IM give by:    Date Additional Medicare IM Given:    Additional Medicare Important Message give by:     If discussed at [place] of Stay Meetings, dates discussed:    Additional Comments:  Nila Nephew, RN 12/27/2014, 12:07 PM

## 2014-12-27 NOTE — Clinical Social Work Placement (Signed)
   CLINICAL SOCIAL WORK PLACEMENT  NOTE  Date:  12/27/2014  Patient Details  Name: Glenn Beck MRN: [id number] Date of Birth: 12-29-1945  Clinical Social Work is seeking post-discharge placement for this patient at the [place] level of care (*CSW will initial, date and re-position this form in  chart as items are completed):  Yes   Patient/family provided with [place] Work Department's list of facilities offering this level of care within the geographic area requested by the patient (or if unable, by the patient's family).  Yes   Patient/family informed of their freedom to choose among providers that offer the needed level of care, that participate in Medicare, Medicaid or managed care program needed by the patient, have an available bed and are willing to accept the patient.  Yes   Patient/family informed of [facility]'s ownership interest in [facility] and [facility], as well as of the fact that they are under no obligation to receive care at these facilities.  PASRR submitted to EDS on 12/27/14     PASRR number received on 12/27/14     Existing PASRR number confirmed on       FL2 transmitted to all facilities in geographic area requested by pt/family on 12/27/14     FL2 transmitted to all facilities within larger geographic area on       Patient informed that his/her managed care company has contracts with or will negotiate with certain facilities, including the following:            Patient/family informed of bed offers received.  Patient chooses bed at       Physician recommends and patient chooses bed at      Patient to be transferred to   on  .  Patient to be transferred to facility by       Patient family notified on   of transfer.  Name of family member notified:        PHYSICIAN Please sign FL2     Additional Comment:    _______________________________________________ Caroline Sauger, LCSW 12/27/2014,  4:13 PM

## 2014-12-27 NOTE — Progress Notes (Signed)
SPORTS MEDICINE AND JOINT REPLACEMENT  Lara Mulch, MD   Carlynn Spry, PA-C [place], [place], [place]  [address]                             [phone]   PROGRESS NOTE  Subjective:  negative for Chest Pain  negative for Shortness of Breath  negative for Nausea/Vomiting   negative for Calf Pain  negative for Bowel Movement   Tolerating Diet: yes         Patient reports pain as 5 on 0-10 scale.    Objective: Vital signs in last 24 hours:   Patient Vitals for the past 24 hrs:  BP Temp Temp src Pulse Resp SpO2  12/27/14 1100 (!) 84/42 mmHg 99.5 F (37.5 C) Oral 89 18 90 %  12/27/14 0543 (!) 92/37 mmHg 98 F (36.7 C) Oral 92 16 96 %  12/26/14 2347 (!) 125/103 mmHg 97.9 F (36.6 C) Oral 76 16 97 %  12/26/14 2052 114/62 mmHg - - - - -  12/26/14 2049 (!) 87/74 mmHg 97.8 F (36.6 C) Oral 76 16 98 %  12/26/14 1504 (!) 128/93 mmHg 97.7 F (36.5 C) Oral 66 15 99 %  12/26/14 1430 112/68 mmHg 97.8 F (36.6 C) - 69 17 100 %  12/26/14 1415 (!) 109/57 mmHg - - 64 10 100 %  12/26/14 1400 108/61 mmHg - - 65 15 100 %  12/26/14 1345 102/71 mmHg - - 72 19 100 %  12/26/14 1330 93/63 mmHg - - 70 15 100 %    @flow {1959:LAST@   Intake/Output from previous day:   12/12 0701 - 12/13 0700 In: 3325 [I.V.:3225] Out: 1550 [Urine:1500]   Intake/Output this shift:   12/13 0701 - 12/13 1900 In: 470 [P.O.:470] Out: 700 [Urine:700]   Intake/Output      12/12 0701 - 12/13 0700 12/13 0701 - 12/14 0700   P.O.  470   I.V. (mL/kg) 3225 (25.1)    IV Piggyback 100    Total Intake(mL/kg) 3325 (25.9) 470 (3.7)   Urine (mL/kg/hr) 1500 700 (0.8)   Blood 50    Total Output 1550 700   Net +1775 -230        Urine Occurrence 1 x       LABORATORY DATA:  Recent Labs  12/26/14 1645 12/27/14 0600  WBC 9.2 9.0  HGB 14.6 13.3  HCT 44.1 40.9  PLT 198 184    Recent Labs  12/26/14 1645 12/27/14 0600  NA  --  135  K  --  5.1  CL  --  99*  CO2  --  30  BUN  --  9  CREATININE  1.03 1.02  GLUCOSE  --  101*  CALCIUM  --  8.4*   Lab Results  Component Value Date   INR 1.10 12/16/2014    Examination:  General appearance: alert, cooperative and no distress Extremities: Homans sign is negative, no sign of DVT  Wound Exam: clean, dry, intact   Drainage:  Scant/small amount Serosanguinous exudate  Motor Exam: Quadriceps and Hamstrings Intact  Sensory Exam: Deep Peroneal normal   Assessment:    1 Day Post-Op  Procedure(s) (LRB): TOTAL KNEE ARTHROPLASTY (Right)  ADDITIONAL DIAGNOSIS:  Active Problems:   S/P total knee arthroplasty   S/P total knee replacement  Acute Blood Loss Anemia   Plan: Physical Therapy as ordered Weight Bearing as Tolerated (WBAT)  DVT Prophylaxis:  Lovenox  DISCHARGE PLAN: SNF  DISCHARGE NEEDS: HHPT, CPM, Walker and 3-in-1 comode seat         Jorel Gravlin 12/27/2014, 1:27 PM

## 2014-12-27 NOTE — NC FL2 (Signed)
[place] MEDICAID FL2 LEVEL OF CARE SCREENING TOOL     IDENTIFICATION  Patient Name: Glenn Beck Birthdate: 12-28-1945 Sex: male Admission Date (Current Location): 12/26/2014  [facility] and [place] Number:     Facility and Address:  [facility], [place] [address], [place], [place] [address]      Provider Number: [id number]  Attending Physician Name and Address:  Vickey Huger, MD  Relative Name and Phone Number:       Current Level of Care: Hospital Recommended Level of Care: [place] Prior Approval Number:    Date Approved/Denied:   PASRR Number: [id number]  Discharge Plan: SNF    Current Diagnoses: Patient Active Problem List   Diagnosis Date Noted  . S/P total knee arthroplasty 12/26/2014  . S/P total knee replacement 12/26/2014  . Cervical spondylosis with myelopathy 04/19/2014    Orientation RESPIRATION BLADDER Height & Weight    Self, Time, Situation, Place  Normal Continent 5\' 10"  (177.8 cm) 283 lbs.  BEHAVIORAL SYMPTOMS/MOOD NEUROLOGICAL BOWEL NUTRITION STATUS  Other (Comment) (n/a)  (n/a) Continent Diet (Please see discharge summary.)  AMBULATORY STATUS COMMUNICATION OF NEEDS Skin   Limited Assist Verbally Surgical wounds                       Personal Care Assistance Level of Assistance  Bathing, Feeding, Dressing Bathing Assistance: Limited assistance Feeding assistance: Limited assistance Dressing Assistance: Limited assistance     Functional Limitations Info   (n/a)          [place]  PT (By licensed PT), OT (By licensed OT)     PT Frequency: 5 OT Frequency: 5            Contractures      Additional Factors Info  Code Status, Allergies Code Status Info: FULL Allergies Info: Nasacort           Current Medications (12/27/2014):  This is the current hospital active medication list Current Facility-Administered Medications  Medication Dose Route  Frequency Provider Last Rate Last Dose  . 0.9 %  sodium chloride infusion   Intravenous Continuous Carlynn Spry, PA-C 75 mL/hr at 12/27/14 0330    . acetaminophen (TYLENOL) tablet 650 mg  650 mg Oral Q6H PRN Carlynn Spry, PA-C       Or  . acetaminophen (TYLENOL) suppository 650 mg  650 mg Rectal Q6H PRN Carlynn Spry, PA-C      . ALPRAZolam Duanne Moron) tablet 0.5 mg  0.5 mg Oral Q8H PRN Carlynn Spry, PA-C      . alum & mag hydroxide-simeth (MAALOX/MYLANTA) 200-200-20 MG/5ML suspension 30 mL  30 mL Oral Q4H PRN Carlynn Spry, PA-C      . bisacodyl (DULCOLAX) EC tablet 5 mg  5 mg Oral Daily PRN Carlynn Spry, PA-C      . celecoxib (CELEBREX) capsule 200 mg  200 mg Oral Q12H Carlynn Spry, PA-C   200 mg at 12/27/14 0909  . dicyclomine (BENTYL) capsule 10 mg  10 mg Oral QID PRN Carlynn Spry, PA-C      . diphenhydrAMINE (BENADRYL) 12.5 MG/5ML elixir 12.5-25 mg  12.5-25 mg Oral Q4H PRN Carlynn Spry, PA-C      . docusate sodium (COLACE) capsule 100 mg  100 mg Oral BID Carlynn Spry, PA-C   100 mg at 12/27/14 0909  . enoxaparin (LOVENOX) injection 30 mg  30 mg Subcutaneous Q12H Carlynn Spry, PA-C   30 mg at 12/27/14 [id number]  .  lisinopril (PRINIVIL,ZESTRIL) tablet 20 mg  20 mg Oral Daily Vickey Huger, MD   20 mg at 12/27/14 [id number]   And  . hydrochlorothiazide (MICROZIDE) capsule 12.5 mg  12.5 mg Oral Daily Vickey Huger, MD   12.5 mg at 12/27/14 0909  . HYDROmorphone (DILAUDID) injection 1 mg  1 mg Intravenous Q2H PRN Carlynn Spry, PA-C   1 mg at 12/27/14 1159  . latanoprost (XALATAN) 0.005 % ophthalmic solution 1 drop  1 drop Both Eyes QHS Carlynn Spry, PA-C   1 drop at 12/26/14 2306  . levothyroxine (SYNTHROID, LEVOTHROID) tablet 200 mcg  200 mcg Oral QAC breakfast Carlynn Spry, PA-C   200 mcg at 12/27/14 0640  . loratadine (CLARITIN) tablet 10 mg  10 mg Oral Daily PRN Carlynn Spry, PA-C      . menthol-cetylpyridinium (CEPACOL) lozenge 3 mg  1 lozenge Oral PRN Carlynn Spry, PA-C       Or  . phenol  (CHLORASEPTIC) mouth spray 1 spray  1 spray Mouth/Throat PRN Carlynn Spry, PA-C      . methocarbamol (ROBAXIN) tablet 500 mg  500 mg Oral Q6H PRN Carlynn Spry, PA-C   500 mg at 12/27/14 0930   Or  . methocarbamol (ROBAXIN) 500 mg in dextrose 5 % 50 mL IVPB  500 mg Intravenous Q6H PRN Carlynn Spry, PA-C      . metoCLOPramide (REGLAN) tablet 5-10 mg  5-10 mg Oral Q8H PRN Carlynn Spry, PA-C       Or  . metoCLOPramide (REGLAN) injection 5-10 mg  5-10 mg Intravenous Q8H PRN Carlynn Spry, PA-C      . metoprolol succinate (TOPROL-XL) 24 hr tablet 50 mg  50 mg Oral Daily Carlynn Spry, PA-C   50 mg at 12/27/14 0909  . ondansetron (ZOFRAN) tablet 4 mg  4 mg Oral Q6H PRN Carlynn Spry, PA-C       Or  . ondansetron [facility]) injection 4 mg  4 mg Intravenous Q6H PRN Carlynn Spry, PA-C      . oxyCODONE (Oxy IR/ROXICODONE) immediate release tablet 5-10 mg  5-10 mg Oral Q3H PRN Carlynn Spry, PA-C   10 mg at 12/27/14 0641  . oxyCODONE (OXYCONTIN) 12 hr tablet 10 mg  10 mg Oral Q12H Carlynn Spry, PA-C   10 mg at 12/27/14 0919  . pantoprazole (PROTONIX) EC tablet 40 mg  40 mg Oral Daily Carlynn Spry, PA-C   40 mg at 12/27/14 [id number]  . senna-docusate (Senokot-S) tablet 1 tablet  1 tablet Oral QHS PRN Carlynn Spry, PA-C      . sodium phosphate (FLEET) 7-19 GM/118ML enema 1 enema  1 enema Rectal Once PRN Carlynn Spry, PA-C      . triazolam (HALCION) tablet 0.25 mg  0.25 mg Oral QHS PRN Carlynn Spry, PA-C         Discharge Medications: Please see discharge summary for a list of discharge medications.  Relevant Imaging Results:  Relevant Lab Results:   Additional Information Social Security: [id number]  Luna Kitchens [phone]

## 2014-12-27 NOTE — Progress Notes (Signed)
Physical Therapy Treatment Patient Details Name: Glenn Beck MRN: [id number] DOB: 08/20/1945 Today's Date: 12/27/2014    History of Present Illness Patient is a 69 y/o male s/p Rt TKA. PMH includes ACDF C3-4, 5-6, HTN, MI, glaucoma, gout, anxiety.    PT Comments    Pt able to ambulate to bathroom today and back with MIN to MOD A due to POOR safety with turning to sit.  Lets go of RW too early and pivots.  Antalgic gait pattern with difficulty with bathroom threshold.  Would have a 2 nd person for longer distance gait for chair follow as L LE appeared fatigued.  Pt did state he felt he did better yesterday.  Con't to recommend SNF. Nursing aware that pt is up in chair and need for cues with  SPT for safe hand placement.  Follow Up Recommendations  SNF     Equipment Recommendations  None recommended by PT    Recommendations for Other Services       Precautions / Restrictions Precautions Precautions: Knee Restrictions Weight Bearing Restrictions: Yes RLE Weight Bearing: Weight bearing as tolerated    Mobility  Bed Mobility               General bed mobility comments: Sitting EOB upon arrival  Transfers Overall transfer level: Needs assistance Equipment used: Rolling walker (2 wheeled) Transfers: Sit to/from Stand Sit to Stand: Min assist         General transfer comment: multi-modal cueing for safe technique  Ambulation/Gait Ambulation/Gait assistance: Min assist;Mod assist Ambulation Distance (Feet): 10 Feet (x2) Assistive device: Rolling walker (2 wheeled) Gait Pattern/deviations: Antalgic;Decreased step length - right;Decreased step length - left Gait velocity: decreased   General Gait Details: Amb into bathroom and neede MOD A with managing unlevel entry.  Pt used bathroom and then ambulated back to recliner with antalgic pattern and increased L knee flexion during L stance phase.  With sitting on both toilet and to recliner, pt demonstrated POOR  safety, letting go of RW too early and turnig buttocks to sit.   Stairs            Wheelchair Mobility    Modified Rankin (Stroke Patients Only)       Balance Overall balance assessment: Needs assistance Sitting-balance support: Feet supported Sitting balance-Leahy Scale: Good     Standing balance support: Bilateral upper extremity supported Standing balance-Leahy Scale: Poor Standing balance comment: requires RW                    Cognition Arousal/Alertness: Awake/alert Behavior During Therapy: WFL for tasks assessed/performed Overall Cognitive Status: Within Functional Limits for tasks assessed                      Exercises Total Joint Exercises Ankle Circles/Pumps: Both;10 reps;Supine Quad Sets: Both;10 reps;Supine Heel Slides: AAROM;Right;10 reps;Supine Long Arc Quad: AAROM;5 reps;Right;Seated    General Comments        Pertinent Vitals/Pain Pain Assessment: 0-10 Pain Score: 5  Pain Location: R knee Pain Descriptors / Indicators: Sore Pain Intervention(s): Premedicated before session;Limited activity within patient's tolerance;Monitored during session;Repositioned    Home Living                      Prior Function            PT Goals (current goals can now be found in the care plan section) Acute Rehab PT Goals Patient Stated Goal: to go  to rehab before returning home PT Goal Formulation: With patient Time For Goal Achievement: 01/09/15 Potential to Achieve Goals: Good Progress towards PT goals: Progressing toward goals    Frequency  7X/week    PT Plan Current plan remains appropriate    Co-evaluation             End of Session Equipment Utilized During Treatment: Gait belt Activity Tolerance: Patient tolerated treatment well Patient left: in chair;with call bell/phone within reach     Time: [id number] PT Time Calculation (min) (ACUTE ONLY): 19 min  Charges:  $Gait Training: 8-22 mins                     G Codes:      Kyllie Pettijohn LUBECK 12/27/2014, 11:07 AM

## 2014-12-28 DIAGNOSIS — Z96651 Presence of right artificial knee joint: Secondary | ICD-10-CM | POA: Diagnosis not present

## 2014-12-28 DIAGNOSIS — I10 Essential (primary) hypertension: Secondary | ICD-10-CM | POA: Diagnosis not present

## 2014-12-28 DIAGNOSIS — H409 Unspecified glaucoma: Secondary | ICD-10-CM | POA: Diagnosis not present

## 2014-12-28 DIAGNOSIS — Z4789 Encounter for other orthopedic aftercare: Secondary | ICD-10-CM | POA: Diagnosis not present

## 2014-12-28 DIAGNOSIS — M25461 Effusion, right knee: Secondary | ICD-10-CM | POA: Diagnosis not present

## 2014-12-28 DIAGNOSIS — R262 Difficulty in walking, not elsewhere classified: Secondary | ICD-10-CM | POA: Diagnosis not present

## 2014-12-28 DIAGNOSIS — I252 Old myocardial infarction: Secondary | ICD-10-CM | POA: Diagnosis not present

## 2014-12-28 DIAGNOSIS — G8918 Other acute postprocedural pain: Secondary | ICD-10-CM | POA: Diagnosis not present

## 2014-12-28 DIAGNOSIS — M1711 Unilateral primary osteoarthritis, right knee: Secondary | ICD-10-CM | POA: Diagnosis not present

## 2014-12-28 DIAGNOSIS — Z471 Aftercare following joint replacement surgery: Secondary | ICD-10-CM | POA: Diagnosis not present

## 2014-12-28 DIAGNOSIS — I119 Hypertensive heart disease without heart failure: Secondary | ICD-10-CM | POA: Diagnosis not present

## 2014-12-28 DIAGNOSIS — J45909 Unspecified asthma, uncomplicated: Secondary | ICD-10-CM | POA: Diagnosis not present

## 2014-12-28 LAB — CBC
HCT: 39.2 % (ref 39.0–52.0)
Hemoglobin: 12.8 g/dL — ABNORMAL LOW (ref 13.0–17.0)
MCH: 31.4 pg (ref 26.0–34.0)
MCHC: 32.7 g/dL (ref 30.0–36.0)
MCV: 96.3 fL (ref 78.0–100.0)
Platelets: 176 10*3/uL (ref 150–400)
RBC: 4.07 MIL/uL — ABNORMAL LOW (ref 4.22–5.81)
RDW: 13.4 % (ref 11.5–15.5)
WBC: 9.8 10*3/uL (ref 4.0–10.5)

## 2014-12-28 MED ORDER — OXYCODONE HCL ER 10 MG PO T12A: 10.0000 mg | EXTENDED_RELEASE_TABLET | Freq: Two times a day (BID) | ORAL | Status: DC

## 2014-12-28 MED ORDER — ENOXAPARIN SODIUM 40 MG/0.4ML ~~LOC~~ SOLN: 40.0000 mg | SUBCUTANEOUS | Status: DC

## 2014-12-28 MED ORDER — OXYCODONE HCL 5 MG PO TABS: 5.0000 mg | ORAL_TABLET | ORAL | Status: DC | PRN

## 2014-12-28 MED ORDER — METHOCARBAMOL 500 MG PO TABS: 500.0000 mg | ORAL_TABLET | Freq: Four times a day (QID) | ORAL | Status: DC | PRN

## 2014-12-28 NOTE — Progress Notes (Signed)
Physical Therapy Treatment Patient Details Name: Glenn Beck MRN: [id number] DOB: January 06, 1946 Today's Date: 12/28/2014    History of Present Illness Patient is a 69 y/o male s/p Rt TKA. PMH includes ACDF C3-4, 5-6, HTN, MI, glaucoma, gout, anxiety.    PT Comments    Pt is progressing better with his gait and safety during gait with cues.  He had, but did not need chair to follow him into the hallway today and did much better with safety when going to sit.  He continues to benefit from acute PT and we continue to recommend SNF level rehab at discharge.    Follow Up Recommendations  SNF     Equipment Recommendations  None recommended by PT    Recommendations for Other Services   NA     Precautions / Restrictions Precautions Precautions: Knee Precaution Booklet Issued: Yes (comment) Precaution Comments: knee handout given Restrictions RLE Weight Bearing: Weight bearing as tolerated    Mobility  Bed Mobility Overal bed mobility: Needs Assistance Bed Mobility: Supine to Sit     Supine to sit: Min assist     General bed mobility comments: Min assist to help progress right leg to EOB.  Pt needed verbal cues for sequencing and hand placement.  Used trapeeze and bed rails for leverage at trunk.   Transfers Overall transfer level: Needs assistance Equipment used: Rolling walker (2 wheeled) Transfers: Sit to/from Stand Sit to Stand: Min assist;Mod assist         General transfer comment: Up to mod assist to support trunk during transitions depending on height of sitting surface (better from higher bed- min, worse from lower toilet- mod).  Pt needs verbal cues for safe hand palcement during transitions and safety when going to prepare to sit.   Ambulation/Gait Ambulation/Gait assistance: Min assist Ambulation Distance (Feet): 25 Feet Assistive device: Rolling walker (2 wheeled) Gait Pattern/deviations: Step-to pattern;Trunk flexed Gait velocity: decreased Gait  velocity interpretation: Below normal speed for age/gender General Gait Details: Pt with significant forward flexed trunk that he corrects with cues, but cannot maintain. Verbal cues for safe RW use (proximity to RW) especially when navigating the unlevel floor into and out of the bathroom. Chair to follow to try to encourage increased gait distance, but not needed.           Balance Overall balance assessment: Needs assistance Sitting-balance support: Feet supported;No upper extremity supported Sitting balance-Leahy Scale: Good     Standing balance support: Bilateral upper extremity supported Standing balance-Leahy Scale: Poor Standing balance comment: needs support of therapist and RW during standing.                     Cognition Arousal/Alertness: Awake/alert Behavior During Therapy: Impulsive (mildly) Overall Cognitive Status: Within Functional Limits for tasks assessed                      Exercises Total Joint Exercises Ankle Circles/Pumps: AROM;Both;10 reps Quad Sets: AROM;Right;10 reps Towel Squeeze: AROM;Both;10 reps Heel Slides: AAROM;Right;10 reps        Pertinent Vitals/Pain Pain Assessment: 0-10 Pain Score: 6  Pain Location: right knee Pain Descriptors / Indicators: Aching Pain Intervention(s): Limited activity within patient's tolerance;Monitored during session;Repositioned;Premedicated before session           PT Goals (current goals can now be found in the care plan section) Acute Rehab PT Goals Patient Stated Goal: to go to rehab before returning home Progress towards PT goals: Progressing  toward goals    Frequency  7X/week    PT Plan Current plan remains appropriate       End of Session Equipment Utilized During Treatment: Gait belt Activity Tolerance: Patient limited by fatigue;Patient limited by pain Patient left: in chair;with call bell/phone within reach;with chair alarm set     Time: [id number] PT Time Calculation  (min) (ACUTE ONLY): 30 min  Charges:  $Gait Training: 8-22 mins $Therapeutic Exercise: 8-22 mins                      Glenn Beck, PT, DPT [phone]   12/28/2014, 11:34 AM

## 2014-12-28 NOTE — Discharge Instructions (Signed)

## 2014-12-28 NOTE — Progress Notes (Signed)
Called report to [employer] Gave report to RN

## 2014-12-28 NOTE — Progress Notes (Signed)
Physical Therapy Treatment Patient Details Name: Glenn Beck MRN: [id number] DOB: 03-07-1945 Today's Date: 12/28/2014    History of Present Illness Patient is a 69 y/o male s/p Rt TKA. PMH includes ACDF C3-4, 5-6, HTN, MI, glaucoma, gout, anxiety.    PT Comments    Pt continues to progress with every session, albeit slowly due to poor endurance and strength. He may d/c to SNF this PM for continued therapy.  PT will continue to follow acutely until d/c confirmed.   Follow Up Recommendations  SNF     Equipment Recommendations  None recommended by PT    Recommendations for Other Services   NA     Precautions / Restrictions Precautions Precautions: Knee Precaution Booklet Issued: Yes (comment) Precaution Comments: knee handout given Restrictions RLE Weight Bearing: Weight bearing as tolerated    Mobility  Bed Mobility Overal bed mobility: Needs Assistance Bed Mobility: Supine to Sit     Supine to sit: Min assist Sit to supine: Min assist   General bed mobility comments: Min assist to support leg to get back into bed. Verbal cues for technique.   Transfers Overall transfer level: Needs assistance Equipment used: Rolling walker (2 wheeled) Transfers: Sit to/from Stand Sit to Stand: Min assist         General transfer comment: Min assist to support trunk during transitions. Verbal cues for safe hand placement.   Ambulation/Gait Ambulation/Gait assistance: Min assist Ambulation Distance (Feet): 25 Feet Assistive device: Rolling walker (2 wheeled) Gait Pattern/deviations: Step-to pattern;Antalgic;Trunk flexed Gait velocity: decreased Gait velocity interpretation: Below normal speed for age/gender General Gait Details: Pt with moderately antalgic gait pattern and forward flexed trunk.  Verbal cues for upright posture, RW safety.            Balance Overall balance assessment: Needs assistance Sitting-balance support: Feet supported;No upper extremity  supported Sitting balance-Leahy Scale: Good     Standing balance support: Bilateral upper extremity supported Standing balance-Leahy Scale: Poor Standing balance comment: needs support from RW and PT                    Cognition Arousal/Alertness: Awake/alert Behavior During Therapy: WFL for tasks assessed/performed Overall Cognitive Status: Within Functional Limits for tasks assessed                      Exercises  Goniometric ROM: 12-70 AAROM in recliner chair        Pertinent Vitals/Pain Pain Assessment: 0-10 Pain Score: 5  Pain Location: right knee Pain Descriptors / Indicators: Aching Pain Intervention(s): Limited activity within patient's tolerance;Monitored during session;Repositioned           PT Goals (current goals can now be found in the care plan section) Acute Rehab PT Goals Patient Stated Goal: to go to rehab before returning home Progress towards PT goals: Progressing toward goals    Frequency  7X/week    PT Plan Current plan remains appropriate       End of Session Equipment Utilized During Treatment: Gait belt Activity Tolerance: Patient limited by fatigue;Patient limited by pain Patient left: in bed;with call bell/phone within reach;in CPM     Time: [id number] PT Time Calculation (min) (ACUTE ONLY): 16 min  Charges:  $Gait Training: 8-22 mins $Therapeutic Exercise: 8-22 mins                      Camara Renstrom B. Hadassa Cermak, PT, DPT [phone]   12/28/2014, 2:27 PM

## 2014-12-28 NOTE — Progress Notes (Signed)
SPORTS MEDICINE AND JOINT REPLACEMENT  Lara Mulch, MD   Carlynn Spry, PA-C [place], [place], [place]  [address]                             [phone]   PROGRESS NOTE  Subjective:  negative for Chest Pain  negative for Shortness of Breath  negative for Nausea/Vomiting   negative for Calf Pain  negative for Bowel Movement   Tolerating Diet: yes         Patient reports pain as 3 on 0-10 scale.    Objective: Vital signs in last 24 hours:   Patient Vitals for the past 24 hrs:  BP Temp Temp src Pulse Resp SpO2  12/28/14 0805 (!) 90/58 mmHg - - 78 - -  12/28/14 0517 (!) 95/54 mmHg 98.7 F (37.1 C) Oral 73 16 93 %  12/27/14 2100 (!) 108/50 mmHg 98.5 F (36.9 C) Oral 78 14 91 %  12/27/14 1159 (!) 92/48 mmHg - - - - -  12/27/14 1100 (!) 84/42 mmHg 99.5 F (37.5 C) Oral 89 18 90 %    @flow {1959:LAST@   Intake/Output from previous day:   12/13 0701 - 12/14 0700 In: 750 [P.O.:750] Out: 2525 [Urine:2525]   Intake/Output this shift:       Intake/Output      12/13 0701 - 12/14 0700 12/14 0701 - 12/15 0700   P.O. 750    I.V. (mL/kg)     IV Piggyback     Total Intake(mL/kg) 750 (5.8)    Urine (mL/kg/hr) 2525 (0.8)    Blood     Total Output 2525     Net -1775             LABORATORY DATA:  Recent Labs  12/26/14 1645 12/27/14 0600 12/28/14 0526  WBC 9.2 9.0 9.8  HGB 14.6 13.3 12.8*  HCT 44.1 40.9 39.2  PLT 198 184 176    Recent Labs  12/26/14 1645 12/27/14 0600  NA  --  135  K  --  5.1  CL  --  99*  CO2  --  30  BUN  --  9  CREATININE 1.03 1.02  GLUCOSE  --  101*  CALCIUM  --  8.4*   Lab Results  Component Value Date   INR 1.10 12/16/2014    Examination:  General appearance: alert, cooperative and no distress Extremities: Homans sign is negative, no sign of DVT  Wound Exam: clean, dry, intact   Drainage:  Scant/small amount Serous exudate  Motor Exam: Quadriceps and Hamstrings Intact  Sensory Exam: Deep Peroneal  normal   Assessment:    2 Days Post-Op  Procedure(s) (LRB): TOTAL KNEE ARTHROPLASTY (Right)  ADDITIONAL DIAGNOSIS:  Active Problems:   S/P total knee arthroplasty   S/P total knee replacement  Acute Blood Loss Anemia   Plan: Physical Therapy as ordered Weight Bearing as Tolerated (WBAT)  DVT Prophylaxis:  Lovenox  DISCHARGE PLAN: Skilled Nursing Facility/Rehab          Askia Hazelip 12/28/2014, 8:20 AM

## 2014-12-28 NOTE — Discharge Summary (Signed)
SPORTS MEDICINE & JOINT REPLACEMENT   Lara Mulch, MD   Carlynn Spry, PA-C [address], [place], [place]  [address]                             [phone]  PATIENT ID: Glenn Beck        MRN:  [id number]          DOB/AGE: 1945/03/26 / 69 y.o.    DISCHARGE SUMMARY  ADMISSION DATE:    12/26/2014 DISCHARGE DATE:   12/28/2014   ADMISSION DIAGNOSIS: primary osteoarthritis right knee    DISCHARGE DIAGNOSIS:  primary osteoarthritis right knee    ADDITIONAL DIAGNOSIS: Active Problems:   S/P total knee arthroplasty   S/P total knee replacement  Past Medical History  Diagnosis Date  . Glaucoma     uses eye drops  . Hypertension     takes Lisinopril and Metoprolol daily  . Myocardial infarction ([place]) >38yrs ago  . Asthma     bronchial  . Shortness of breath dyspnea     with exertion  . Pneumonia 22yrs ago  . Headache     r/t neck issues  . Joint pain   . Chronic back pain   . Gout     on study medications for this  . GERD (gastroesophageal reflux disease)     takes Nexium daily as needed  . History of colon polyps     benign  . Diverticulosis     takes Bentyl daily as needed  . Kidney infection     beint treated at present with Amoxicillin   . Hypothyroidism     takes Synthroid daily  . Anxiety     takes Xanax daily as needed  . Insomnia     takes Triazolam nightly as needed  . History of shingles   . Swallowing difficulty     has been evaluated at [facility] for this problem since Cervical Fusion  . Arthritis     "all over my body"  . Complication of anesthesia     pt. reports swallowing has been difficult since having ACDF- 2016, has been eval. by ENT & testing done at [facility].     PROCEDURE: Procedure(s): TOTAL KNEE ARTHROPLASTY on 12/26/2014  CONSULTS:     HISTORY:  See H&P in chart  HOSPITAL COURSE:  Glenn Beck is a 69 y.o. admitted on 12/26/2014 and found to have a diagnosis of primary osteoarthritis right knee.   After appropriate laboratory studies were obtained  they were taken to the operating room on 12/26/2014 and underwent Procedure(s): TOTAL KNEE ARTHROPLASTY.   They were given perioperative antibiotics:  Anti-infectives    Start     Dose/Rate Route Frequency Ordered Stop   12/26/14 1630  ceFAZolin (ANCEF) IVPB 2 g/50 mL premix     2 g 100 mL/hr over 30 Minutes Intravenous Every 6 hours 12/26/14 1454 12/27/14 0429   12/26/14 0600  ceFAZolin (ANCEF) 3 g in dextrose 5 % 50 mL IVPB     3 g 160 mL/hr over 30 Minutes Intravenous On call to O.R. 12/25/14 1857 12/26/14 1035    .  Tolerated the procedure well.  Placed with a foley intraoperatively.  Given Ofirmev at induction and for 48 hours.    POD# 1: Vital signs were stable.  Patient denied Chest pain, shortness of breath, or calf pain.  Patient was started on Lovenox 30 mg subcutaneously twice  daily at 8am.  Consults to PT, OT, and care management were made.  The patient was weight bearing as tolerated.  CPM was placed on the operative leg 0-90 degrees for 6-8 hours a day.  Incentive spirometry was taught.  Dressing was changed.  Hemovac was discontinued.      POD #2, Continued  PT for ambulation and exercise program.  IV saline locked.  O2 discontinued.    The remainder of the hospital course was dedicated to ambulation and strengthening.   The patient was discharged on 2 Days Post-Op in  Good condition.  Blood products given:none  DIAGNOSTIC STUDIES: Recent vital signs: Patient Vitals for the past 24 hrs:  BP Temp Temp src Pulse Resp SpO2  12/28/14 1033 (!) 91/54 mmHg - - 68 - -  12/28/14 0805 (!) 90/58 mmHg - - 78 - -  12/28/14 0517 (!) 95/54 mmHg 98.7 F (37.1 C) Oral 73 16 93 %  12/27/14 2100 (!) 108/50 mmHg 98.5 F (36.9 C) Oral 78 14 91 %  12/27/14 1159 (!) 92/48 mmHg - - - - -       Recent laboratory studies:  Recent Labs  12/26/14 1645 12/27/14 0600 12/28/14 0526  WBC 9.2 9.0 9.8  HGB 14.6 13.3 12.8*  HCT 44.1  40.9 39.2  PLT 198 184 176    Recent Labs  12/26/14 1645 12/27/14 0600  NA  --  135  K  --  5.1  CL  --  99*  CO2  --  30  BUN  --  9  CREATININE 1.03 1.02  GLUCOSE  --  101*  CALCIUM  --  8.4*   Lab Results  Component Value Date   INR 1.10 12/16/2014     Recent Radiographic Studies :  Dg Chest 2 View  12/16/2014  CLINICAL DATA:  History of asthma.  Preoperative exam. EXAM: CHEST  2 VIEW COMPARISON:  10/01/2013. FINDINGS: Mediastinum and hilar structures are normal. Lungs are clear. Cardiomegaly with normal pulmonary vascularity. No pleural effusion or pneumothorax. Degenerative changes thoracic spine. IMPRESSION: No acute cardiopulmonary disease. Electronically Signed   By: Marcello Moores  Register   On: 12/16/2014 14:51    DISCHARGE INSTRUCTIONS: Discharge Instructions    CPM    Complete by:  As directed   Continuous passive motion machine (CPM):      Use the CPM from 0 to 90 for 6-8 hours per day.      You may increase by 10 per day.  You may break it up into 2 or 3 sessions per day.      Use CPM for 2 weeks or until you are told to stop.     Call MD / Call 911    Complete by:  As directed   If you experience chest pain or shortness of breath, CALL 911 and be transported to the hospital emergency room.  If you develope a fever above 101 F, pus (white drainage) or increased drainage or redness at the wound, or calf pain, call your surgeon's office.     Change dressing    Complete by:  As directed   Change dressing on Thursday, then change the dressing daily with sterile 4 x 4 inch gauze dressing and apply TED hose.     Constipation Prevention    Complete by:  As directed   Drink plenty of fluids.  Prune juice may be helpful.  You may use a stool softener, such as Colace (over the counter) 100 mg  twice a day.  Use MiraLax (over the counter) for constipation as needed.     Diet - low sodium heart healthy    Complete by:  As directed      Do not put a pillow under the knee. Place  it under the heel.    Complete by:  As directed      Driving restrictions    Complete by:  As directed   No driving for 6 weeks     Increase activity slowly as tolerated    Complete by:  As directed      Lifting restrictions    Complete by:  As directed   No lifting for 6 weeks     TED hose    Complete by:  As directed   Use stockings (TED hose) for 2 weeks on both leg(s).  You may remove them at night for sleeping.           DISCHARGE MEDICATIONS:     Medication List    TAKE these medications        albuterol (2.5 MG/3ML) 0.083% nebulizer solution  Commonly known as:  PROVENTIL  Take 2.5 mg by nebulization every 6 (six) hours as needed for wheezing or shortness of breath.     albuterol 108 (90 BASE) MCG/ACT inhaler  Commonly known as:  PROVENTIL HFA;VENTOLIN HFA  Inhale 2 puffs into the lungs every 6 (six) hours as needed for wheezing or shortness of breath.     ALPRAZolam 0.5 MG tablet  Commonly known as:  XANAX  Take 0.5 mg by mouth every 8 (eight) hours as needed for anxiety.     aspirin 325 MG tablet  Take 325 mg by mouth daily.     dicyclomine 10 MG capsule  Commonly known as:  BENTYL  Take 10 mg by mouth 4 (four) times daily as needed (cramping).     enoxaparin 40 MG/0.4ML injection  Commonly known as:  LOVENOX  Inject 0.4 mLs (40 mg total) into the skin daily.     esomeprazole 20 MG capsule  Commonly known as:  NEXIUM  Take 20 mg by mouth daily as needed.     HYDROcodone-acetaminophen 5-325 MG tablet  Commonly known as:  NORCO/VICODIN  Take 1 tablet by mouth every 6 (six) hours as needed for moderate pain.     latanoprost 0.005 % ophthalmic solution  Commonly known as:  XALATAN  Place 1 drop into both eyes at bedtime.     levothyroxine 200 MCG tablet  Commonly known as:  SYNTHROID, LEVOTHROID  Take 200 mcg by mouth daily before breakfast.     lisinopril-hydrochlorothiazide 20-12.5 MG tablet  Commonly known as:  PRINZIDE,ZESTORETIC  Take 1 tablet  by mouth daily.     loratadine 10 MG tablet  Commonly known as:  CLARITIN  Take 10 mg by mouth daily as needed for allergies.     methocarbamol 500 MG tablet  Commonly known as:  ROBAXIN  Take 1-2 tablets (500-1,000 mg total) by mouth every 6 (six) hours as needed for muscle spasms.     metoprolol succinate 50 MG 24 hr tablet  Commonly known as:  TOPROL-XL  Take 50 mg by mouth daily. Take with or immediately following a meal.     oxyCODONE 5 MG immediate release tablet  Commonly known as:  Oxy IR/ROXICODONE  Take 1-2 tablets (5-10 mg total) by mouth every 3 (three) hours as needed for breakthrough pain.     oxyCODONE 10 mg 12  hr tablet  Commonly known as:  OXYCONTIN  Take 1 tablet (10 mg total) by mouth every 12 (twelve) hours.     PRESCRIPTION MEDICATION  Take 3 tablets by mouth daily. Gout study [id number](administered by Dr. [facility] [phone]) started 03/22/14 - pt takes 3 tablets each morning 1 each from 3 different bottles: 1st and 2nd bottles Febuxostat, Allopurinol or Placebo; 3rd bottle colchicine 0.6 mg     triazolam 0.25 MG tablet  Commonly known as:  HALCION  Take 0.25 mg by mouth at bedtime as needed for sleep.        FOLLOW UP VISIT:       Follow-up Information    Follow up with Rudean Haskell, MD. Call on 01/10/2015.   Specialty:  Orthopedic Surgery   Contact information:   [place] [phone]       DISPOSITION: SNF  CONDITION:  Good   Glenn Beck 12/28/2014, 11:33 AM

## 2014-12-30 DIAGNOSIS — I119 Hypertensive heart disease without heart failure: Secondary | ICD-10-CM | POA: Diagnosis not present

## 2014-12-30 DIAGNOSIS — G8918 Other acute postprocedural pain: Secondary | ICD-10-CM | POA: Diagnosis not present

## 2014-12-30 DIAGNOSIS — Z471 Aftercare following joint replacement surgery: Secondary | ICD-10-CM | POA: Diagnosis not present

## 2014-12-30 DIAGNOSIS — R262 Difficulty in walking, not elsewhere classified: Secondary | ICD-10-CM | POA: Diagnosis not present

## 2015-01-10 DIAGNOSIS — Z96651 Presence of right artificial knee joint: Secondary | ICD-10-CM | POA: Diagnosis not present

## 2015-01-10 DIAGNOSIS — M25461 Effusion, right knee: Secondary | ICD-10-CM | POA: Diagnosis not present

## 2015-01-10 DIAGNOSIS — Z4789 Encounter for other orthopedic aftercare: Secondary | ICD-10-CM | POA: Diagnosis not present

## 2015-01-11 DIAGNOSIS — I10 Essential (primary) hypertension: Secondary | ICD-10-CM | POA: Diagnosis not present

## 2015-01-11 DIAGNOSIS — R262 Difficulty in walking, not elsewhere classified: Secondary | ICD-10-CM | POA: Diagnosis not present

## 2015-01-11 DIAGNOSIS — I252 Old myocardial infarction: Secondary | ICD-10-CM | POA: Diagnosis not present

## 2015-01-11 DIAGNOSIS — M1711 Unilateral primary osteoarthritis, right knee: Secondary | ICD-10-CM | POA: Diagnosis not present

## 2015-01-11 DIAGNOSIS — M25461 Effusion, right knee: Secondary | ICD-10-CM | POA: Diagnosis not present

## 2015-01-11 DIAGNOSIS — J45909 Unspecified asthma, uncomplicated: Secondary | ICD-10-CM | POA: Diagnosis not present

## 2015-01-11 DIAGNOSIS — Z96651 Presence of right artificial knee joint: Secondary | ICD-10-CM | POA: Diagnosis not present

## 2015-01-11 DIAGNOSIS — M25561 Pain in right knee: Secondary | ICD-10-CM | POA: Diagnosis not present

## 2015-01-13 DIAGNOSIS — M25461 Effusion, right knee: Secondary | ICD-10-CM | POA: Diagnosis not present

## 2015-01-13 DIAGNOSIS — R262 Difficulty in walking, not elsewhere classified: Secondary | ICD-10-CM | POA: Diagnosis not present

## 2015-01-13 DIAGNOSIS — M25561 Pain in right knee: Secondary | ICD-10-CM | POA: Diagnosis not present

## 2015-01-13 DIAGNOSIS — Z96651 Presence of right artificial knee joint: Secondary | ICD-10-CM | POA: Diagnosis not present

## 2015-01-17 DIAGNOSIS — M25561 Pain in right knee: Secondary | ICD-10-CM | POA: Diagnosis not present

## 2015-01-17 DIAGNOSIS — M25461 Effusion, right knee: Secondary | ICD-10-CM | POA: Diagnosis not present

## 2015-01-17 DIAGNOSIS — Z96651 Presence of right artificial knee joint: Secondary | ICD-10-CM | POA: Diagnosis not present

## 2015-01-17 DIAGNOSIS — R262 Difficulty in walking, not elsewhere classified: Secondary | ICD-10-CM | POA: Diagnosis not present

## 2015-01-18 DIAGNOSIS — R262 Difficulty in walking, not elsewhere classified: Secondary | ICD-10-CM | POA: Diagnosis not present

## 2015-01-18 DIAGNOSIS — M25461 Effusion, right knee: Secondary | ICD-10-CM | POA: Diagnosis not present

## 2015-01-18 DIAGNOSIS — Z96651 Presence of right artificial knee joint: Secondary | ICD-10-CM | POA: Diagnosis not present

## 2015-01-18 DIAGNOSIS — M25561 Pain in right knee: Secondary | ICD-10-CM | POA: Diagnosis not present

## 2015-01-20 DIAGNOSIS — Z96651 Presence of right artificial knee joint: Secondary | ICD-10-CM | POA: Diagnosis not present

## 2015-01-20 DIAGNOSIS — M25561 Pain in right knee: Secondary | ICD-10-CM | POA: Diagnosis not present

## 2015-01-20 DIAGNOSIS — R262 Difficulty in walking, not elsewhere classified: Secondary | ICD-10-CM | POA: Diagnosis not present

## 2015-01-20 DIAGNOSIS — M25461 Effusion, right knee: Secondary | ICD-10-CM | POA: Diagnosis not present

## 2015-01-25 DIAGNOSIS — M25561 Pain in right knee: Secondary | ICD-10-CM | POA: Diagnosis not present

## 2015-01-25 DIAGNOSIS — Z96651 Presence of right artificial knee joint: Secondary | ICD-10-CM | POA: Diagnosis not present

## 2015-01-25 DIAGNOSIS — L988 Other specified disorders of the skin and subcutaneous tissue: Secondary | ICD-10-CM | POA: Diagnosis not present

## 2015-01-25 DIAGNOSIS — R262 Difficulty in walking, not elsewhere classified: Secondary | ICD-10-CM | POA: Diagnosis not present

## 2015-01-25 DIAGNOSIS — M25461 Effusion, right knee: Secondary | ICD-10-CM | POA: Diagnosis not present

## 2015-01-25 DIAGNOSIS — R229 Localized swelling, mass and lump, unspecified: Secondary | ICD-10-CM | POA: Diagnosis not present

## 2015-01-25 DIAGNOSIS — I2510 Atherosclerotic heart disease of native coronary artery without angina pectoris: Secondary | ICD-10-CM | POA: Diagnosis not present

## 2015-01-25 DIAGNOSIS — Z139 Encounter for screening, unspecified: Secondary | ICD-10-CM | POA: Diagnosis not present

## 2015-01-25 DIAGNOSIS — E039 Hypothyroidism, unspecified: Secondary | ICD-10-CM | POA: Diagnosis not present

## 2015-01-25 DIAGNOSIS — M159 Polyosteoarthritis, unspecified: Secondary | ICD-10-CM | POA: Diagnosis not present

## 2015-01-27 DIAGNOSIS — M25461 Effusion, right knee: Secondary | ICD-10-CM | POA: Diagnosis not present

## 2015-01-27 DIAGNOSIS — M25561 Pain in right knee: Secondary | ICD-10-CM | POA: Diagnosis not present

## 2015-01-27 DIAGNOSIS — Z96651 Presence of right artificial knee joint: Secondary | ICD-10-CM | POA: Diagnosis not present

## 2015-01-27 DIAGNOSIS — R262 Difficulty in walking, not elsewhere classified: Secondary | ICD-10-CM | POA: Diagnosis not present

## 2015-01-31 DIAGNOSIS — Z96651 Presence of right artificial knee joint: Secondary | ICD-10-CM | POA: Diagnosis not present

## 2015-01-31 DIAGNOSIS — R262 Difficulty in walking, not elsewhere classified: Secondary | ICD-10-CM | POA: Diagnosis not present

## 2015-01-31 DIAGNOSIS — M25561 Pain in right knee: Secondary | ICD-10-CM | POA: Diagnosis not present

## 2015-01-31 DIAGNOSIS — M25461 Effusion, right knee: Secondary | ICD-10-CM | POA: Diagnosis not present

## 2015-02-03 DIAGNOSIS — M25561 Pain in right knee: Secondary | ICD-10-CM | POA: Diagnosis not present

## 2015-02-03 DIAGNOSIS — M25461 Effusion, right knee: Secondary | ICD-10-CM | POA: Diagnosis not present

## 2015-02-03 DIAGNOSIS — Z96651 Presence of right artificial knee joint: Secondary | ICD-10-CM | POA: Diagnosis not present

## 2015-02-03 DIAGNOSIS — R262 Difficulty in walking, not elsewhere classified: Secondary | ICD-10-CM | POA: Diagnosis not present

## 2015-02-07 DIAGNOSIS — Z96651 Presence of right artificial knee joint: Secondary | ICD-10-CM | POA: Diagnosis not present

## 2015-02-07 DIAGNOSIS — M25461 Effusion, right knee: Secondary | ICD-10-CM | POA: Diagnosis not present

## 2015-02-07 DIAGNOSIS — M25561 Pain in right knee: Secondary | ICD-10-CM | POA: Diagnosis not present

## 2015-02-07 DIAGNOSIS — R262 Difficulty in walking, not elsewhere classified: Secondary | ICD-10-CM | POA: Diagnosis not present

## 2015-02-10 DIAGNOSIS — M25461 Effusion, right knee: Secondary | ICD-10-CM | POA: Diagnosis not present

## 2015-02-10 DIAGNOSIS — R262 Difficulty in walking, not elsewhere classified: Secondary | ICD-10-CM | POA: Diagnosis not present

## 2015-02-10 DIAGNOSIS — Z96651 Presence of right artificial knee joint: Secondary | ICD-10-CM | POA: Diagnosis not present

## 2015-02-10 DIAGNOSIS — M25561 Pain in right knee: Secondary | ICD-10-CM | POA: Diagnosis not present

## 2015-02-14 DIAGNOSIS — Z96651 Presence of right artificial knee joint: Secondary | ICD-10-CM | POA: Diagnosis not present

## 2015-02-14 DIAGNOSIS — M25461 Effusion, right knee: Secondary | ICD-10-CM | POA: Diagnosis not present

## 2015-02-14 DIAGNOSIS — R262 Difficulty in walking, not elsewhere classified: Secondary | ICD-10-CM | POA: Diagnosis not present

## 2015-02-14 DIAGNOSIS — M25561 Pain in right knee: Secondary | ICD-10-CM | POA: Diagnosis not present

## 2015-02-17 DIAGNOSIS — M25561 Pain in right knee: Secondary | ICD-10-CM | POA: Diagnosis not present

## 2015-02-17 DIAGNOSIS — M25461 Effusion, right knee: Secondary | ICD-10-CM | POA: Diagnosis not present

## 2015-02-17 DIAGNOSIS — R262 Difficulty in walking, not elsewhere classified: Secondary | ICD-10-CM | POA: Diagnosis not present

## 2015-02-17 DIAGNOSIS — Z96651 Presence of right artificial knee joint: Secondary | ICD-10-CM | POA: Diagnosis not present

## 2015-02-21 DIAGNOSIS — M25461 Effusion, right knee: Secondary | ICD-10-CM | POA: Diagnosis not present

## 2015-02-21 DIAGNOSIS — M25561 Pain in right knee: Secondary | ICD-10-CM | POA: Diagnosis not present

## 2015-02-21 DIAGNOSIS — Z96651 Presence of right artificial knee joint: Secondary | ICD-10-CM | POA: Diagnosis not present

## 2015-02-21 DIAGNOSIS — R262 Difficulty in walking, not elsewhere classified: Secondary | ICD-10-CM | POA: Diagnosis not present

## 2015-02-24 DIAGNOSIS — M25461 Effusion, right knee: Secondary | ICD-10-CM | POA: Diagnosis not present

## 2015-02-24 DIAGNOSIS — Z96651 Presence of right artificial knee joint: Secondary | ICD-10-CM | POA: Diagnosis not present

## 2015-02-24 DIAGNOSIS — R262 Difficulty in walking, not elsewhere classified: Secondary | ICD-10-CM | POA: Diagnosis not present

## 2015-02-24 DIAGNOSIS — M25561 Pain in right knee: Secondary | ICD-10-CM | POA: Diagnosis not present

## 2015-02-28 DIAGNOSIS — R262 Difficulty in walking, not elsewhere classified: Secondary | ICD-10-CM | POA: Diagnosis not present

## 2015-02-28 DIAGNOSIS — M25461 Effusion, right knee: Secondary | ICD-10-CM | POA: Diagnosis not present

## 2015-02-28 DIAGNOSIS — Z96651 Presence of right artificial knee joint: Secondary | ICD-10-CM | POA: Diagnosis not present

## 2015-02-28 DIAGNOSIS — M25561 Pain in right knee: Secondary | ICD-10-CM | POA: Diagnosis not present

## 2015-03-03 DIAGNOSIS — Z96651 Presence of right artificial knee joint: Secondary | ICD-10-CM | POA: Diagnosis not present

## 2015-03-03 DIAGNOSIS — R262 Difficulty in walking, not elsewhere classified: Secondary | ICD-10-CM | POA: Diagnosis not present

## 2015-03-03 DIAGNOSIS — M25461 Effusion, right knee: Secondary | ICD-10-CM | POA: Diagnosis not present

## 2015-03-03 DIAGNOSIS — M25561 Pain in right knee: Secondary | ICD-10-CM | POA: Diagnosis not present

## 2015-03-06 DIAGNOSIS — M25561 Pain in right knee: Secondary | ICD-10-CM | POA: Diagnosis not present

## 2015-03-06 DIAGNOSIS — M25461 Effusion, right knee: Secondary | ICD-10-CM | POA: Diagnosis not present

## 2015-03-06 DIAGNOSIS — R262 Difficulty in walking, not elsewhere classified: Secondary | ICD-10-CM | POA: Diagnosis not present

## 2015-03-06 DIAGNOSIS — Z96651 Presence of right artificial knee joint: Secondary | ICD-10-CM | POA: Diagnosis not present

## 2015-03-08 DIAGNOSIS — Z96651 Presence of right artificial knee joint: Secondary | ICD-10-CM | POA: Diagnosis not present

## 2015-03-08 DIAGNOSIS — M25561 Pain in right knee: Secondary | ICD-10-CM | POA: Diagnosis not present

## 2015-03-08 DIAGNOSIS — M25461 Effusion, right knee: Secondary | ICD-10-CM | POA: Diagnosis not present

## 2015-03-08 DIAGNOSIS — R262 Difficulty in walking, not elsewhere classified: Secondary | ICD-10-CM | POA: Diagnosis not present

## 2015-03-10 DIAGNOSIS — Z96651 Presence of right artificial knee joint: Secondary | ICD-10-CM | POA: Diagnosis not present

## 2015-03-10 DIAGNOSIS — M25561 Pain in right knee: Secondary | ICD-10-CM | POA: Diagnosis not present

## 2015-03-10 DIAGNOSIS — R262 Difficulty in walking, not elsewhere classified: Secondary | ICD-10-CM | POA: Diagnosis not present

## 2015-03-10 DIAGNOSIS — M25461 Effusion, right knee: Secondary | ICD-10-CM | POA: Diagnosis not present

## 2015-03-13 DIAGNOSIS — M25561 Pain in right knee: Secondary | ICD-10-CM | POA: Diagnosis not present

## 2015-03-13 DIAGNOSIS — M25461 Effusion, right knee: Secondary | ICD-10-CM | POA: Diagnosis not present

## 2015-03-13 DIAGNOSIS — R262 Difficulty in walking, not elsewhere classified: Secondary | ICD-10-CM | POA: Diagnosis not present

## 2015-03-13 DIAGNOSIS — Z96651 Presence of right artificial knee joint: Secondary | ICD-10-CM | POA: Diagnosis not present

## 2015-03-16 DIAGNOSIS — M25461 Effusion, right knee: Secondary | ICD-10-CM | POA: Diagnosis not present

## 2015-03-16 DIAGNOSIS — R262 Difficulty in walking, not elsewhere classified: Secondary | ICD-10-CM | POA: Diagnosis not present

## 2015-03-16 DIAGNOSIS — Z96651 Presence of right artificial knee joint: Secondary | ICD-10-CM | POA: Diagnosis not present

## 2015-03-16 DIAGNOSIS — M25561 Pain in right knee: Secondary | ICD-10-CM | POA: Diagnosis not present

## 2015-03-17 DIAGNOSIS — M25561 Pain in right knee: Secondary | ICD-10-CM | POA: Diagnosis not present

## 2015-03-17 DIAGNOSIS — Z96651 Presence of right artificial knee joint: Secondary | ICD-10-CM | POA: Diagnosis not present

## 2015-03-17 DIAGNOSIS — R262 Difficulty in walking, not elsewhere classified: Secondary | ICD-10-CM | POA: Diagnosis not present

## 2015-03-17 DIAGNOSIS — M25461 Effusion, right knee: Secondary | ICD-10-CM | POA: Diagnosis not present

## 2015-03-20 DIAGNOSIS — I10 Essential (primary) hypertension: Secondary | ICD-10-CM | POA: Diagnosis not present

## 2015-03-20 DIAGNOSIS — M159 Polyosteoarthritis, unspecified: Secondary | ICD-10-CM | POA: Diagnosis not present

## 2015-03-20 DIAGNOSIS — E039 Hypothyroidism, unspecified: Secondary | ICD-10-CM | POA: Diagnosis not present

## 2015-03-20 DIAGNOSIS — E785 Hyperlipidemia, unspecified: Secondary | ICD-10-CM | POA: Diagnosis not present

## 2015-03-20 DIAGNOSIS — I2510 Atherosclerotic heart disease of native coronary artery without angina pectoris: Secondary | ICD-10-CM | POA: Diagnosis not present

## 2015-03-20 DIAGNOSIS — N390 Urinary tract infection, site not specified: Secondary | ICD-10-CM | POA: Diagnosis not present

## 2015-03-21 DIAGNOSIS — M25461 Effusion, right knee: Secondary | ICD-10-CM | POA: Diagnosis not present

## 2015-03-21 DIAGNOSIS — R262 Difficulty in walking, not elsewhere classified: Secondary | ICD-10-CM | POA: Diagnosis not present

## 2015-03-21 DIAGNOSIS — Z96651 Presence of right artificial knee joint: Secondary | ICD-10-CM | POA: Diagnosis not present

## 2015-03-21 DIAGNOSIS — M25561 Pain in right knee: Secondary | ICD-10-CM | POA: Diagnosis not present

## 2015-03-24 DIAGNOSIS — M25461 Effusion, right knee: Secondary | ICD-10-CM | POA: Diagnosis not present

## 2015-03-24 DIAGNOSIS — M25561 Pain in right knee: Secondary | ICD-10-CM | POA: Diagnosis not present

## 2015-03-24 DIAGNOSIS — Z96651 Presence of right artificial knee joint: Secondary | ICD-10-CM | POA: Diagnosis not present

## 2015-03-24 DIAGNOSIS — R262 Difficulty in walking, not elsewhere classified: Secondary | ICD-10-CM | POA: Diagnosis not present

## 2015-03-27 DIAGNOSIS — Z96651 Presence of right artificial knee joint: Secondary | ICD-10-CM | POA: Diagnosis not present

## 2015-03-27 DIAGNOSIS — M25561 Pain in right knee: Secondary | ICD-10-CM | POA: Diagnosis not present

## 2015-03-27 DIAGNOSIS — R262 Difficulty in walking, not elsewhere classified: Secondary | ICD-10-CM | POA: Diagnosis not present

## 2015-03-27 DIAGNOSIS — M25461 Effusion, right knee: Secondary | ICD-10-CM | POA: Diagnosis not present

## 2015-03-29 DIAGNOSIS — R262 Difficulty in walking, not elsewhere classified: Secondary | ICD-10-CM | POA: Diagnosis not present

## 2015-03-29 DIAGNOSIS — M25561 Pain in right knee: Secondary | ICD-10-CM | POA: Diagnosis not present

## 2015-03-29 DIAGNOSIS — Z96651 Presence of right artificial knee joint: Secondary | ICD-10-CM | POA: Diagnosis not present

## 2015-03-29 DIAGNOSIS — M25461 Effusion, right knee: Secondary | ICD-10-CM | POA: Diagnosis not present

## 2015-03-31 DIAGNOSIS — M25561 Pain in right knee: Secondary | ICD-10-CM | POA: Diagnosis not present

## 2015-03-31 DIAGNOSIS — Z96651 Presence of right artificial knee joint: Secondary | ICD-10-CM | POA: Diagnosis not present

## 2015-03-31 DIAGNOSIS — R262 Difficulty in walking, not elsewhere classified: Secondary | ICD-10-CM | POA: Diagnosis not present

## 2015-03-31 DIAGNOSIS — M25461 Effusion, right knee: Secondary | ICD-10-CM | POA: Diagnosis not present

## 2015-04-04 DIAGNOSIS — Z96651 Presence of right artificial knee joint: Secondary | ICD-10-CM | POA: Diagnosis not present

## 2015-04-04 DIAGNOSIS — M25561 Pain in right knee: Secondary | ICD-10-CM | POA: Diagnosis not present

## 2015-04-04 DIAGNOSIS — M25461 Effusion, right knee: Secondary | ICD-10-CM | POA: Diagnosis not present

## 2015-04-04 DIAGNOSIS — R262 Difficulty in walking, not elsewhere classified: Secondary | ICD-10-CM | POA: Diagnosis not present

## 2015-04-07 DIAGNOSIS — R262 Difficulty in walking, not elsewhere classified: Secondary | ICD-10-CM | POA: Diagnosis not present

## 2015-04-07 DIAGNOSIS — M25461 Effusion, right knee: Secondary | ICD-10-CM | POA: Diagnosis not present

## 2015-04-07 DIAGNOSIS — Z96651 Presence of right artificial knee joint: Secondary | ICD-10-CM | POA: Diagnosis not present

## 2015-04-07 DIAGNOSIS — M25561 Pain in right knee: Secondary | ICD-10-CM | POA: Diagnosis not present

## 2015-04-10 DIAGNOSIS — Z96651 Presence of right artificial knee joint: Secondary | ICD-10-CM | POA: Diagnosis not present

## 2015-04-10 DIAGNOSIS — R262 Difficulty in walking, not elsewhere classified: Secondary | ICD-10-CM | POA: Diagnosis not present

## 2015-04-10 DIAGNOSIS — M25461 Effusion, right knee: Secondary | ICD-10-CM | POA: Diagnosis not present

## 2015-04-10 DIAGNOSIS — M25561 Pain in right knee: Secondary | ICD-10-CM | POA: Diagnosis not present

## 2015-04-14 DIAGNOSIS — Z96651 Presence of right artificial knee joint: Secondary | ICD-10-CM | POA: Diagnosis not present

## 2015-04-14 DIAGNOSIS — M25461 Effusion, right knee: Secondary | ICD-10-CM | POA: Diagnosis not present

## 2015-04-14 DIAGNOSIS — R262 Difficulty in walking, not elsewhere classified: Secondary | ICD-10-CM | POA: Diagnosis not present

## 2015-04-14 DIAGNOSIS — M25561 Pain in right knee: Secondary | ICD-10-CM | POA: Diagnosis not present

## 2015-04-19 DIAGNOSIS — I2510 Atherosclerotic heart disease of native coronary artery without angina pectoris: Secondary | ICD-10-CM | POA: Diagnosis not present

## 2015-04-19 DIAGNOSIS — M159 Polyosteoarthritis, unspecified: Secondary | ICD-10-CM | POA: Diagnosis not present

## 2015-04-19 DIAGNOSIS — E039 Hypothyroidism, unspecified: Secondary | ICD-10-CM | POA: Diagnosis not present

## 2015-04-19 DIAGNOSIS — L02419 Cutaneous abscess of limb, unspecified: Secondary | ICD-10-CM | POA: Diagnosis not present

## 2015-04-19 DIAGNOSIS — E785 Hyperlipidemia, unspecified: Secondary | ICD-10-CM | POA: Diagnosis not present

## 2015-04-25 DIAGNOSIS — Z96651 Presence of right artificial knee joint: Secondary | ICD-10-CM | POA: Diagnosis not present

## 2015-04-25 DIAGNOSIS — M25561 Pain in right knee: Secondary | ICD-10-CM | POA: Diagnosis not present

## 2015-04-25 DIAGNOSIS — M25461 Effusion, right knee: Secondary | ICD-10-CM | POA: Diagnosis not present

## 2015-04-25 DIAGNOSIS — R262 Difficulty in walking, not elsewhere classified: Secondary | ICD-10-CM | POA: Diagnosis not present

## 2015-05-03 DIAGNOSIS — I2510 Atherosclerotic heart disease of native coronary artery without angina pectoris: Secondary | ICD-10-CM | POA: Diagnosis not present

## 2015-05-03 DIAGNOSIS — E785 Hyperlipidemia, unspecified: Secondary | ICD-10-CM | POA: Diagnosis not present

## 2015-05-03 DIAGNOSIS — L02419 Cutaneous abscess of limb, unspecified: Secondary | ICD-10-CM | POA: Diagnosis not present

## 2015-05-03 DIAGNOSIS — M159 Polyosteoarthritis, unspecified: Secondary | ICD-10-CM | POA: Diagnosis not present

## 2015-05-03 DIAGNOSIS — E039 Hypothyroidism, unspecified: Secondary | ICD-10-CM | POA: Diagnosis not present

## 2015-05-17 DIAGNOSIS — E785 Hyperlipidemia, unspecified: Secondary | ICD-10-CM | POA: Diagnosis not present

## 2015-05-17 DIAGNOSIS — K210 Gastro-esophageal reflux disease with esophagitis: Secondary | ICD-10-CM | POA: Diagnosis not present

## 2015-05-17 DIAGNOSIS — E039 Hypothyroidism, unspecified: Secondary | ICD-10-CM | POA: Diagnosis not present

## 2015-05-17 DIAGNOSIS — M159 Polyosteoarthritis, unspecified: Secondary | ICD-10-CM | POA: Diagnosis not present

## 2015-05-17 DIAGNOSIS — I2510 Atherosclerotic heart disease of native coronary artery without angina pectoris: Secondary | ICD-10-CM | POA: Diagnosis not present

## 2015-06-19 DIAGNOSIS — Z96651 Presence of right artificial knee joint: Secondary | ICD-10-CM | POA: Diagnosis not present

## 2015-06-19 DIAGNOSIS — Z471 Aftercare following joint replacement surgery: Secondary | ICD-10-CM | POA: Diagnosis not present

## 2015-07-03 DIAGNOSIS — E785 Hyperlipidemia, unspecified: Secondary | ICD-10-CM | POA: Diagnosis not present

## 2015-07-03 DIAGNOSIS — E039 Hypothyroidism, unspecified: Secondary | ICD-10-CM | POA: Diagnosis not present

## 2015-07-03 DIAGNOSIS — Z9181 History of falling: Secondary | ICD-10-CM | POA: Diagnosis not present

## 2015-07-03 DIAGNOSIS — I10 Essential (primary) hypertension: Secondary | ICD-10-CM | POA: Diagnosis not present

## 2015-07-11 DIAGNOSIS — H524 Presbyopia: Secondary | ICD-10-CM | POA: Diagnosis not present

## 2015-07-11 DIAGNOSIS — H2513 Age-related nuclear cataract, bilateral: Secondary | ICD-10-CM | POA: Diagnosis not present

## 2015-07-11 DIAGNOSIS — H401131 Primary open-angle glaucoma, bilateral, mild stage: Secondary | ICD-10-CM | POA: Diagnosis not present

## 2015-07-31 DIAGNOSIS — R1310 Dysphagia, unspecified: Secondary | ICD-10-CM | POA: Diagnosis not present

## 2015-08-01 DIAGNOSIS — E039 Hypothyroidism, unspecified: Secondary | ICD-10-CM | POA: Diagnosis not present

## 2015-08-01 DIAGNOSIS — M545 Low back pain: Secondary | ICD-10-CM | POA: Diagnosis not present

## 2015-08-01 DIAGNOSIS — E785 Hyperlipidemia, unspecified: Secondary | ICD-10-CM | POA: Diagnosis not present

## 2015-08-01 DIAGNOSIS — I2510 Atherosclerotic heart disease of native coronary artery without angina pectoris: Secondary | ICD-10-CM | POA: Diagnosis not present

## 2015-08-01 DIAGNOSIS — I10 Essential (primary) hypertension: Secondary | ICD-10-CM | POA: Diagnosis not present

## 2015-08-08 DIAGNOSIS — E039 Hypothyroidism, unspecified: Secondary | ICD-10-CM | POA: Diagnosis not present

## 2015-08-08 DIAGNOSIS — E785 Hyperlipidemia, unspecified: Secondary | ICD-10-CM | POA: Diagnosis not present

## 2015-08-08 DIAGNOSIS — M545 Low back pain: Secondary | ICD-10-CM | POA: Diagnosis not present

## 2015-08-08 DIAGNOSIS — I10 Essential (primary) hypertension: Secondary | ICD-10-CM | POA: Diagnosis not present

## 2015-08-08 DIAGNOSIS — I2510 Atherosclerotic heart disease of native coronary artery without angina pectoris: Secondary | ICD-10-CM | POA: Diagnosis not present

## 2015-08-17 DIAGNOSIS — I10 Essential (primary) hypertension: Secondary | ICD-10-CM | POA: Diagnosis not present

## 2015-08-17 DIAGNOSIS — K222 Esophageal obstruction: Secondary | ICD-10-CM | POA: Diagnosis not present

## 2015-08-17 DIAGNOSIS — Z86010 Personal history of colonic polyps: Secondary | ICD-10-CM | POA: Diagnosis not present

## 2015-08-17 DIAGNOSIS — R1310 Dysphagia, unspecified: Secondary | ICD-10-CM | POA: Diagnosis not present

## 2015-08-17 DIAGNOSIS — Z96651 Presence of right artificial knee joint: Secondary | ICD-10-CM | POA: Diagnosis not present

## 2015-08-17 DIAGNOSIS — I252 Old myocardial infarction: Secondary | ICD-10-CM | POA: Diagnosis not present

## 2015-08-17 DIAGNOSIS — Z79899 Other long term (current) drug therapy: Secondary | ICD-10-CM | POA: Diagnosis not present

## 2015-08-17 DIAGNOSIS — J45909 Unspecified asthma, uncomplicated: Secondary | ICD-10-CM | POA: Diagnosis not present

## 2015-08-17 DIAGNOSIS — K208 Other esophagitis: Secondary | ICD-10-CM | POA: Diagnosis not present

## 2015-08-17 DIAGNOSIS — Z7982 Long term (current) use of aspirin: Secondary | ICD-10-CM | POA: Diagnosis not present

## 2015-09-05 DIAGNOSIS — M159 Polyosteoarthritis, unspecified: Secondary | ICD-10-CM | POA: Diagnosis not present

## 2015-09-05 DIAGNOSIS — E039 Hypothyroidism, unspecified: Secondary | ICD-10-CM | POA: Diagnosis not present

## 2015-09-05 DIAGNOSIS — I2510 Atherosclerotic heart disease of native coronary artery without angina pectoris: Secondary | ICD-10-CM | POA: Diagnosis not present

## 2015-09-05 DIAGNOSIS — E785 Hyperlipidemia, unspecified: Secondary | ICD-10-CM | POA: Diagnosis not present

## 2015-09-05 DIAGNOSIS — M545 Low back pain: Secondary | ICD-10-CM | POA: Diagnosis not present

## 2015-09-21 DIAGNOSIS — R1310 Dysphagia, unspecified: Secondary | ICD-10-CM | POA: Diagnosis not present

## 2015-09-23 IMAGING — RF DG C-ARM 61-120 MIN
1 series · 2 of 2 positions shown · non-contrast
Comparison: None.

CLINICAL DATA: Cervical fusion

EXAM:
CERVICAL SPINE - 2-3 VIEW; DG C-ARM 61-120 MIN

[Series 1: run · 2 of 2 slices shown]
[im 1/2]
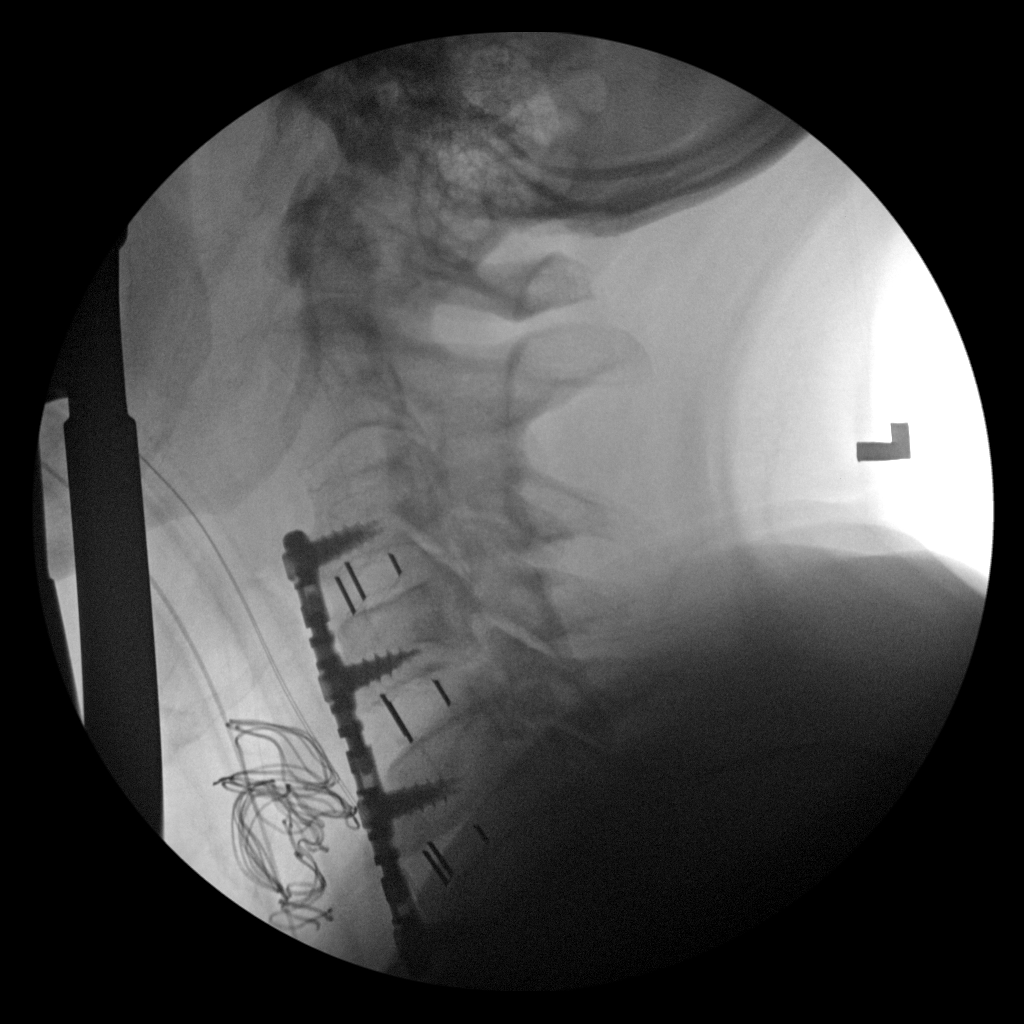
[im 2/2]
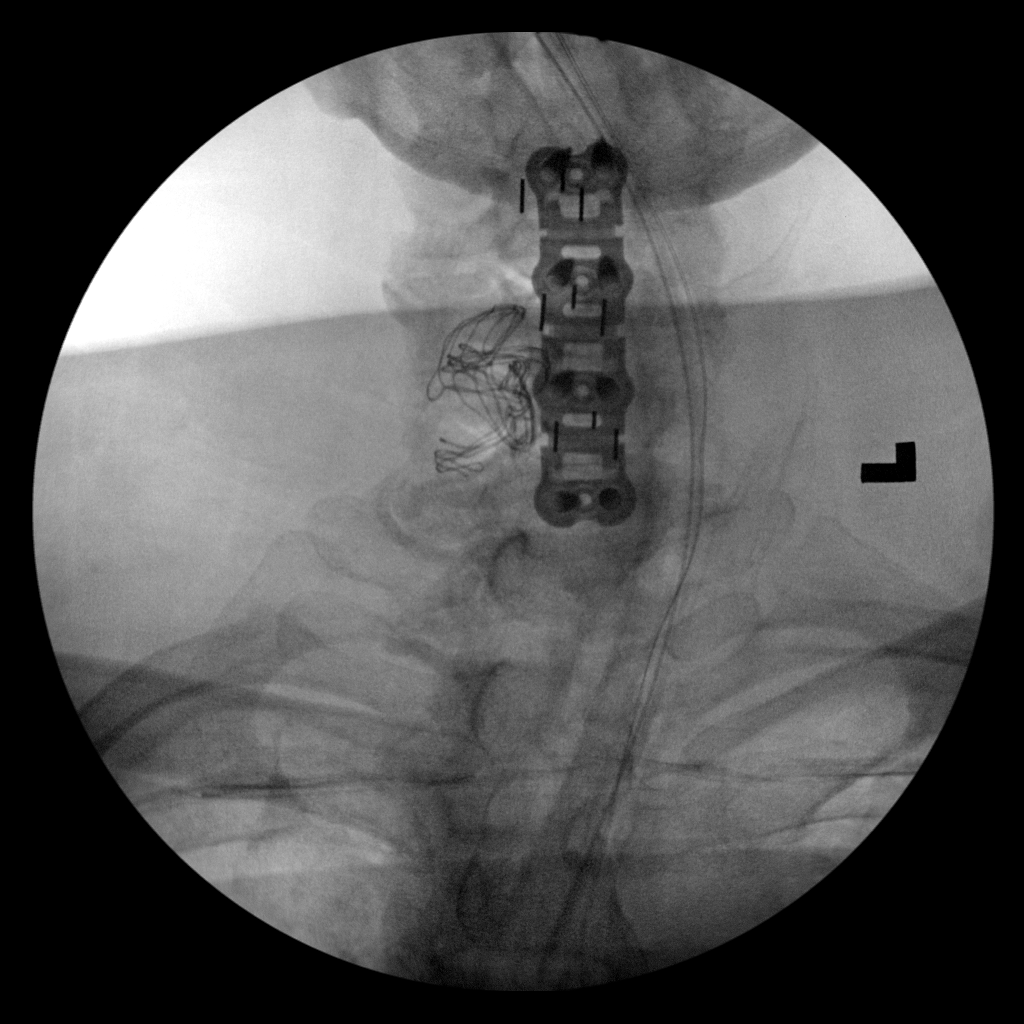

[2 of 2 positions shown; findings below may reference images not displayed]

FINDINGS: Two spot films were obtained intraoperatively and reveal anterior
cervical fusion from C3-C6. 12 seconds of fluoroscopy was utilized
during this exam. No acute abnormality is noted.
IMPRESSION: Cervical fusion from C3-C6.

## 2015-09-25 DIAGNOSIS — R1310 Dysphagia, unspecified: Secondary | ICD-10-CM | POA: Diagnosis not present

## 2015-09-25 DIAGNOSIS — K219 Gastro-esophageal reflux disease without esophagitis: Secondary | ICD-10-CM | POA: Diagnosis not present

## 2015-10-03 DIAGNOSIS — I10 Essential (primary) hypertension: Secondary | ICD-10-CM | POA: Diagnosis not present

## 2015-10-03 DIAGNOSIS — E785 Hyperlipidemia, unspecified: Secondary | ICD-10-CM | POA: Diagnosis not present

## 2015-10-03 DIAGNOSIS — M545 Low back pain: Secondary | ICD-10-CM | POA: Diagnosis not present

## 2015-10-03 DIAGNOSIS — E039 Hypothyroidism, unspecified: Secondary | ICD-10-CM | POA: Diagnosis not present

## 2015-10-03 DIAGNOSIS — I2510 Atherosclerotic heart disease of native coronary artery without angina pectoris: Secondary | ICD-10-CM | POA: Diagnosis not present

## 2015-10-10 DIAGNOSIS — H401131 Primary open-angle glaucoma, bilateral, mild stage: Secondary | ICD-10-CM | POA: Diagnosis not present

## 2015-10-30 DIAGNOSIS — I10 Essential (primary) hypertension: Secondary | ICD-10-CM | POA: Diagnosis not present

## 2015-10-30 DIAGNOSIS — I2510 Atherosclerotic heart disease of native coronary artery without angina pectoris: Secondary | ICD-10-CM | POA: Diagnosis not present

## 2015-10-30 DIAGNOSIS — E039 Hypothyroidism, unspecified: Secondary | ICD-10-CM | POA: Diagnosis not present

## 2015-10-30 DIAGNOSIS — E785 Hyperlipidemia, unspecified: Secondary | ICD-10-CM | POA: Diagnosis not present

## 2015-10-30 DIAGNOSIS — J0101 Acute recurrent maxillary sinusitis: Secondary | ICD-10-CM | POA: Diagnosis not present

## 2015-12-11 DIAGNOSIS — I2510 Atherosclerotic heart disease of native coronary artery without angina pectoris: Secondary | ICD-10-CM | POA: Diagnosis not present

## 2015-12-11 DIAGNOSIS — E039 Hypothyroidism, unspecified: Secondary | ICD-10-CM | POA: Diagnosis not present

## 2015-12-11 DIAGNOSIS — Z1389 Encounter for screening for other disorder: Secondary | ICD-10-CM | POA: Diagnosis not present

## 2015-12-11 DIAGNOSIS — E785 Hyperlipidemia, unspecified: Secondary | ICD-10-CM | POA: Diagnosis not present

## 2015-12-11 DIAGNOSIS — I10 Essential (primary) hypertension: Secondary | ICD-10-CM | POA: Diagnosis not present

## 2016-01-17 DIAGNOSIS — H401131 Primary open-angle glaucoma, bilateral, mild stage: Secondary | ICD-10-CM | POA: Diagnosis not present

## 2016-01-19 DIAGNOSIS — M545 Low back pain: Secondary | ICD-10-CM | POA: Diagnosis not present

## 2016-01-19 DIAGNOSIS — M159 Polyosteoarthritis, unspecified: Secondary | ICD-10-CM | POA: Diagnosis not present

## 2016-01-19 DIAGNOSIS — I10 Essential (primary) hypertension: Secondary | ICD-10-CM | POA: Diagnosis not present

## 2016-01-19 DIAGNOSIS — E039 Hypothyroidism, unspecified: Secondary | ICD-10-CM | POA: Diagnosis not present

## 2016-01-19 DIAGNOSIS — E785 Hyperlipidemia, unspecified: Secondary | ICD-10-CM | POA: Diagnosis not present

## 2016-01-19 DIAGNOSIS — I2510 Atherosclerotic heart disease of native coronary artery without angina pectoris: Secondary | ICD-10-CM | POA: Diagnosis not present

## 2016-01-27 DIAGNOSIS — M545 Low back pain: Secondary | ICD-10-CM | POA: Diagnosis not present

## 2016-01-27 DIAGNOSIS — I10 Essential (primary) hypertension: Secondary | ICD-10-CM | POA: Diagnosis not present

## 2016-01-27 DIAGNOSIS — I2510 Atherosclerotic heart disease of native coronary artery without angina pectoris: Secondary | ICD-10-CM | POA: Diagnosis not present

## 2016-01-27 DIAGNOSIS — M109 Gout, unspecified: Secondary | ICD-10-CM | POA: Diagnosis not present

## 2016-01-27 DIAGNOSIS — E039 Hypothyroidism, unspecified: Secondary | ICD-10-CM | POA: Diagnosis not present

## 2016-01-29 DIAGNOSIS — M545 Low back pain: Secondary | ICD-10-CM | POA: Diagnosis not present

## 2016-01-29 DIAGNOSIS — E039 Hypothyroidism, unspecified: Secondary | ICD-10-CM | POA: Diagnosis not present

## 2016-01-29 DIAGNOSIS — M109 Gout, unspecified: Secondary | ICD-10-CM | POA: Diagnosis not present

## 2016-01-29 DIAGNOSIS — I2510 Atherosclerotic heart disease of native coronary artery without angina pectoris: Secondary | ICD-10-CM | POA: Diagnosis not present

## 2016-01-29 DIAGNOSIS — I10 Essential (primary) hypertension: Secondary | ICD-10-CM | POA: Diagnosis not present

## 2016-02-05 DIAGNOSIS — I2510 Atherosclerotic heart disease of native coronary artery without angina pectoris: Secondary | ICD-10-CM | POA: Diagnosis not present

## 2016-02-05 DIAGNOSIS — E039 Hypothyroidism, unspecified: Secondary | ICD-10-CM | POA: Diagnosis not present

## 2016-02-05 DIAGNOSIS — Z79891 Long term (current) use of opiate analgesic: Secondary | ICD-10-CM | POA: Diagnosis not present

## 2016-02-05 DIAGNOSIS — L309 Dermatitis, unspecified: Secondary | ICD-10-CM | POA: Diagnosis not present

## 2016-02-05 DIAGNOSIS — I10 Essential (primary) hypertension: Secondary | ICD-10-CM | POA: Diagnosis not present

## 2016-03-06 DIAGNOSIS — M545 Low back pain: Secondary | ICD-10-CM | POA: Diagnosis not present

## 2016-03-06 DIAGNOSIS — M159 Polyosteoarthritis, unspecified: Secondary | ICD-10-CM | POA: Diagnosis not present

## 2016-03-06 DIAGNOSIS — I2510 Atherosclerotic heart disease of native coronary artery without angina pectoris: Secondary | ICD-10-CM | POA: Diagnosis not present

## 2016-03-06 DIAGNOSIS — E039 Hypothyroidism, unspecified: Secondary | ICD-10-CM | POA: Diagnosis not present

## 2016-03-06 DIAGNOSIS — I10 Essential (primary) hypertension: Secondary | ICD-10-CM | POA: Diagnosis not present

## 2016-04-03 DIAGNOSIS — M159 Polyosteoarthritis, unspecified: Secondary | ICD-10-CM | POA: Diagnosis not present

## 2016-04-03 DIAGNOSIS — E039 Hypothyroidism, unspecified: Secondary | ICD-10-CM | POA: Diagnosis not present

## 2016-04-03 DIAGNOSIS — M545 Low back pain: Secondary | ICD-10-CM | POA: Diagnosis not present

## 2016-04-03 DIAGNOSIS — I10 Essential (primary) hypertension: Secondary | ICD-10-CM | POA: Diagnosis not present

## 2016-04-03 DIAGNOSIS — I2510 Atherosclerotic heart disease of native coronary artery without angina pectoris: Secondary | ICD-10-CM | POA: Diagnosis not present

## 2016-05-01 DIAGNOSIS — M545 Low back pain: Secondary | ICD-10-CM | POA: Diagnosis not present

## 2016-05-01 DIAGNOSIS — I2510 Atherosclerotic heart disease of native coronary artery without angina pectoris: Secondary | ICD-10-CM | POA: Diagnosis not present

## 2016-05-01 DIAGNOSIS — I10 Essential (primary) hypertension: Secondary | ICD-10-CM | POA: Diagnosis not present

## 2016-05-01 DIAGNOSIS — E785 Hyperlipidemia, unspecified: Secondary | ICD-10-CM | POA: Diagnosis not present

## 2016-05-01 DIAGNOSIS — E039 Hypothyroidism, unspecified: Secondary | ICD-10-CM | POA: Diagnosis not present

## 2016-05-01 DIAGNOSIS — K210 Gastro-esophageal reflux disease with esophagitis: Secondary | ICD-10-CM | POA: Diagnosis not present

## 2016-05-21 IMAGING — CR DG CHEST 2V
3 series · 3 of 3 positions shown · non-contrast
Comparison: 10/01/2013.

CLINICAL DATA: History of asthma.  Preoperative exam.

EXAM:
CHEST  2 VIEW

[w chest pa]
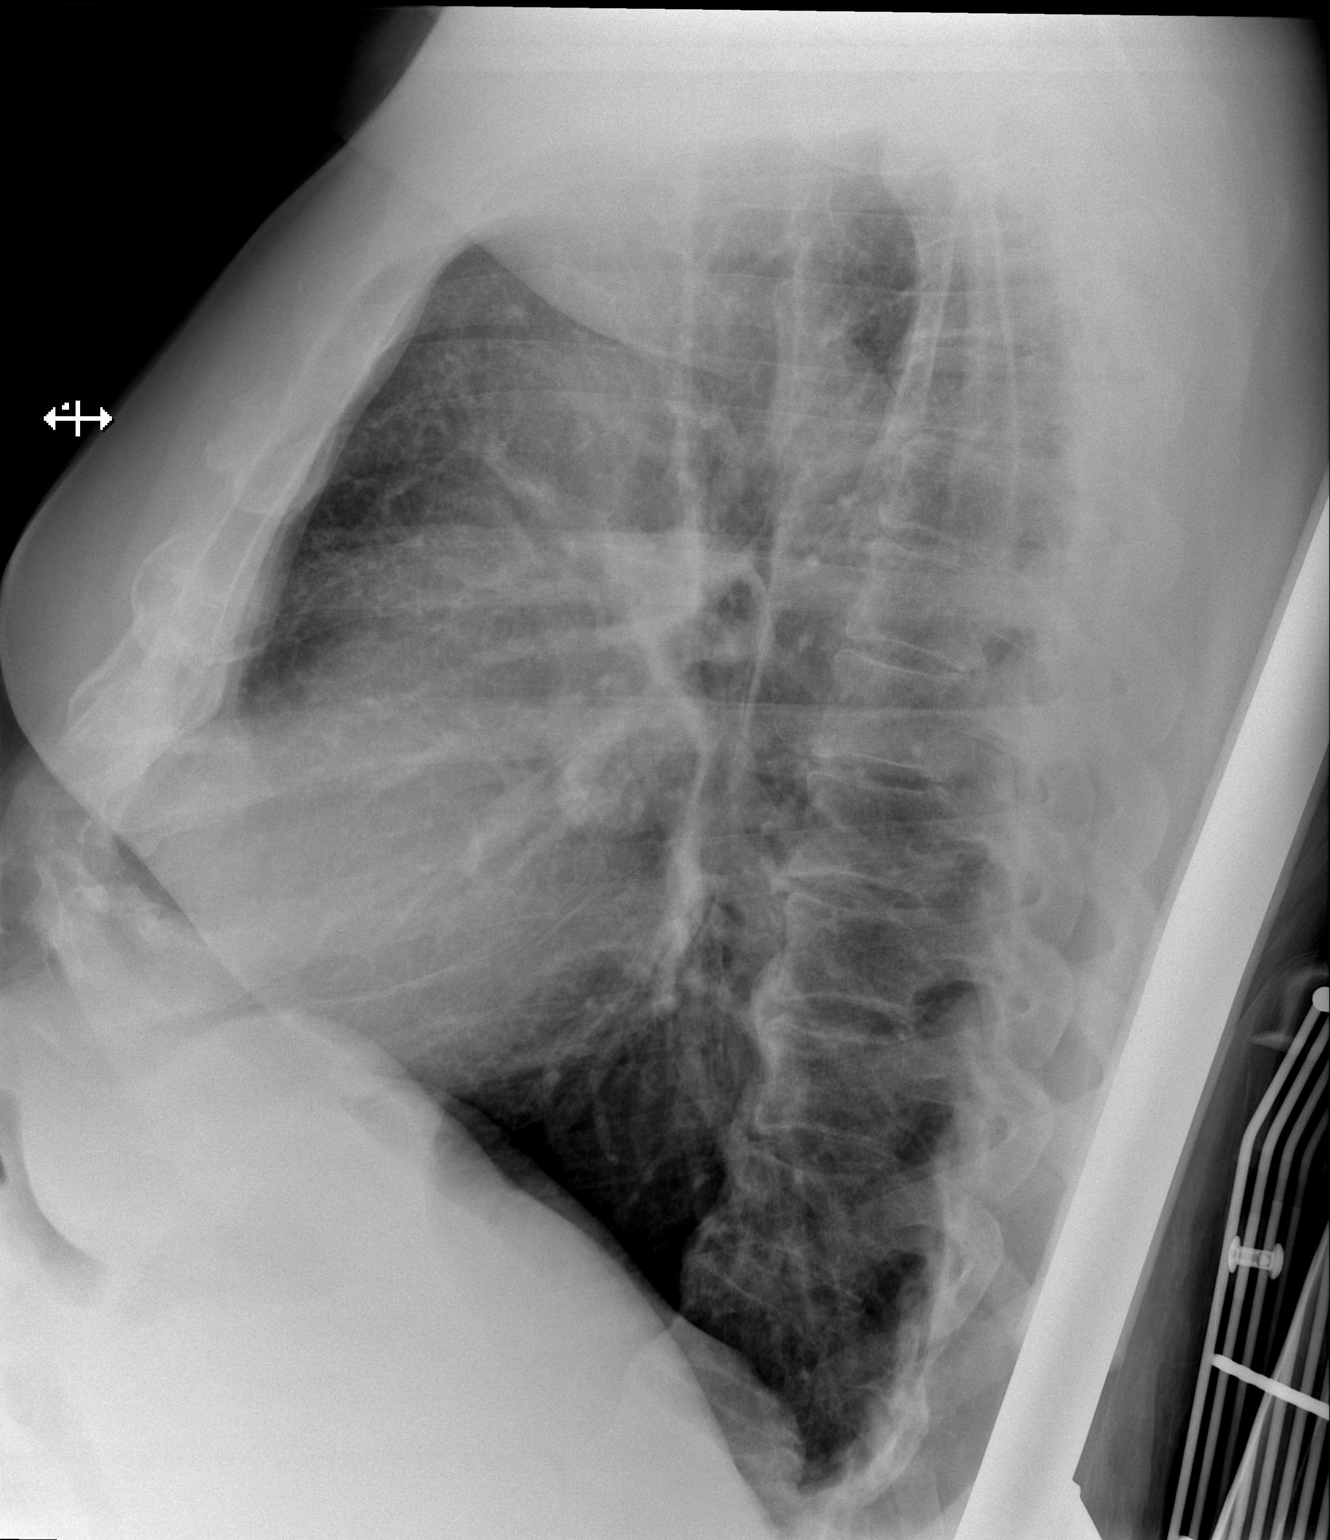

[x chest ap (1 of 2)]
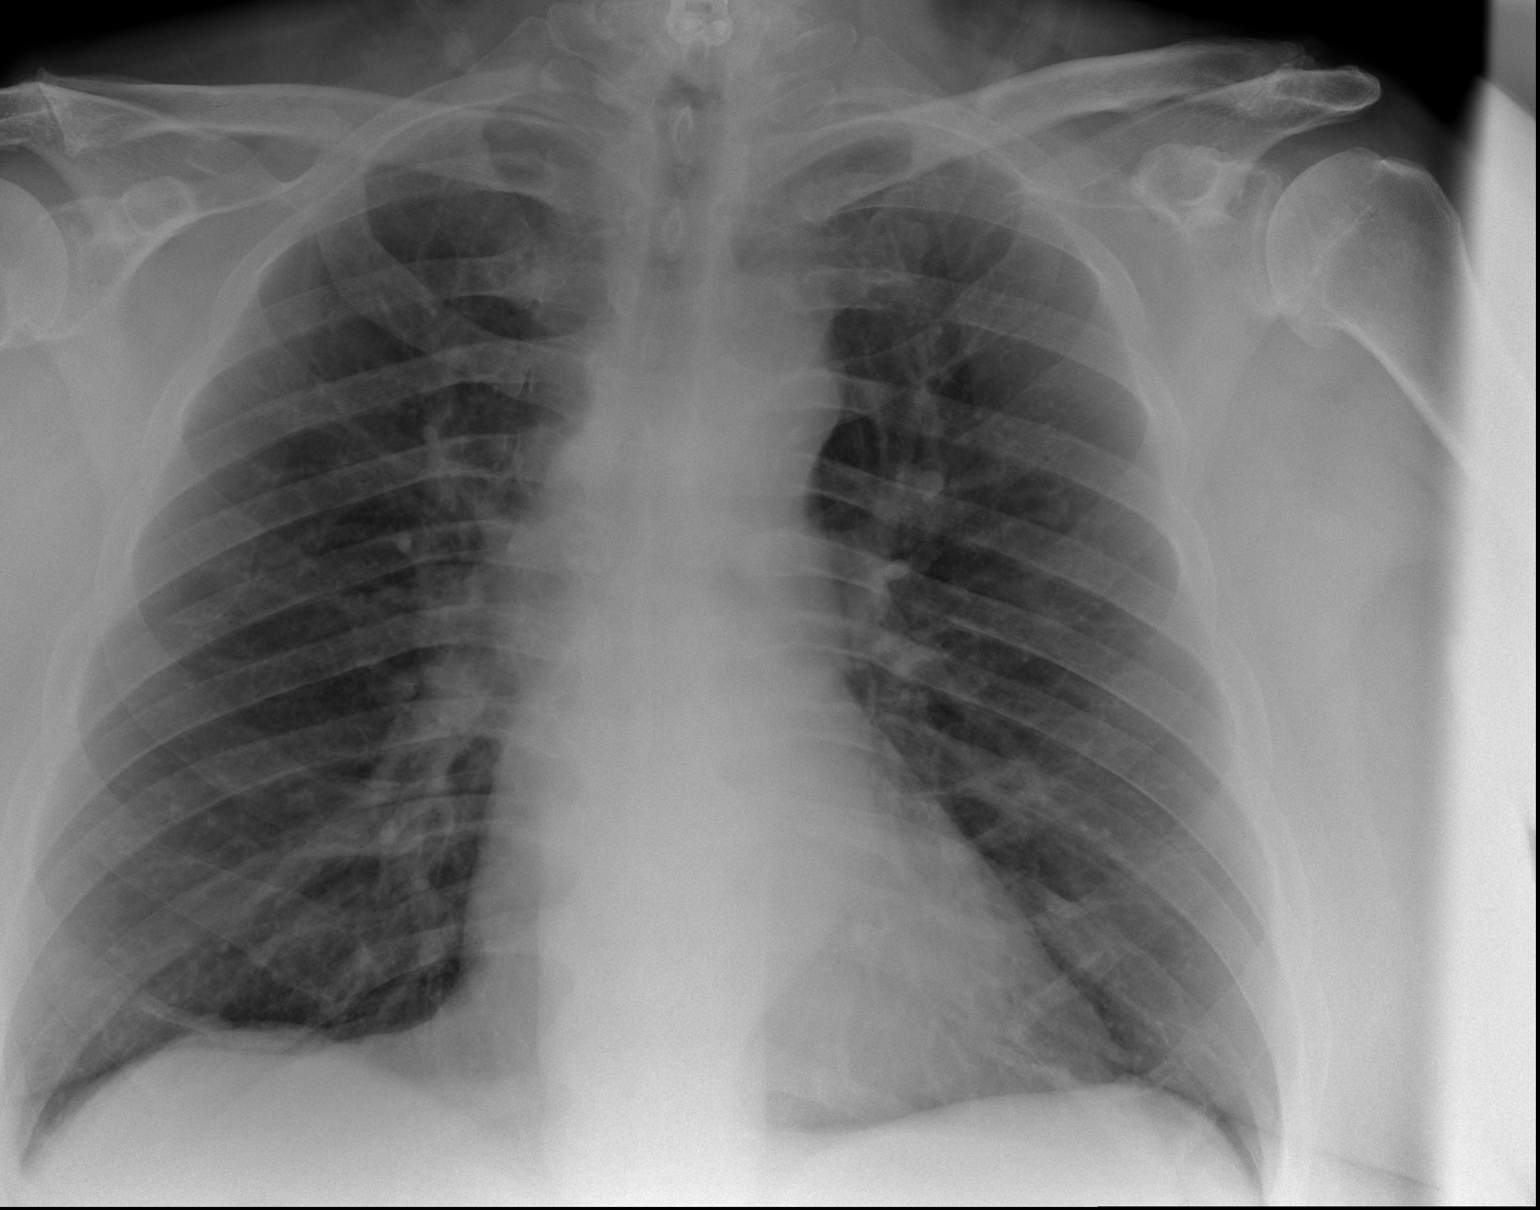

[x chest ap (2 of 2)]
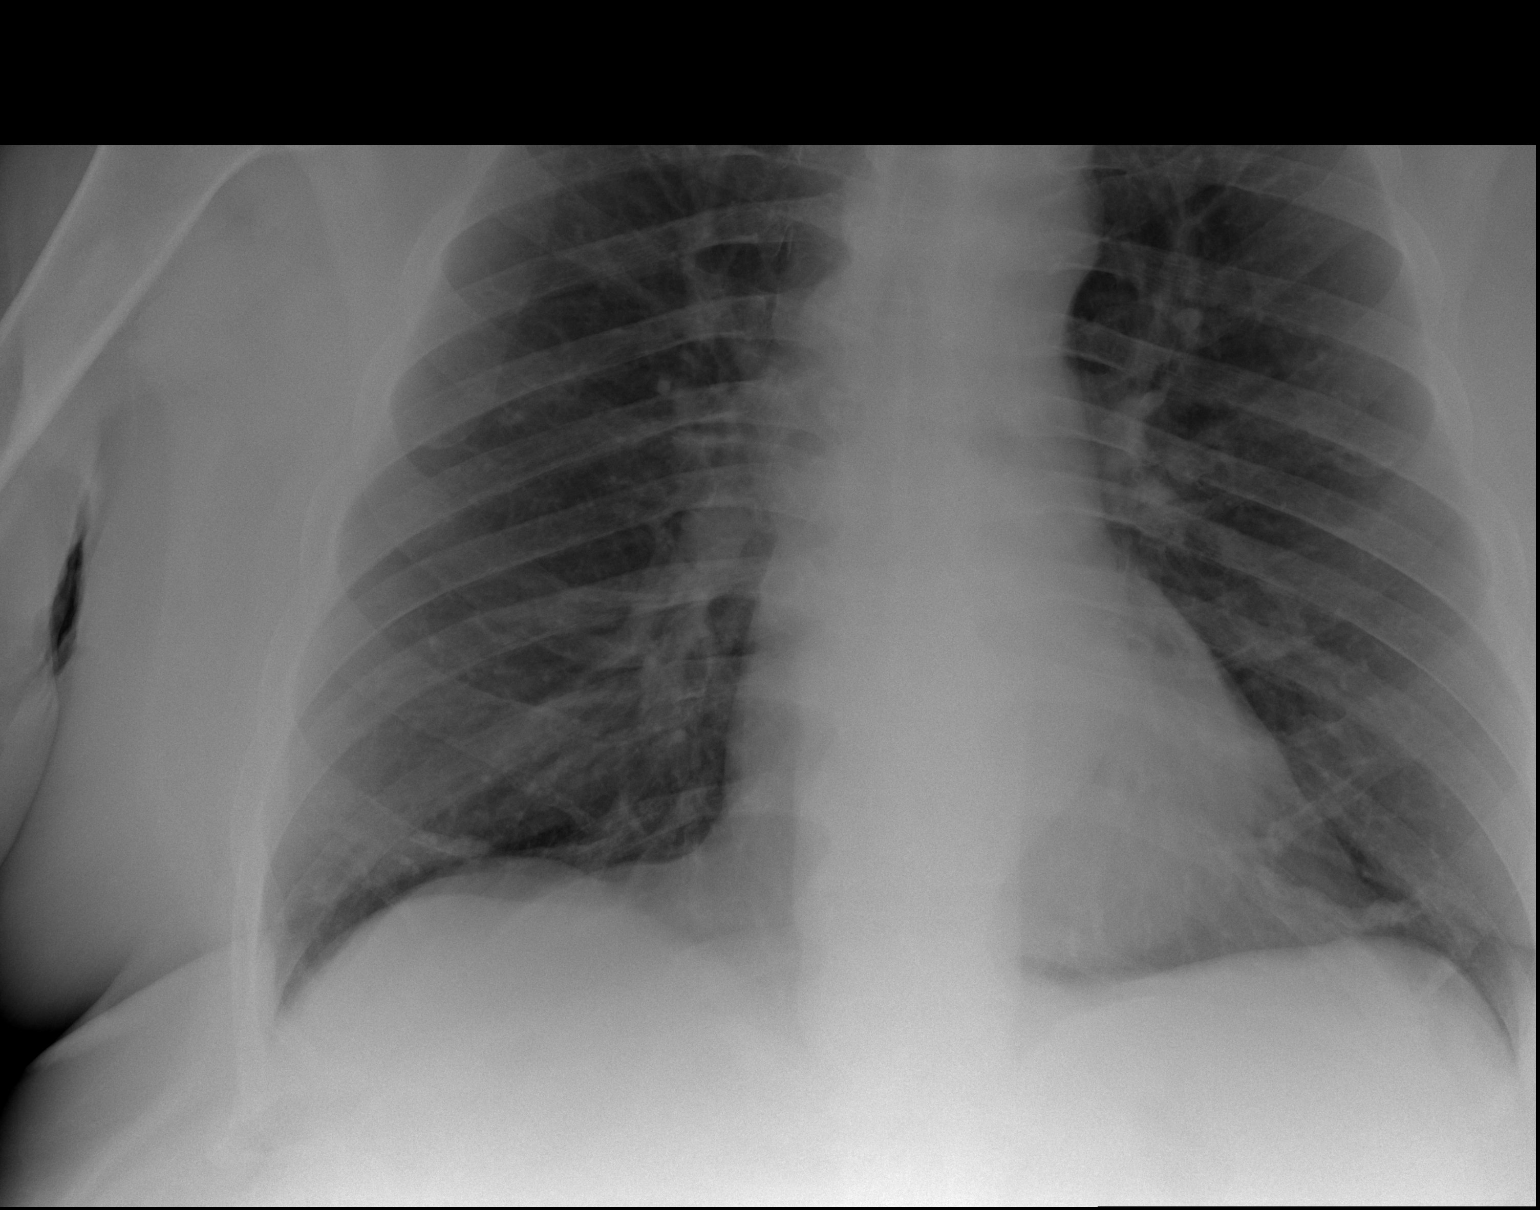

[3 of 3 positions shown; findings below may reference images not displayed]

FINDINGS: Mediastinum and hilar structures are normal. Lungs are clear.
Cardiomegaly with normal pulmonary vascularity. No pleural effusion
or pneumothorax. Degenerative changes thoracic spine.
IMPRESSION: No acute cardiopulmonary disease.

## 2016-05-29 DIAGNOSIS — I2510 Atherosclerotic heart disease of native coronary artery without angina pectoris: Secondary | ICD-10-CM | POA: Diagnosis not present

## 2016-05-29 DIAGNOSIS — Z139 Encounter for screening, unspecified: Secondary | ICD-10-CM | POA: Diagnosis not present

## 2016-05-29 DIAGNOSIS — E039 Hypothyroidism, unspecified: Secondary | ICD-10-CM | POA: Diagnosis not present

## 2016-05-29 DIAGNOSIS — M159 Polyosteoarthritis, unspecified: Secondary | ICD-10-CM | POA: Diagnosis not present

## 2016-05-29 DIAGNOSIS — I10 Essential (primary) hypertension: Secondary | ICD-10-CM | POA: Diagnosis not present

## 2016-07-10 DIAGNOSIS — K210 Gastro-esophageal reflux disease with esophagitis: Secondary | ICD-10-CM | POA: Diagnosis not present

## 2016-07-10 DIAGNOSIS — I10 Essential (primary) hypertension: Secondary | ICD-10-CM | POA: Diagnosis not present

## 2016-07-10 DIAGNOSIS — E039 Hypothyroidism, unspecified: Secondary | ICD-10-CM | POA: Diagnosis not present

## 2016-07-10 DIAGNOSIS — I2510 Atherosclerotic heart disease of native coronary artery without angina pectoris: Secondary | ICD-10-CM | POA: Diagnosis not present

## 2016-07-10 DIAGNOSIS — M545 Low back pain: Secondary | ICD-10-CM | POA: Diagnosis not present

## 2016-07-15 DIAGNOSIS — Z139 Encounter for screening, unspecified: Secondary | ICD-10-CM | POA: Diagnosis not present

## 2016-07-15 DIAGNOSIS — I10 Essential (primary) hypertension: Secondary | ICD-10-CM | POA: Diagnosis not present

## 2016-07-15 DIAGNOSIS — Z9181 History of falling: Secondary | ICD-10-CM | POA: Diagnosis not present

## 2016-07-15 DIAGNOSIS — E039 Hypothyroidism, unspecified: Secondary | ICD-10-CM | POA: Diagnosis not present

## 2016-07-15 DIAGNOSIS — I2510 Atherosclerotic heart disease of native coronary artery without angina pectoris: Secondary | ICD-10-CM | POA: Diagnosis not present

## 2016-07-15 DIAGNOSIS — M545 Low back pain: Secondary | ICD-10-CM | POA: Diagnosis not present

## 2016-07-29 DIAGNOSIS — E039 Hypothyroidism, unspecified: Secondary | ICD-10-CM | POA: Diagnosis not present

## 2016-07-29 DIAGNOSIS — K210 Gastro-esophageal reflux disease with esophagitis: Secondary | ICD-10-CM | POA: Diagnosis not present

## 2016-07-29 DIAGNOSIS — M545 Low back pain: Secondary | ICD-10-CM | POA: Diagnosis not present

## 2016-07-29 DIAGNOSIS — E538 Deficiency of other specified B group vitamins: Secondary | ICD-10-CM | POA: Diagnosis not present

## 2016-07-29 DIAGNOSIS — M159 Polyosteoarthritis, unspecified: Secondary | ICD-10-CM | POA: Diagnosis not present

## 2016-07-29 DIAGNOSIS — I2510 Atherosclerotic heart disease of native coronary artery without angina pectoris: Secondary | ICD-10-CM | POA: Diagnosis not present

## 2016-08-03 DIAGNOSIS — E538 Deficiency of other specified B group vitamins: Secondary | ICD-10-CM | POA: Diagnosis not present

## 2016-08-03 DIAGNOSIS — K210 Gastro-esophageal reflux disease with esophagitis: Secondary | ICD-10-CM | POA: Diagnosis not present

## 2016-08-03 DIAGNOSIS — I2510 Atherosclerotic heart disease of native coronary artery without angina pectoris: Secondary | ICD-10-CM | POA: Diagnosis not present

## 2016-08-03 DIAGNOSIS — M545 Low back pain: Secondary | ICD-10-CM | POA: Diagnosis not present

## 2016-08-03 DIAGNOSIS — N390 Urinary tract infection, site not specified: Secondary | ICD-10-CM | POA: Diagnosis not present

## 2016-08-19 DIAGNOSIS — L270 Generalized skin eruption due to drugs and medicaments taken internally: Secondary | ICD-10-CM | POA: Diagnosis not present

## 2016-08-19 DIAGNOSIS — T368X5A Adverse effect of other systemic antibiotics, initial encounter: Secondary | ICD-10-CM | POA: Diagnosis not present

## 2016-08-28 DIAGNOSIS — K210 Gastro-esophageal reflux disease with esophagitis: Secondary | ICD-10-CM | POA: Diagnosis not present

## 2016-08-28 DIAGNOSIS — M545 Low back pain: Secondary | ICD-10-CM | POA: Diagnosis not present

## 2016-08-28 DIAGNOSIS — I2510 Atherosclerotic heart disease of native coronary artery without angina pectoris: Secondary | ICD-10-CM | POA: Diagnosis not present

## 2016-08-28 DIAGNOSIS — E538 Deficiency of other specified B group vitamins: Secondary | ICD-10-CM | POA: Diagnosis not present

## 2016-08-28 DIAGNOSIS — K589 Irritable bowel syndrome without diarrhea: Secondary | ICD-10-CM | POA: Diagnosis not present

## 2016-09-11 DIAGNOSIS — E538 Deficiency of other specified B group vitamins: Secondary | ICD-10-CM | POA: Diagnosis not present

## 2016-09-11 DIAGNOSIS — E559 Vitamin D deficiency, unspecified: Secondary | ICD-10-CM | POA: Diagnosis not present

## 2016-09-11 DIAGNOSIS — E785 Hyperlipidemia, unspecified: Secondary | ICD-10-CM | POA: Diagnosis not present

## 2016-09-11 DIAGNOSIS — I10 Essential (primary) hypertension: Secondary | ICD-10-CM | POA: Diagnosis not present

## 2016-09-11 DIAGNOSIS — M545 Low back pain: Secondary | ICD-10-CM | POA: Diagnosis not present

## 2016-09-11 DIAGNOSIS — L739 Follicular disorder, unspecified: Secondary | ICD-10-CM | POA: Diagnosis not present

## 2016-09-11 DIAGNOSIS — I2510 Atherosclerotic heart disease of native coronary artery without angina pectoris: Secondary | ICD-10-CM | POA: Diagnosis not present

## 2016-09-20 DIAGNOSIS — H2513 Age-related nuclear cataract, bilateral: Secondary | ICD-10-CM | POA: Diagnosis not present

## 2016-09-20 DIAGNOSIS — H401131 Primary open-angle glaucoma, bilateral, mild stage: Secondary | ICD-10-CM | POA: Diagnosis not present

## 2016-09-30 DIAGNOSIS — I2510 Atherosclerotic heart disease of native coronary artery without angina pectoris: Secondary | ICD-10-CM | POA: Diagnosis not present

## 2016-09-30 DIAGNOSIS — Z1389 Encounter for screening for other disorder: Secondary | ICD-10-CM | POA: Diagnosis not present

## 2016-09-30 DIAGNOSIS — Z9181 History of falling: Secondary | ICD-10-CM | POA: Diagnosis not present

## 2016-09-30 DIAGNOSIS — E559 Vitamin D deficiency, unspecified: Secondary | ICD-10-CM | POA: Diagnosis not present

## 2016-09-30 DIAGNOSIS — E538 Deficiency of other specified B group vitamins: Secondary | ICD-10-CM | POA: Diagnosis not present

## 2016-09-30 DIAGNOSIS — M545 Low back pain: Secondary | ICD-10-CM | POA: Diagnosis not present

## 2016-10-22 DIAGNOSIS — M545 Low back pain: Secondary | ICD-10-CM | POA: Diagnosis not present

## 2016-10-22 DIAGNOSIS — K210 Gastro-esophageal reflux disease with esophagitis: Secondary | ICD-10-CM | POA: Diagnosis not present

## 2016-10-22 DIAGNOSIS — I2510 Atherosclerotic heart disease of native coronary artery without angina pectoris: Secondary | ICD-10-CM | POA: Diagnosis not present

## 2016-10-22 DIAGNOSIS — E559 Vitamin D deficiency, unspecified: Secondary | ICD-10-CM | POA: Diagnosis not present

## 2016-10-22 DIAGNOSIS — E538 Deficiency of other specified B group vitamins: Secondary | ICD-10-CM | POA: Diagnosis not present

## 2016-11-19 DIAGNOSIS — I2510 Atherosclerotic heart disease of native coronary artery without angina pectoris: Secondary | ICD-10-CM | POA: Diagnosis not present

## 2016-11-19 DIAGNOSIS — E538 Deficiency of other specified B group vitamins: Secondary | ICD-10-CM | POA: Diagnosis not present

## 2016-11-19 DIAGNOSIS — N400 Enlarged prostate without lower urinary tract symptoms: Secondary | ICD-10-CM | POA: Diagnosis not present

## 2016-11-19 DIAGNOSIS — M545 Low back pain: Secondary | ICD-10-CM | POA: Diagnosis not present

## 2016-11-19 DIAGNOSIS — E559 Vitamin D deficiency, unspecified: Secondary | ICD-10-CM | POA: Diagnosis not present

## 2016-12-30 DIAGNOSIS — M545 Low back pain: Secondary | ICD-10-CM | POA: Diagnosis not present

## 2016-12-30 DIAGNOSIS — I2510 Atherosclerotic heart disease of native coronary artery without angina pectoris: Secondary | ICD-10-CM | POA: Diagnosis not present

## 2016-12-30 DIAGNOSIS — J208 Acute bronchitis due to other specified organisms: Secondary | ICD-10-CM | POA: Diagnosis not present

## 2016-12-30 DIAGNOSIS — E559 Vitamin D deficiency, unspecified: Secondary | ICD-10-CM | POA: Diagnosis not present

## 2016-12-30 DIAGNOSIS — E538 Deficiency of other specified B group vitamins: Secondary | ICD-10-CM | POA: Diagnosis not present

## 2017-01-08 DIAGNOSIS — I2510 Atherosclerotic heart disease of native coronary artery without angina pectoris: Secondary | ICD-10-CM | POA: Diagnosis not present

## 2017-01-08 DIAGNOSIS — J449 Chronic obstructive pulmonary disease, unspecified: Secondary | ICD-10-CM | POA: Diagnosis not present

## 2017-01-08 DIAGNOSIS — M545 Low back pain: Secondary | ICD-10-CM | POA: Diagnosis not present

## 2017-01-08 DIAGNOSIS — E559 Vitamin D deficiency, unspecified: Secondary | ICD-10-CM | POA: Diagnosis not present

## 2017-01-08 DIAGNOSIS — E538 Deficiency of other specified B group vitamins: Secondary | ICD-10-CM | POA: Diagnosis not present

## 2017-01-15 DIAGNOSIS — J449 Chronic obstructive pulmonary disease, unspecified: Secondary | ICD-10-CM | POA: Diagnosis not present

## 2017-01-15 DIAGNOSIS — J208 Acute bronchitis due to other specified organisms: Secondary | ICD-10-CM | POA: Diagnosis not present

## 2017-01-15 DIAGNOSIS — R5382 Chronic fatigue, unspecified: Secondary | ICD-10-CM | POA: Diagnosis not present

## 2017-01-15 DIAGNOSIS — I2510 Atherosclerotic heart disease of native coronary artery without angina pectoris: Secondary | ICD-10-CM | POA: Diagnosis not present

## 2017-01-15 DIAGNOSIS — M545 Low back pain: Secondary | ICD-10-CM | POA: Diagnosis not present

## 2017-01-28 DIAGNOSIS — Z0000 Encounter for general adult medical examination without abnormal findings: Secondary | ICD-10-CM | POA: Diagnosis not present

## 2017-01-28 DIAGNOSIS — Z9181 History of falling: Secondary | ICD-10-CM | POA: Diagnosis not present

## 2017-01-28 DIAGNOSIS — E785 Hyperlipidemia, unspecified: Secondary | ICD-10-CM | POA: Diagnosis not present

## 2017-01-28 DIAGNOSIS — Z125 Encounter for screening for malignant neoplasm of prostate: Secondary | ICD-10-CM | POA: Diagnosis not present

## 2017-01-28 DIAGNOSIS — Z1331 Encounter for screening for depression: Secondary | ICD-10-CM | POA: Diagnosis not present

## 2017-02-05 DIAGNOSIS — E538 Deficiency of other specified B group vitamins: Secondary | ICD-10-CM | POA: Diagnosis not present

## 2017-02-05 DIAGNOSIS — I10 Essential (primary) hypertension: Secondary | ICD-10-CM | POA: Diagnosis not present

## 2017-02-05 DIAGNOSIS — G4700 Insomnia, unspecified: Secondary | ICD-10-CM | POA: Diagnosis not present

## 2017-02-05 DIAGNOSIS — E039 Hypothyroidism, unspecified: Secondary | ICD-10-CM | POA: Diagnosis not present

## 2017-02-05 DIAGNOSIS — E559 Vitamin D deficiency, unspecified: Secondary | ICD-10-CM | POA: Diagnosis not present

## 2017-02-05 DIAGNOSIS — N400 Enlarged prostate without lower urinary tract symptoms: Secondary | ICD-10-CM | POA: Diagnosis not present

## 2017-02-05 DIAGNOSIS — E785 Hyperlipidemia, unspecified: Secondary | ICD-10-CM | POA: Diagnosis not present

## 2017-02-05 DIAGNOSIS — M542 Cervicalgia: Secondary | ICD-10-CM | POA: Diagnosis not present

## 2017-02-21 DIAGNOSIS — E039 Hypothyroidism, unspecified: Secondary | ICD-10-CM | POA: Diagnosis not present

## 2017-02-21 DIAGNOSIS — J208 Acute bronchitis due to other specified organisms: Secondary | ICD-10-CM | POA: Diagnosis not present

## 2017-02-21 DIAGNOSIS — M545 Low back pain: Secondary | ICD-10-CM | POA: Diagnosis not present

## 2017-02-21 DIAGNOSIS — K589 Irritable bowel syndrome without diarrhea: Secondary | ICD-10-CM | POA: Diagnosis not present

## 2017-02-21 DIAGNOSIS — I2510 Atherosclerotic heart disease of native coronary artery without angina pectoris: Secondary | ICD-10-CM | POA: Diagnosis not present

## 2017-03-05 DIAGNOSIS — K589 Irritable bowel syndrome without diarrhea: Secondary | ICD-10-CM | POA: Diagnosis not present

## 2017-03-05 DIAGNOSIS — R0602 Shortness of breath: Secondary | ICD-10-CM | POA: Diagnosis not present

## 2017-03-05 DIAGNOSIS — R05 Cough: Secondary | ICD-10-CM | POA: Diagnosis not present

## 2017-03-05 DIAGNOSIS — J208 Acute bronchitis due to other specified organisms: Secondary | ICD-10-CM | POA: Diagnosis not present

## 2017-03-05 DIAGNOSIS — E039 Hypothyroidism, unspecified: Secondary | ICD-10-CM | POA: Diagnosis not present

## 2017-03-05 DIAGNOSIS — I2510 Atherosclerotic heart disease of native coronary artery without angina pectoris: Secondary | ICD-10-CM | POA: Diagnosis not present

## 2017-03-05 DIAGNOSIS — M545 Low back pain: Secondary | ICD-10-CM | POA: Diagnosis not present

## 2017-03-12 DIAGNOSIS — I2510 Atherosclerotic heart disease of native coronary artery without angina pectoris: Secondary | ICD-10-CM | POA: Diagnosis not present

## 2017-03-12 DIAGNOSIS — M545 Low back pain: Secondary | ICD-10-CM | POA: Diagnosis not present

## 2017-03-12 DIAGNOSIS — E039 Hypothyroidism, unspecified: Secondary | ICD-10-CM | POA: Diagnosis not present

## 2017-03-12 DIAGNOSIS — K589 Irritable bowel syndrome without diarrhea: Secondary | ICD-10-CM | POA: Diagnosis not present

## 2017-03-12 DIAGNOSIS — J449 Chronic obstructive pulmonary disease, unspecified: Secondary | ICD-10-CM | POA: Diagnosis not present

## 2017-04-02 DIAGNOSIS — K589 Irritable bowel syndrome without diarrhea: Secondary | ICD-10-CM | POA: Diagnosis not present

## 2017-04-02 DIAGNOSIS — J449 Chronic obstructive pulmonary disease, unspecified: Secondary | ICD-10-CM | POA: Diagnosis not present

## 2017-04-02 DIAGNOSIS — M545 Low back pain: Secondary | ICD-10-CM | POA: Diagnosis not present

## 2017-04-02 DIAGNOSIS — I2510 Atherosclerotic heart disease of native coronary artery without angina pectoris: Secondary | ICD-10-CM | POA: Diagnosis not present

## 2017-04-02 DIAGNOSIS — N400 Enlarged prostate without lower urinary tract symptoms: Secondary | ICD-10-CM | POA: Diagnosis not present

## 2017-04-02 DIAGNOSIS — E039 Hypothyroidism, unspecified: Secondary | ICD-10-CM | POA: Diagnosis not present

## 2017-04-10 DIAGNOSIS — E039 Hypothyroidism, unspecified: Secondary | ICD-10-CM | POA: Diagnosis not present

## 2017-04-10 DIAGNOSIS — L03119 Cellulitis of unspecified part of limb: Secondary | ICD-10-CM | POA: Diagnosis not present

## 2017-04-10 DIAGNOSIS — K589 Irritable bowel syndrome without diarrhea: Secondary | ICD-10-CM | POA: Diagnosis not present

## 2017-04-10 DIAGNOSIS — M545 Low back pain: Secondary | ICD-10-CM | POA: Diagnosis not present

## 2017-04-10 DIAGNOSIS — I2510 Atherosclerotic heart disease of native coronary artery without angina pectoris: Secondary | ICD-10-CM | POA: Diagnosis not present

## 2017-04-14 DIAGNOSIS — K589 Irritable bowel syndrome without diarrhea: Secondary | ICD-10-CM | POA: Diagnosis not present

## 2017-04-14 DIAGNOSIS — I2510 Atherosclerotic heart disease of native coronary artery without angina pectoris: Secondary | ICD-10-CM | POA: Diagnosis not present

## 2017-04-14 DIAGNOSIS — E039 Hypothyroidism, unspecified: Secondary | ICD-10-CM | POA: Diagnosis not present

## 2017-04-14 DIAGNOSIS — M545 Low back pain: Secondary | ICD-10-CM | POA: Diagnosis not present

## 2017-04-14 DIAGNOSIS — L03119 Cellulitis of unspecified part of limb: Secondary | ICD-10-CM | POA: Diagnosis not present

## 2017-04-21 DIAGNOSIS — M545 Low back pain: Secondary | ICD-10-CM | POA: Diagnosis not present

## 2017-04-21 DIAGNOSIS — E039 Hypothyroidism, unspecified: Secondary | ICD-10-CM | POA: Diagnosis not present

## 2017-04-21 DIAGNOSIS — K589 Irritable bowel syndrome without diarrhea: Secondary | ICD-10-CM | POA: Diagnosis not present

## 2017-04-21 DIAGNOSIS — I2510 Atherosclerotic heart disease of native coronary artery without angina pectoris: Secondary | ICD-10-CM | POA: Diagnosis not present

## 2017-04-21 DIAGNOSIS — L03119 Cellulitis of unspecified part of limb: Secondary | ICD-10-CM | POA: Diagnosis not present

## 2017-04-28 DIAGNOSIS — J449 Chronic obstructive pulmonary disease, unspecified: Secondary | ICD-10-CM | POA: Diagnosis not present

## 2017-04-28 DIAGNOSIS — I2510 Atherosclerotic heart disease of native coronary artery without angina pectoris: Secondary | ICD-10-CM | POA: Diagnosis not present

## 2017-04-28 DIAGNOSIS — K589 Irritable bowel syndrome without diarrhea: Secondary | ICD-10-CM | POA: Diagnosis not present

## 2017-04-28 DIAGNOSIS — J208 Acute bronchitis due to other specified organisms: Secondary | ICD-10-CM | POA: Diagnosis not present

## 2017-04-28 DIAGNOSIS — E039 Hypothyroidism, unspecified: Secondary | ICD-10-CM | POA: Diagnosis not present

## 2017-04-30 DIAGNOSIS — J449 Chronic obstructive pulmonary disease, unspecified: Secondary | ICD-10-CM | POA: Diagnosis not present

## 2017-04-30 DIAGNOSIS — M542 Cervicalgia: Secondary | ICD-10-CM | POA: Diagnosis not present

## 2017-04-30 DIAGNOSIS — I2510 Atherosclerotic heart disease of native coronary artery without angina pectoris: Secondary | ICD-10-CM | POA: Diagnosis not present

## 2017-04-30 DIAGNOSIS — I10 Essential (primary) hypertension: Secondary | ICD-10-CM | POA: Diagnosis not present

## 2017-04-30 DIAGNOSIS — K589 Irritable bowel syndrome without diarrhea: Secondary | ICD-10-CM | POA: Diagnosis not present

## 2017-04-30 DIAGNOSIS — E039 Hypothyroidism, unspecified: Secondary | ICD-10-CM | POA: Diagnosis not present

## 2017-05-28 DIAGNOSIS — K589 Irritable bowel syndrome without diarrhea: Secondary | ICD-10-CM | POA: Diagnosis not present

## 2017-05-28 DIAGNOSIS — E039 Hypothyroidism, unspecified: Secondary | ICD-10-CM | POA: Diagnosis not present

## 2017-05-28 DIAGNOSIS — J449 Chronic obstructive pulmonary disease, unspecified: Secondary | ICD-10-CM | POA: Diagnosis not present

## 2017-05-28 DIAGNOSIS — E785 Hyperlipidemia, unspecified: Secondary | ICD-10-CM | POA: Diagnosis not present

## 2017-05-28 DIAGNOSIS — I2510 Atherosclerotic heart disease of native coronary artery without angina pectoris: Secondary | ICD-10-CM | POA: Diagnosis not present

## 2017-05-28 DIAGNOSIS — M542 Cervicalgia: Secondary | ICD-10-CM | POA: Diagnosis not present

## 2017-06-10 DIAGNOSIS — H401131 Primary open-angle glaucoma, bilateral, mild stage: Secondary | ICD-10-CM | POA: Diagnosis not present

## 2017-06-11 DIAGNOSIS — E039 Hypothyroidism, unspecified: Secondary | ICD-10-CM | POA: Diagnosis not present

## 2017-06-11 DIAGNOSIS — I2510 Atherosclerotic heart disease of native coronary artery without angina pectoris: Secondary | ICD-10-CM | POA: Diagnosis not present

## 2017-06-11 DIAGNOSIS — J449 Chronic obstructive pulmonary disease, unspecified: Secondary | ICD-10-CM | POA: Diagnosis not present

## 2017-06-11 DIAGNOSIS — M542 Cervicalgia: Secondary | ICD-10-CM | POA: Diagnosis not present

## 2017-06-11 DIAGNOSIS — K589 Irritable bowel syndrome without diarrhea: Secondary | ICD-10-CM | POA: Diagnosis not present

## 2017-06-25 DIAGNOSIS — K589 Irritable bowel syndrome without diarrhea: Secondary | ICD-10-CM | POA: Diagnosis not present

## 2017-06-25 DIAGNOSIS — M542 Cervicalgia: Secondary | ICD-10-CM | POA: Diagnosis not present

## 2017-06-25 DIAGNOSIS — J449 Chronic obstructive pulmonary disease, unspecified: Secondary | ICD-10-CM | POA: Diagnosis not present

## 2017-06-25 DIAGNOSIS — I2510 Atherosclerotic heart disease of native coronary artery without angina pectoris: Secondary | ICD-10-CM | POA: Diagnosis not present

## 2017-06-25 DIAGNOSIS — E039 Hypothyroidism, unspecified: Secondary | ICD-10-CM | POA: Diagnosis not present

## 2017-07-09 DIAGNOSIS — I2510 Atherosclerotic heart disease of native coronary artery without angina pectoris: Secondary | ICD-10-CM | POA: Diagnosis not present

## 2017-07-09 DIAGNOSIS — K589 Irritable bowel syndrome without diarrhea: Secondary | ICD-10-CM | POA: Diagnosis not present

## 2017-07-09 DIAGNOSIS — M542 Cervicalgia: Secondary | ICD-10-CM | POA: Diagnosis not present

## 2017-07-09 DIAGNOSIS — J449 Chronic obstructive pulmonary disease, unspecified: Secondary | ICD-10-CM | POA: Diagnosis not present

## 2017-07-09 DIAGNOSIS — E039 Hypothyroidism, unspecified: Secondary | ICD-10-CM | POA: Diagnosis not present

## 2017-07-25 DIAGNOSIS — E039 Hypothyroidism, unspecified: Secondary | ICD-10-CM | POA: Diagnosis not present

## 2017-07-25 DIAGNOSIS — I2510 Atherosclerotic heart disease of native coronary artery without angina pectoris: Secondary | ICD-10-CM | POA: Diagnosis not present

## 2017-07-25 DIAGNOSIS — L03119 Cellulitis of unspecified part of limb: Secondary | ICD-10-CM | POA: Diagnosis not present

## 2017-07-25 DIAGNOSIS — E538 Deficiency of other specified B group vitamins: Secondary | ICD-10-CM | POA: Diagnosis not present

## 2017-07-25 DIAGNOSIS — M542 Cervicalgia: Secondary | ICD-10-CM | POA: Diagnosis not present

## 2017-07-30 DIAGNOSIS — E559 Vitamin D deficiency, unspecified: Secondary | ICD-10-CM | POA: Diagnosis not present

## 2017-07-30 DIAGNOSIS — J449 Chronic obstructive pulmonary disease, unspecified: Secondary | ICD-10-CM | POA: Diagnosis not present

## 2017-07-30 DIAGNOSIS — I2510 Atherosclerotic heart disease of native coronary artery without angina pectoris: Secondary | ICD-10-CM | POA: Diagnosis not present

## 2017-07-30 DIAGNOSIS — J208 Acute bronchitis due to other specified organisms: Secondary | ICD-10-CM | POA: Diagnosis not present

## 2017-07-30 DIAGNOSIS — E039 Hypothyroidism, unspecified: Secondary | ICD-10-CM | POA: Diagnosis not present

## 2017-08-01 ENCOUNTER — Inpatient Hospital Stay (HOSPITAL_COMMUNITY)
Admission: AD | Admit: 2017-08-01 | Discharge: 2017-08-07 | DRG: 175 | Disposition: A | Discharge status: Home Health | Payer: Medicare Other | Source: Other Acute Inpatient Hospital | Attending: Internal Medicine | Admitting: Internal Medicine

## 2017-08-01 DIAGNOSIS — I2699 Other pulmonary embolism without acute cor pulmonale: Principal | ICD-10-CM | POA: Diagnosis present

## 2017-08-01 DIAGNOSIS — Z96651 Presence of right artificial knee joint: Secondary | ICD-10-CM | POA: Diagnosis present

## 2017-08-01 DIAGNOSIS — Z981 Arthrodesis status: Secondary | ICD-10-CM

## 2017-08-01 DIAGNOSIS — M549 Dorsalgia, unspecified: Secondary | ICD-10-CM | POA: Diagnosis present

## 2017-08-01 DIAGNOSIS — M47814 Spondylosis without myelopathy or radiculopathy, thoracic region: Secondary | ICD-10-CM | POA: Diagnosis not present

## 2017-08-01 DIAGNOSIS — Z87891 Personal history of nicotine dependence: Secondary | ICD-10-CM

## 2017-08-01 DIAGNOSIS — Z7982 Long term (current) use of aspirin: Secondary | ICD-10-CM

## 2017-08-01 DIAGNOSIS — I82432 Acute embolism and thrombosis of left popliteal vein: Secondary | ICD-10-CM | POA: Diagnosis not present

## 2017-08-01 DIAGNOSIS — Z79891 Long term (current) use of opiate analgesic: Secondary | ICD-10-CM

## 2017-08-01 DIAGNOSIS — I82411 Acute embolism and thrombosis of right femoral vein: Secondary | ICD-10-CM | POA: Diagnosis not present

## 2017-08-01 DIAGNOSIS — I2510 Atherosclerotic heart disease of native coronary artery without angina pectoris: Secondary | ICD-10-CM | POA: Diagnosis not present

## 2017-08-01 DIAGNOSIS — I82441 Acute embolism and thrombosis of right tibial vein: Secondary | ICD-10-CM | POA: Diagnosis not present

## 2017-08-01 DIAGNOSIS — E039 Hypothyroidism, unspecified: Secondary | ICD-10-CM | POA: Diagnosis present

## 2017-08-01 DIAGNOSIS — Z7189 Other specified counseling: Secondary | ICD-10-CM

## 2017-08-01 DIAGNOSIS — I10 Essential (primary) hypertension: Secondary | ICD-10-CM | POA: Diagnosis not present

## 2017-08-01 DIAGNOSIS — I700 Atherosclerosis of aorta: Secondary | ICD-10-CM | POA: Diagnosis not present

## 2017-08-01 DIAGNOSIS — G4733 Obstructive sleep apnea (adult) (pediatric): Secondary | ICD-10-CM | POA: Diagnosis not present

## 2017-08-01 DIAGNOSIS — Z79899 Other long term (current) drug therapy: Secondary | ICD-10-CM

## 2017-08-01 DIAGNOSIS — R0902 Hypoxemia: Secondary | ICD-10-CM

## 2017-08-01 DIAGNOSIS — I252 Old myocardial infarction: Secondary | ICD-10-CM

## 2017-08-01 DIAGNOSIS — J9621 Acute and chronic respiratory failure with hypoxia: Secondary | ICD-10-CM | POA: Diagnosis not present

## 2017-08-01 DIAGNOSIS — G8929 Other chronic pain: Secondary | ICD-10-CM | POA: Diagnosis present

## 2017-08-01 DIAGNOSIS — Z888 Allergy status to other drugs, medicaments and biological substances status: Secondary | ICD-10-CM

## 2017-08-01 DIAGNOSIS — F419 Anxiety disorder, unspecified: Secondary | ICD-10-CM | POA: Diagnosis not present

## 2017-08-01 DIAGNOSIS — L03115 Cellulitis of right lower limb: Secondary | ICD-10-CM | POA: Diagnosis not present

## 2017-08-01 DIAGNOSIS — I82890 Acute embolism and thrombosis of other specified veins: Secondary | ICD-10-CM | POA: Diagnosis not present

## 2017-08-01 DIAGNOSIS — L03116 Cellulitis of left lower limb: Secondary | ICD-10-CM | POA: Diagnosis not present

## 2017-08-01 DIAGNOSIS — I82431 Acute embolism and thrombosis of right popliteal vein: Secondary | ICD-10-CM | POA: Diagnosis not present

## 2017-08-01 DIAGNOSIS — Z7952 Long term (current) use of systemic steroids: Secondary | ICD-10-CM

## 2017-08-01 DIAGNOSIS — J45909 Unspecified asthma, uncomplicated: Secondary | ICD-10-CM | POA: Diagnosis present

## 2017-08-01 DIAGNOSIS — R079 Chest pain, unspecified: Secondary | ICD-10-CM | POA: Diagnosis not present

## 2017-08-01 DIAGNOSIS — K219 Gastro-esophageal reflux disease without esophagitis: Secondary | ICD-10-CM | POA: Diagnosis present

## 2017-08-01 DIAGNOSIS — Z9861 Coronary angioplasty status: Secondary | ICD-10-CM | POA: Diagnosis not present

## 2017-08-01 DIAGNOSIS — J439 Emphysema, unspecified: Secondary | ICD-10-CM | POA: Diagnosis not present

## 2017-08-01 DIAGNOSIS — J449 Chronic obstructive pulmonary disease, unspecified: Secondary | ICD-10-CM | POA: Diagnosis not present

## 2017-08-01 DIAGNOSIS — R0602 Shortness of breath: Secondary | ICD-10-CM | POA: Diagnosis not present

## 2017-08-01 DIAGNOSIS — I82412 Acute embolism and thrombosis of left femoral vein: Secondary | ICD-10-CM | POA: Diagnosis not present

## 2017-08-02 ENCOUNTER — Other Ambulatory Visit: Payer: Self-pay

## 2017-08-02 ENCOUNTER — Encounter (HOSPITAL_COMMUNITY): Payer: Self-pay

## 2017-08-02 ENCOUNTER — Inpatient Hospital Stay (HOSPITAL_COMMUNITY): Payer: Medicare Other

## 2017-08-02 DIAGNOSIS — I82411 Acute embolism and thrombosis of right femoral vein: Secondary | ICD-10-CM | POA: Diagnosis present

## 2017-08-02 DIAGNOSIS — Z7982 Long term (current) use of aspirin: Secondary | ICD-10-CM | POA: Diagnosis not present

## 2017-08-02 DIAGNOSIS — Z79899 Other long term (current) drug therapy: Secondary | ICD-10-CM | POA: Diagnosis not present

## 2017-08-02 DIAGNOSIS — F419 Anxiety disorder, unspecified: Secondary | ICD-10-CM | POA: Diagnosis present

## 2017-08-02 DIAGNOSIS — G4733 Obstructive sleep apnea (adult) (pediatric): Secondary | ICD-10-CM | POA: Diagnosis not present

## 2017-08-02 DIAGNOSIS — J9621 Acute and chronic respiratory failure with hypoxia: Secondary | ICD-10-CM | POA: Diagnosis present

## 2017-08-02 DIAGNOSIS — M549 Dorsalgia, unspecified: Secondary | ICD-10-CM | POA: Diagnosis present

## 2017-08-02 DIAGNOSIS — J45909 Unspecified asthma, uncomplicated: Secondary | ICD-10-CM | POA: Diagnosis present

## 2017-08-02 DIAGNOSIS — J432 Centrilobular emphysema: Secondary | ICD-10-CM | POA: Diagnosis not present

## 2017-08-02 DIAGNOSIS — E039 Hypothyroidism, unspecified: Secondary | ICD-10-CM | POA: Diagnosis present

## 2017-08-02 DIAGNOSIS — I2699 Other pulmonary embolism without acute cor pulmonale: Secondary | ICD-10-CM

## 2017-08-02 DIAGNOSIS — I10 Essential (primary) hypertension: Secondary | ICD-10-CM | POA: Diagnosis present

## 2017-08-02 DIAGNOSIS — G4730 Sleep apnea, unspecified: Secondary | ICD-10-CM | POA: Diagnosis not present

## 2017-08-02 DIAGNOSIS — Z6839 Body mass index (BMI) 39.0-39.9, adult: Secondary | ICD-10-CM | POA: Diagnosis not present

## 2017-08-02 DIAGNOSIS — I82441 Acute embolism and thrombosis of right tibial vein: Secondary | ICD-10-CM | POA: Diagnosis present

## 2017-08-02 DIAGNOSIS — L03116 Cellulitis of left lower limb: Secondary | ICD-10-CM | POA: Diagnosis present

## 2017-08-02 DIAGNOSIS — J9601 Acute respiratory failure with hypoxia: Secondary | ICD-10-CM | POA: Diagnosis not present

## 2017-08-02 DIAGNOSIS — Z888 Allergy status to other drugs, medicaments and biological substances status: Secondary | ICD-10-CM | POA: Diagnosis not present

## 2017-08-02 DIAGNOSIS — J449 Chronic obstructive pulmonary disease, unspecified: Secondary | ICD-10-CM | POA: Diagnosis not present

## 2017-08-02 DIAGNOSIS — Z79891 Long term (current) use of opiate analgesic: Secondary | ICD-10-CM | POA: Diagnosis not present

## 2017-08-02 DIAGNOSIS — G8929 Other chronic pain: Secondary | ICD-10-CM | POA: Diagnosis present

## 2017-08-02 DIAGNOSIS — Z96651 Presence of right artificial knee joint: Secondary | ICD-10-CM | POA: Diagnosis present

## 2017-08-02 DIAGNOSIS — Z87891 Personal history of nicotine dependence: Secondary | ICD-10-CM | POA: Diagnosis not present

## 2017-08-02 DIAGNOSIS — I252 Old myocardial infarction: Secondary | ICD-10-CM | POA: Diagnosis not present

## 2017-08-02 DIAGNOSIS — Z981 Arthrodesis status: Secondary | ICD-10-CM | POA: Diagnosis not present

## 2017-08-02 DIAGNOSIS — I2510 Atherosclerotic heart disease of native coronary artery without angina pectoris: Secondary | ICD-10-CM | POA: Diagnosis present

## 2017-08-02 DIAGNOSIS — Z7189 Other specified counseling: Secondary | ICD-10-CM | POA: Diagnosis not present

## 2017-08-02 DIAGNOSIS — R0902 Hypoxemia: Secondary | ICD-10-CM | POA: Diagnosis not present

## 2017-08-02 DIAGNOSIS — L03115 Cellulitis of right lower limb: Secondary | ICD-10-CM | POA: Diagnosis present

## 2017-08-02 DIAGNOSIS — I82431 Acute embolism and thrombosis of right popliteal vein: Secondary | ICD-10-CM | POA: Diagnosis present

## 2017-08-02 DIAGNOSIS — K219 Gastro-esophageal reflux disease without esophagitis: Secondary | ICD-10-CM | POA: Diagnosis present

## 2017-08-02 LAB — CBC
HCT: 48.1 % (ref 39.0–52.0)
HCT: 47.7 % (ref 39.0–52.0)
Hemoglobin: 15.9 g/dL (ref 13.0–17.0)
Hemoglobin: 15.6 g/dL (ref 13.0–17.0)
MCH: 32.1 pg (ref 26.0–34.0)
MCH: 31.8 pg (ref 26.0–34.0)
MCHC: 33.1 g/dL (ref 30.0–36.0)
MCHC: 32.7 g/dL (ref 30.0–36.0)
MCV: 97 fL (ref 78.0–100.0)
MCV: 97.1 fL (ref 78.0–100.0)
Platelets: 240 10*3/uL (ref 150–400)
Platelets: 201 10*3/uL (ref 150–400)
RBC: 4.96 MIL/uL (ref 4.22–5.81)
RBC: 4.91 MIL/uL (ref 4.22–5.81)
RDW: 13.2 % (ref 11.5–15.5)
RDW: 13.3 % (ref 11.5–15.5)
WBC: 10.6 10*3/uL — ABNORMAL HIGH (ref 4.0–10.5)
WBC: 10.4 10*3/uL (ref 4.0–10.5)

## 2017-08-02 LAB — HEPARIN LEVEL (UNFRACTIONATED)
Heparin Unfractionated: 0.4 IU/mL (ref 0.30–0.70)
Heparin Unfractionated: 0.36 IU/mL (ref 0.30–0.70)

## 2017-08-02 LAB — BASIC METABOLIC PANEL
Anion gap: 10 (ref 5–15)
BUN: 17 mg/dL (ref 8–23)
CO2: 28 mmol/L (ref 22–32)
Calcium: 8.9 mg/dL (ref 8.9–10.3)
Chloride: 104 mmol/L (ref 98–111)
Creatinine, Ser: 1.01 mg/dL (ref 0.61–1.24)
GFR calc Af Amer: >60 mL/min (ref >60)
GFR calc non Af Amer: >60 mL/min (ref >60)
Glucose, Bld: 96 mg/dL (ref 70–99)
Potassium: 4 mmol/L (ref 3.5–5.1)
Sodium: 142 mmol/L (ref 135–145)

## 2017-08-02 LAB — TROPONIN I
Troponin I: 0.03 ng/mL (ref <0.03)
Troponin I: <0.03 ng/mL (ref <0.03)

## 2017-08-02 MED ORDER — MAGNESIUM CITRATE PO SOLN: 1.0000 | Freq: Once | ORAL | Status: DC | PRN

## 2017-08-02 MED ORDER — DICYCLOMINE HCL 10 MG PO CAPS: 10.0000 mg | ORAL_CAPSULE | Freq: Four times a day (QID) | ORAL | Status: DC | PRN

## 2017-08-02 MED ORDER — OXYCODONE HCL ER 10 MG PO T12A
10.0000 mg | EXTENDED_RELEASE_TABLET | Freq: Two times a day (BID) | ORAL | Status: DC
Start: 2017-08-02 — End: 2017-08-02

## 2017-08-02 MED ORDER — LEVOTHYROXINE SODIUM 100 MCG PO TABS
200.0000 ug | ORAL_TABLET | Freq: Every day | ORAL | Status: DC
  Administered 2017-08-02 – 2017-08-07 (×6): 200 ug via ORAL
  Filled 2017-08-02 (×6): qty 2

## 2017-08-02 MED ORDER — ENOXAPARIN SODIUM 150 MG/ML ~~LOC~~ SOLN: 1.0000 mg/kg | Freq: Two times a day (BID) | SUBCUTANEOUS | Status: DC

## 2017-08-02 MED ORDER — IPRATROPIUM-ALBUTEROL 0.5-2.5 (3) MG/3ML IN SOLN
3.0000 mL | Freq: Four times a day (QID) | RESPIRATORY_TRACT | Status: DC | PRN
Start: 2017-08-02 — End: 2017-08-07

## 2017-08-02 MED ORDER — METHOCARBAMOL 500 MG PO TABS: 500.0000 mg | ORAL_TABLET | Freq: Four times a day (QID) | ORAL | Status: DC | PRN

## 2017-08-02 MED ORDER — ASPIRIN 325 MG PO TABS
325.0000 mg | ORAL_TABLET | Freq: Every day | ORAL | Status: DC
  Administered 2017-08-02 – 2017-08-07 (×6): 325 mg via ORAL
  Filled 2017-08-02 (×6): qty 1

## 2017-08-02 MED ORDER — BISACODYL 5 MG PO TBEC: 5.0000 mg | DELAYED_RELEASE_TABLET | Freq: Every day | ORAL | Status: DC | PRN

## 2017-08-02 MED ORDER — ONDANSETRON HCL 4 MG PO TABS: 4.0000 mg | ORAL_TABLET | Freq: Four times a day (QID) | ORAL | Status: DC | PRN

## 2017-08-02 MED ORDER — ENSURE ENLIVE PO LIQD
237.0000 mL | Freq: Two times a day (BID) | ORAL | Status: DC
  Administered 2017-08-02 – 2017-08-07 (×7): 237 mL via ORAL

## 2017-08-02 MED ORDER — LATANOPROST 0.005 % OP SOLN
1.0000 [drp] | Freq: Every day | OPHTHALMIC | Status: DC
  Administered 2017-08-02 – 2017-08-06 (×6): 1 [drp] via OPHTHALMIC
  Filled 2017-08-02: qty 2.5

## 2017-08-02 MED ORDER — ACETAMINOPHEN 325 MG PO TABS
650.0000 mg | ORAL_TABLET | Freq: Four times a day (QID) | ORAL | Status: DC | PRN
  Filled 2017-08-02: qty 2

## 2017-08-02 MED ORDER — ONDANSETRON HCL 4 MG/2ML IJ SOLN: 4.0000 mg | Freq: Four times a day (QID) | INTRAMUSCULAR | Status: DC | PRN

## 2017-08-02 MED ORDER — ALBUTEROL SULFATE HFA 108 (90 BASE) MCG/ACT IN AERS: 2.0000 | INHALATION_SPRAY | Freq: Four times a day (QID) | RESPIRATORY_TRACT | Status: DC | PRN

## 2017-08-02 MED ORDER — DIPHENHYDRAMINE HCL 25 MG PO CAPS: 25.0000 mg | ORAL_CAPSULE | Freq: Four times a day (QID) | ORAL | Status: DC | PRN

## 2017-08-02 MED ORDER — OXYCODONE HCL 5 MG PO TABS
5.0000 mg | ORAL_TABLET | ORAL | Status: DC | PRN
  Filled 2017-08-02: qty 2

## 2017-08-02 MED ORDER — METOPROLOL SUCCINATE ER 100 MG PO TB24
100.0000 mg | ORAL_TABLET | Freq: Every day | ORAL | Status: DC
  Administered 2017-08-03 – 2017-08-07 (×5): 100 mg via ORAL
  Filled 2017-08-02 (×5): qty 1

## 2017-08-02 MED ORDER — TRIAZOLAM 0.125 MG PO TABS
0.2500 mg | ORAL_TABLET | Freq: Every evening | ORAL | Status: DC | PRN
  Administered 2017-08-02 – 2017-08-06 (×6): 0.25 mg via ORAL
  Filled 2017-08-02 (×6): qty 2

## 2017-08-02 MED ORDER — HEPARIN (PORCINE) IN NACL 100-0.45 UNIT/ML-% IJ SOLN
1600.0000 [IU]/h | INTRAMUSCULAR | Status: DC
  Administered 2017-08-02: 1400 [IU]/h via INTRAVENOUS
  Administered 2017-08-03 – 2017-08-04 (×3): 1500 [IU]/h via INTRAVENOUS
  Filled 2017-08-02 (×5): qty 250

## 2017-08-02 MED ORDER — LISINOPRIL-HYDROCHLOROTHIAZIDE 20-12.5 MG PO TABS: 1.0000 | ORAL_TABLET | Freq: Every day | ORAL | Status: DC

## 2017-08-02 MED ORDER — ALPRAZOLAM 0.5 MG PO TABS: 0.5000 mg | ORAL_TABLET | Freq: Three times a day (TID) | ORAL | Status: DC | PRN

## 2017-08-02 MED ORDER — ADULT MULTIVITAMIN W/MINERALS CH
1.0000 | ORAL_TABLET | Freq: Every day | ORAL | Status: DC
  Administered 2017-08-02 – 2017-08-05 (×3): 1 via ORAL
  Filled 2017-08-02 (×5): qty 1

## 2017-08-02 MED ORDER — VANCOMYCIN HCL IN DEXTROSE 750-5 MG/150ML-% IV SOLN
750.0000 mg | Freq: Two times a day (BID) | INTRAVENOUS | Status: DC
  Administered 2017-08-02 – 2017-08-07 (×9): 750 mg via INTRAVENOUS
  Filled 2017-08-02 (×10): qty 150

## 2017-08-02 MED ORDER — PANTOPRAZOLE SODIUM 40 MG PO TBEC
40.0000 mg | DELAYED_RELEASE_TABLET | Freq: Every day | ORAL | Status: DC
  Administered 2017-08-02 – 2017-08-07 (×6): 40 mg via ORAL
  Filled 2017-08-02 (×6): qty 1

## 2017-08-02 MED ORDER — ALBUTEROL SULFATE (2.5 MG/3ML) 0.083% IN NEBU
2.5000 mg | INHALATION_SOLUTION | Freq: Four times a day (QID) | RESPIRATORY_TRACT | Status: DC | PRN
Start: 2017-08-02 — End: 2017-08-07

## 2017-08-02 MED ORDER — VANCOMYCIN HCL 10 G IV SOLR
2500.0000 mg | Freq: Once | INTRAVENOUS | Status: AC
  Administered 2017-08-02: 2500 mg via INTRAVENOUS
  Filled 2017-08-02: qty 2500

## 2017-08-02 MED ORDER — ACETAMINOPHEN 650 MG RE SUPP: 650.0000 mg | Freq: Four times a day (QID) | RECTAL | Status: DC | PRN

## 2017-08-02 MED ORDER — VANCOMYCIN HCL IN DEXTROSE 750-5 MG/150ML-% IV SOLN: 750.0000 mg | Freq: Two times a day (BID) | INTRAVENOUS | Status: DC

## 2017-08-02 MED ORDER — HYDROCODONE-ACETAMINOPHEN 5-325 MG PO TABS
1.0000 | ORAL_TABLET | Freq: Four times a day (QID) | ORAL | Status: DC | PRN
  Administered 2017-08-02 – 2017-08-06 (×6): 1 via ORAL
  Filled 2017-08-02 (×6): qty 1

## 2017-08-02 MED ORDER — SENNOSIDES-DOCUSATE SODIUM 8.6-50 MG PO TABS: 1.0000 | ORAL_TABLET | Freq: Every evening | ORAL | Status: DC | PRN

## 2017-08-02 MED ORDER — LORATADINE 10 MG PO TABS: 10.0000 mg | ORAL_TABLET | Freq: Every day | ORAL | Status: DC | PRN

## 2017-08-02 NOTE — H&P (Signed)
History and Physical   [facility] [place] @  Admission History and Physical [employer], D.O.    Patient Name: Glenn Beck MR#: [id number] Date of Birth: [birth year] Date of Admission: 08/01/2017  Referring MD/NP/PA: [facility] Primary Care Physician: Cher Nakai, MD  Chief Complaint: No chief complaint on file.   HPI: Glenn Beck is a 72 y.o. male with a known history of CAD status post MI, GERD, anxiety and arthritis, asthma, bronchitis, hypertension hypothyroidism presented to the emergency department at [facility] with complaint of chest pressure and shortness of breath and dyspnea on exertion for the past 5 days.  He has been taking Augmentin, doxycycline and prednisone for lower extremely cellulitis for the past 10 days..  In the emergency department he was found to have a significant pulmonary embolism.   Of note he has recently been prescribed testosterone gel topically.  Otherwise there has been no change in status. Patient has been taking medication as prescribed and there has been no recent change in medication or diet.  There has been no recent illness, hospitalizations, travel or sick contacts.    EMS/ED Course: Patient was found a large pulmonary embolism for which he received heparin. Medical admission has been requested for further management of pulmonary embolism.  Review of Systems:  CONSTITUTIONAL: No fever/chills, fatigue, weakness, weight gain/loss, headache. EYES: No blurry or double vision. ENT: No tinnitus, postnasal drip, redness or soreness of the oropharynx. RESPIRATORY: Positive cough, dyspnea, wheeze.  No hemoptysis.  CARDIOVASCULAR: Positive chest pain, negative palpitations, syncope, orthopnea. Positive lower extremity edema.  GASTROINTESTINAL: No nausea, vomiting, abdominal pain, diarrhea, constipation.  No hematemesis, melena or hematochezia. GENITOURINARY: No dysuria, frequency, hematuria. ENDOCRINE:  No polyuria or nocturia. No heat or cold intolerance. HEMATOLOGY: No anemia, bruising, bleeding. INTEGUMENTARY: No rashes, ulcers, lesions. MUSCULOSKELETAL: No arthritis, gout, dyspnea. NEUROLOGIC: No numbness, tingling, ataxia, seizure-type activity, weakness. PSYCHIATRIC: No anxiety, depression, insomnia.   Past Medical History:  Diagnosis Date  . Anxiety    takes Xanax daily as needed  . Arthritis    "all over my body"  . Asthma    bronchial  . Chronic back pain   . Complication of anesthesia    pt. reports swallowing has been difficult since having ACDF- 2016, has been eval. by ENT & testing done at [facility].   . Diverticulosis    takes Bentyl daily as needed  . GERD (gastroesophageal reflux disease)    takes Nexium daily as needed  . Glaucoma    uses eye drops  . Gout    on study medications for this  . Headache    r/t neck issues  . History of colon polyps    benign  . History of shingles   . Hypertension    takes Lisinopril and Metoprolol daily  . Hypothyroidism    takes Synthroid daily  . Insomnia    takes Triazolam nightly as needed  . Joint pain   . Kidney infection    beint treated at present with Amoxicillin   . Myocardial infarction >49yrs ago  . Pneumonia 62yrs ago  . Shortness of breath dyspnea    with exertion  . Swallowing difficulty    has been evaluated at [facility]: [facility] for this problem since Cervical Fusion    Past Surgical History:  Procedure Laterality Date  . ANTERIOR CERVICAL DECOMP/DISCECTOMY FUSION N/A 04/19/2014   Procedure: ANTERIOR CERVICAL DECOMPRESSION/DISCECTOMY FUSION CERVICAL THREE-FOUR CERVICAL FOUR-FIVE ,CERVICAL FIVE-SIX WITH INTERBODY PROTHESIS PLATING BONEGRAFT;  Surgeon: Consuella Lose, MD;  Location: [place];  Service: Neurosurgery;  Laterality: N/A;  . BACK SURGERY     x 2;fusion  . CARDIAC CATHETERIZATION  >76yrs ago   /w angioplasty  . COLONOSCOPY    . ESOPHAGOGASTRODUODENOSCOPY    . EYE SURGERY  Bilateral   . HERNIA REPAIR Right    many yrs. ago   . KNEE ARTHROSCOPY Right   . TONSILLECTOMY    . TOTAL KNEE ARTHROPLASTY Right 12/26/2014   Procedure: TOTAL KNEE ARTHROPLASTY;  Surgeon: Vickey Huger, MD;  Location: [place];  Service: Orthopedics;  Laterality: Right;     reports that he has quit smoking. He does not have any smokeless tobacco history on file. He reports that he does not drink alcohol or use drugs.  Allergies  Allergen Reactions  . Nasacort [Triamcinolone] Hives  . Other Rash    Reaction to unknown depression medication   Family History significant for mother with diabetes, MI in her 19s, father died at 65 during heart surgery, sister with uterine cancer in multiple family members hypertension  Prior to Admission medications   Medication Sig Start Date End Date Taking? Authorizing Provider  albuterol (PROVENTIL HFA;VENTOLIN HFA) 108 (90 BASE) MCG/ACT inhaler Inhale 2 puffs into the lungs every 6 (six) hours as needed for wheezing or shortness of breath.    [provider]  albuterol (PROVENTIL) (2.5 MG/3ML) 0.083% nebulizer solution Take 2.5 mg by nebulization every 6 (six) hours as needed for wheezing or shortness of breath.    [provider]  ALPRAZolam Duanne Moron) 0.5 MG tablet Take 0.5 mg by mouth every 8 (eight) hours as needed for anxiety.    [provider]  aspirin 325 MG tablet Take 325 mg by mouth daily.    [provider]  dicyclomine (BENTYL) 10 MG capsule Take 10 mg by mouth 4 (four) times daily as needed (cramping).    [provider]  enoxaparin (LOVENOX) 40 MG/0.4ML injection Inject 0.4 mLs (40 mg total) into the skin daily. 12/28/14   Carlynn Spry, PA-C  esomeprazole (NEXIUM) 20 MG capsule Take 20 mg by mouth daily as needed.    [provider]  HYDROcodone-acetaminophen (NORCO/VICODIN) 5-325 MG tablet Take 1 tablet by mouth every 6 (six) hours as needed for moderate pain.    [provider]   latanoprost (XALATAN) 0.005 % ophthalmic solution Place 1 drop into Beck eyes at bedtime.    [provider]  levothyroxine (SYNTHROID, LEVOTHROID) 200 MCG tablet Take 200 mcg by mouth daily before breakfast.    [provider]  lisinopril-hydrochlorothiazide (PRINZIDE,ZESTORETIC) 20-12.5 MG per tablet Take 1 tablet by mouth daily.    [provider]  loratadine (CLARITIN) 10 MG tablet Take 10 mg by mouth daily as needed for allergies.    [provider]  methocarbamol (ROBAXIN) 500 MG tablet Take 1-2 tablets (500-1,000 mg total) by mouth every 6 (six) hours as needed for muscle spasms. 12/28/14   Carlynn Spry, PA-C  metoprolol succinate (TOPROL-XL) 50 MG 24 hr tablet Take 50 mg by mouth daily. Take with or immediately following a meal.    [provider]  oxyCODONE (OXY IR/ROXICODONE) 5 MG immediate release tablet Take 1-2 tablets (5-10 mg total) by mouth every 3 (three) hours as needed for breakthrough pain. 12/28/14   Carlynn Spry, PA-C  oxyCODONE (OXYCONTIN) 10 mg 12 hr tablet Take 1 tablet (10 mg total) by mouth every 12 (twelve) hours. 12/28/14   Carlynn Spry, PA-C  PRESCRIPTION MEDICATION Take 3 tablets by mouth daily. Gout study TMX-67_301(administered by Dr. [facility] [phone]) started 03/22/14 - pt takes 3 tablets each morning 1 each from 3 different bottles: 1st and 2nd bottles Febuxostat, Allopurinol or Placebo; 3rd bottle colchicine 0.6 mg    [provider]  triazolam (HALCION) 0.25 MG tablet Take 0.25 mg by mouth at bedtime as needed for sleep.    [provider]    Physical Exam: Vitals:   08/01/17 2300  BP: 100/90  Pulse: 73  Resp: (!) 24  Temp: 97.7 F (36.5 C)  TempSrc: Oral  Weight: 125 kg (275 lb 8 oz)    GENERAL: 72 y.o.-year-old male patient, well-developed, well-nourished lying in the bed in no acute distress.  Pleasant and cooperative.   HEENT: Head atraumatic, normocephalic. Pupils  equal. Mucus membranes moist. NECK: Supple. No JVD. CHEST: Normal breath sounds bilaterally. No wheezing, rales, rhonchi or crackles. No use of accessory muscles of respiration.  No reproducible chest wall tenderness.  CARDIOVASCULAR: S1, S2 normal. No murmurs, rubs, or gallops. Cap refill <2 seconds. Pulses intact distally.  ABDOMEN: Soft, nondistended, nontender. No rebound, guarding, rigidity. Normoactive bowel sounds present in all four quadrants.  EXTREMITIES: Positive bilateral lower extremity edema with redness and tenderness. NEUROLOGIC: The patient is alert and oriented x 3. Cranial nerves II through XII are grossly intact with no focal sensorimotor deficit. PSYCHIATRIC:  Normal affect, mood, thought content. SKIN: Warm, dry, and intact without obvious rash, lesion, or ulcer.    Labs on Admission:  CBC: No results for input(s): WBC, NEUTROABS, HGB, HCT, MCV, PLT in the last 168 hours. Basic Metabolic Panel: No results for input(s): NA, K, CL, CO2, GLUCOSE, BUN, CREATININE, CALCIUM, MG, PHOS in the last 168 hours. GFR: CrCl cannot be calculated (Patient's most recent lab result is older than the maximum 21 days allowed.). Liver Function Tests: No results for input(s): AST, ALT, ALKPHOS, BILITOT, PROT, ALBUMIN in the last 168 hours. No results for input(s): LIPASE, AMYLASE in the last 168 hours. No results for input(s): AMMONIA in the last 168 hours. Coagulation Profile: No results for input(s): INR, PROTIME in the last 168 hours. Cardiac Enzymes: No results for input(s): CKTOTAL, CKMB, CKMBINDEX, TROPONINI in the last 168 hours. BNP (last 3 results) No results for input(s): PROBNP in the last 8760 hours. HbA1C: No results for input(s): HGBA1C in the last 72 hours. CBG: No results for input(s): GLUCAP in the last 168 hours. Lipid Profile: No results for input(s): CHOL, HDL, LDLCALC, TRIG, CHOLHDL, LDLDIRECT in the last 72 hours. Thyroid Function Tests: No results for  input(s): TSH, T4TOTAL, FREET4, T3FREE, THYROIDAB in the last 72 hours. Anemia Panel: No results for input(s): VITAMINB12, FOLATE, FERRITIN, TIBC, IRON, RETICCTPCT in the last 72 hours. Urine analysis:    Component Value Date/Time   COLORURINE YELLOW 12/16/2014 [place] 12/16/2014 1224   LABSPEC 1.011 12/16/2014 1224   PHURINE 7.5 12/16/2014 1224   GLUCOSEU NEGATIVE 12/16/2014 1224   HGBUR NEGATIVE 12/16/2014 1224   BILIRUBINUR NEGATIVE 12/16/2014 1224   KETONESUR NEGATIVE 12/16/2014 1224   PROTEINUR NEGATIVE 12/16/2014 1224   NITRITE NEGATIVE 12/16/2014 1224   LEUKOCYTESUR SMALL (A) 12/16/2014 1224   Sepsis Labs: @LABRCNTIP (procalcitonin:4,lacticidven:4) )No results found for this or any previous visit (from the past 240 hour(s)).   Lab results include a sodium of 139 potassium 4.2 chloride 99 CO2 31 glucose 103 BUN 16 creatinine 0.9 troponin 0 0.06 LFTs are within normal limits INR  1.2 BC 10.4 white count hemoglobin 16.7 hematocrit 48.8 platelets 222 BNP 517  CT angios revealed large right-sided pulmonary embolus extending from the right pulmonary artery into all lobar branches and multiple segmental branches within each lobe without CT evidence of right heart strain.  No evidence of pulmonary infarction.  COPD  Radiological Exams on Admission: No results found.  EKG: NSR  Assessment/Plan  This is a 72 y.o. male with a history of CAD status post MI, GERD, anxiety and arthritis, asthma, bronchitis, hypertension hypothyroidism now being admitted with:  #. Pulmonary Embolism - Admit obs - Continue IV heparin - Check bilateral LE dopplers (does not appear to have been done at [facility]) - Consider PCCM  #.  History of CAD Continue aspirin  #.  COPD Continue nebulizers, O2 as needed Continue Claritin  #. History of GERD - Continue Nexium  #. History of chronic pain - Continue OxyContin, Robaxin  #. History of retention - Continue lisinopril,  hydrochlorthiazide  #. History of hypothyroidism - Continue Synthroid  Admission status: Observation IV Fluids: Hep-Lock Diet/Nutrition: Heart healthy Consults called: None DVT Px: Heparin, SCDs and early ambulation. Code Status: Full Code  Disposition Plan: To home in 1-2 days  All the records are reviewed and case discussed with ED provider. Management plans discussed with the patient and/or family who express understanding and agree with plan of care.  Tashima Scarpulla D.O. on 08/02/2017 at 12:15 AM CC: Primary care physician; Cher Nakai, MD   08/02/2017, 12:15 AM

## 2017-08-02 NOTE — Progress Notes (Signed)
Patient with c/o of itching to back of head and neck. Vanc IV infusing at time of complaint. IV stopped, MD called and made aware. Will cont. To monitor.

## 2017-08-02 NOTE — Progress Notes (Signed)
Pharmacy Antibiotic Note  TEVYN CODD is a 72 y.o. male admitted on 08/01/2017 with cellulitis.  Pharmacy has been consulted for vancomycin dosing after treatment failure of home Augmentin and doxycycline.  Plan: Vancomycin 2500mg  IV x1 Vancomycin 750 IV every 12 hours.  Goal trough 10-15 mcg/mL.  Height: 5\' 10"  (177.8 cm) Weight: 275 lb 8 oz (125 kg) IBW/kg (Calculated) : 73  Temp (24hrs), Avg:97.9 F (36.6 C), Min:97.7 F (36.5 C), Max:98 F (36.7 C)  Recent Labs  Lab 08/02/17 0219 08/02/17 0557  WBC 10.6* 10.4  CREATININE 1.01  --     Estimated Creatinine Clearance: 85.1 mL/min (by C-G formula based on SCr of 1.01 mg/dL).    Allergies  Allergen Reactions  . Nasacort [Triamcinolone] Hives  . Other Rash    Reaction to unknown depression medication    Antimicrobials this admission: 7/20 Vancomycin >>   Dose adjustments this admission: none    Thank you for allowing pharmacy to be a part of this patient's care.  Harrietta Guardian, PharmD PGY1 Pharmacy Resident 08/02/2017    8:35 AM

## 2017-08-02 NOTE — Plan of Care (Signed)
Discussed plan of care with patient.  Patient had concerns about possible surgery, but MD stated surgery was not necessary at this time.  Good teach back displayed.

## 2017-08-02 NOTE — Progress Notes (Addendum)
[place] for Heparin  Indication: pulmonary embolus  Allergies  Allergen Reactions  . Nasacort [Triamcinolone] Hives  . Novocain [Procaine] Rash  . Other Rash    Reaction to unknown depression medication   Patient Measurements: Height: 5\' 10"  (177.8 cm) Weight: 275 lb 9.2 oz (125 kg) IBW/kg (Calculated) : 73  Vital Signs: Temp: 97.4 F (36.3 C) (07/20 1208) Temp Source: Oral (07/20 1208) BP: 126/82 (07/20 1208) Pulse Rate: 69 (07/20 0913)  Medical History: Past Medical History:  Diagnosis Date  . Anxiety    takes Xanax daily as needed  . Arthritis    "all over my body"  . Asthma    bronchial  . Chronic back pain   . Complication of anesthesia    pt. reports swallowing has been difficult since having ACDF- 2016, has been eval. by ENT & testing done at [facility].   . Diverticulosis    takes Bentyl daily as needed  . GERD (gastroesophageal reflux disease)    takes Nexium daily as needed  . Glaucoma    uses eye drops  . Gout    on study medications for this  . Headache    r/t neck issues  . History of colon polyps    benign  . History of shingles   . Hypertension    takes Lisinopril and Metoprolol daily  . Hypothyroidism    takes Synthroid daily  . Insomnia    takes Triazolam nightly as needed  . Joint pain   . Kidney infection    beint treated at present with Amoxicillin   . Myocardial infarction ([place]) >54yrs ago  . Pneumonia 28yrs ago  . Shortness of breath dyspnea    with exertion  . Swallowing difficulty    has been evaluated at [facility]. for this problem since Cervical Fusion   Assessment: 72 y/o M transfer from [facility] with new onset pulmonary embolus, transferred with heparin infusing at 1400 units/hr, received 5000 unit heparin bolus at [place].  Heparin level therapeutic on low end of goal range at 0.36, so will increase gtt slightly. CBC WNL, Scr 1.01. One episode of hemoptysis  noted.   Goal of Therapy:  Heparin level 0.3-0.7 units/ml Monitor platelets by anticoagulation protocol: Yes   Plan:  Increase heparin gtt to 1500 units/hr Daily HL, CBC, monitor s/s bleeding  Harrietta Guardian, PharmD PGY1 Pharmacy Resident 08/02/2017    3:44 PM

## 2017-08-02 NOTE — Progress Notes (Addendum)
PROGRESS NOTE    Glenn Beck  GGY:[id number] DOB: 06/26/1945 DOA: 08/01/2017 PCP: Cher Nakai, MD    Brief Narrative: Glenn Beck is a 72 y.o. male with a known history of CAD status post MI, GERD, anxiety and arthritis, asthma, bronchitis, hypertension hypothyroidism presented to the emergency department at [facility] with complaint of chest pressure and shortness of breath and dyspnea on exertion for the past 5 days.  He has been taking Augmentin, doxycycline and prednisone for lower extremely cellulitis for the past 10 days..  In the emergency department he was found to have a significant pulmonary embolism.   Of note he has recently been prescribed testosterone gel topically.  Otherwise there has been no change in status. Patient has been taking medication as prescribed and there has been no recent change in medication or diet.  There has been no recent illness, hospitalizations, travel or sick contacts.    EMS/ED Course: Patient was found a large pulmonary embolism for which he received heparin. Medical admission has been requested for further management of pulmonary embolism.    Assessment & Plan:   Active Problems:   Pulmonary embolism (HCC)  1-Pulmonary embolism; large right side. Acute hypoxic Respiratory Failure.  Continue with heparin gtt.  Will check ECHO and doppler LE.  CT angio outside facility: Large right side pulmonary embolus extending from the right pulmonary artery into all lobar branches and multiple segmental branches within each lobe. No CT evidence of right heart strain.  Admit to step down, motor Blood pressure.  He report episode of hemoptysis. Will monitor closely.  Continue with heparin Gtt for at least 48 hours.  Will consult Pulmonary. Discussed with CCM , follow ECHO and troponin, if right side strain consult them.,   2-Cellulitis LE, Bilateral.  Bilateral LE with redness, small opn wound left.  Start Vancomycin./ fail oral  antibiotics.  Check Doppler LE>    COPD;  Continue with nebulizer PRN.   History of CAD;  Continue with aspirin, metoprolol.   History on chronic pain;  On Vicodin  and robaxin.   HTN;  Continue with metoprolol with holder parameter for hypotension.   Hypothyroidism;  Continue with synthroid.      DVT prophylaxis: heparin  Code Status: full code.  Family Communication: care discussed with patient  Disposition Plan: to be determine  Consultants:      Procedures:   ECHO  Doppler LE   Antimicrobials:      Subjective: He report chest pressure has improved. He is breathing a little better.  He report hemoptysis, small amount one episode.   Objective: Vitals:   08/01/17 2300 08/02/17 0044  BP: 100/90   Pulse: 73   Resp: (!) 24   Temp: 97.7 F (36.5 C)   TempSrc: Oral   Weight: 125 kg (275 lb 8 oz)   Height:  5\' 10"  (1.778 m)    Intake/Output Summary (Last 24 hours) at 08/02/2017 0350 Last data filed at 08/02/2017 0420 Gross per 24 hour  Intake 28 ml  Output -  Net 28 ml   Filed Weights   08/01/17 2300  Weight: 125 kg (275 lb 8 oz)    Examination:  General exam: NAD,  Respiratory system: tachypnea, bilateral air movement, bilateral ronchus Cardiovascular system: S1 & S2 heard, RRR. No JVD, murmurs, rubs, gallops or clicks. No pedal edema. Gastrointestinal system: Abdomen is nondistended, soft and nontender. No organomegaly or masses felt. Normal bowel sounds heard. Central nervous system: Alert and oriented. No  focal neurological deficits. Extremities: Symmetric 5 x 5 power. Skin: No rashes, lesions or ulcers    Data Reviewed: I have personally reviewed following labs and imaging studies  CBC: Recent Labs  Lab 08/02/17 0219  WBC 10.6*  HGB 15.9  HCT 48.1  MCV 97.0  PLT 938   Basic Metabolic Panel: Recent Labs  Lab 08/02/17 0219  NA 142  K 4.0  CL 104  CO2 28  GLUCOSE 96  BUN 17  CREATININE 1.01  CALCIUM 8.9    GFR: Estimated Creatinine Clearance: 85.1 mL/min (by C-G formula based on SCr of 1.01 mg/dL). Liver Function Tests: No results for input(s): AST, ALT, ALKPHOS, BILITOT, PROT, ALBUMIN in the last 168 hours. No results for input(s): LIPASE, AMYLASE in the last 168 hours. No results for input(s): AMMONIA in the last 168 hours. Coagulation Profile: No results for input(s): INR, PROTIME in the last 168 hours. Cardiac Enzymes: No results for input(s): CKTOTAL, CKMB, CKMBINDEX, TROPONINI in the last 168 hours. BNP (last 3 results) No results for input(s): PROBNP in the last 8760 hours. HbA1C: No results for input(s): HGBA1C in the last 72 hours. CBG: No results for input(s): GLUCAP in the last 168 hours. Lipid Profile: No results for input(s): CHOL, HDL, LDLCALC, TRIG, CHOLHDL, LDLDIRECT in the last 72 hours. Thyroid Function Tests: No results for input(s): TSH, T4TOTAL, FREET4, T3FREE, THYROIDAB in the last 72 hours. Anemia Panel: No results for input(s): VITAMINB12, FOLATE, FERRITIN, TIBC, IRON, RETICCTPCT in the last 72 hours. Sepsis Labs: No results for input(s): PROCALCITON, LATICACIDVEN in the last 168 hours.  No results found for this or any previous visit (from the past 240 hour(s)).       Radiology Studies: No results found.      Scheduled Meds: . aspirin  325 mg Oral Daily  . feeding supplement (ENSURE ENLIVE)  237 mL Oral BID BM  . latanoprost  1 drop Both Eyes QHS  . levothyroxine  200 mcg Oral QAC breakfast  . metoprolol succinate  100 mg Oral Daily  . pantoprazole  40 mg Oral Daily   Continuous Infusions: . heparin 1,400 Units/hr (08/02/17 0420)     LOS: 1 day    Time spent: 35 minutes.     Elmarie Shiley, MD [facility] Pager [phone]  If 7PM-7AM, please contact night-coverage [email or web address] Password [email or web address] 08/02/2017, 7:02 AM

## 2017-08-02 NOTE — Progress Notes (Signed)
[place] for Heparin  Indication: pulmonary embolus  Allergies  Allergen Reactions  . Nasacort [Triamcinolone] Hives  . Other Rash    Reaction to unknown depression medication   Patient Measurements: Height: 5\' 10"  (177.8 cm) Weight: 275 lb 8 oz (125 kg) IBW/kg (Calculated) : 73  Vital Signs: Temp: 98 F (36.7 C) (07/20 0732) Temp Source: Oral (07/20 0732) BP: 118/76 (07/20 0732) Pulse Rate: 63 (07/20 0732)  Medical History: Past Medical History:  Diagnosis Date  . Anxiety    takes Xanax daily as needed  . Arthritis    "all over my body"  . Asthma    bronchial  . Chronic back pain   . Complication of anesthesia    pt. reports swallowing has been difficult since having ACDF- 2016, has been eval. by ENT & testing done at [facility].   . Diverticulosis    takes Bentyl daily as needed  . GERD (gastroesophageal reflux disease)    takes Nexium daily as needed  . Glaucoma    uses eye drops  . Gout    on study medications for this  . Headache    r/t neck issues  . History of colon polyps    benign  . History of shingles   . Hypertension    takes Lisinopril and Metoprolol daily  . Hypothyroidism    takes Synthroid daily  . Insomnia    takes Triazolam nightly as needed  . Joint pain   . Kidney infection    beint treated at present with Amoxicillin   . Myocardial infarction ([place]) >15yrs ago  . Pneumonia 2yrs ago  . Shortness of breath dyspnea    with exertion  . Swallowing difficulty    has been evaluated at [facility]. for this problem since Cervical Fusion   Assessment: 72 y/o M transfer from [facility] with new onset pulmonary embolus, transferred with heparin infusing at 1400 units/hr, received 5000 unit heparin bolus at [facility], CBC WNL, INR 1.2, Scr 0.9.   7/20 AM update: initial heparin level is therapeutic, Hgb stable  Goal of Therapy:  Heparin level 0.3-0.7 units/ml Monitor platelets by  anticoagulation protocol: Yes   Plan:  Cont heparin at 1400 units/hr 1400 confirmatory heparin level   [name], PharmD, BCPS Clinical Pharmacist Phone: [phone]

## 2017-08-02 NOTE — Progress Notes (Signed)
Initial Nutrition Assessment  DOCUMENTATION CODES:   Morbid obesity  INTERVENTION:   Ensure Enlive po BID, each supplement provides 350 kcal and 20 grams of protein  MVI daily  Liberalize diet   NUTRITION DIAGNOSIS:   Inadequate oral intake related to acute illness as evidenced by per patient/family report.  GOAL:   Patient will meet greater than or equal to 90% of their needs  MONITOR:   PO intake, Supplement acceptance, Labs, Weight trends, Skin, I & O's  REASON FOR ASSESSMENT:   Malnutrition Screening Tool    ASSESSMENT:   72 y.o. male with a known history of CAD status post MI, GERD, anxiety and arthritis, asthma, bronchitis, hypertension hypothyroidism presented to the emergency department at [facility] with complaint of chest pressure and shortness of breath and dyspnea on exertion    Met with pt in room today. Pt reports poor appetite and oral intake for several weeks pta. Pt reports his appetite has continued to be poor today; pt reports eating on bites of his breakfast. Per chart and pt report, pt's weight is stable. Pt is willing to drink chocolate Ensure. Pt reports difficulty swallowing with food getting stuck in his throat; pt prefers to have his meats come up chopped. RD will order MVI to help pt meet his estimated micronutrient needs. Recommend liberal diet to encourage po intake.   Medications reviewed and include: aspirin, synthroid, protonix, heparin, vancomycin  Labs reviewed:   NUTRITION - FOCUSED PHYSICAL EXAM:    Most Recent Value  Orbital Region  No depletion  Upper Arm Region  No depletion  Thoracic and Lumbar Region  No depletion  Buccal Region  No depletion  Temple Region  No depletion  Clavicle Bone Region  No depletion  Clavicle and Acromion Bone Region  No depletion  Scapular Bone Region  No depletion  Dorsal Hand  No depletion  Patellar Region  No depletion  Anterior Thigh Region  No depletion  Posterior Calf Region  No  depletion  Edema (RD Assessment)  None  Hair  Reviewed  Eyes  Reviewed  Mouth  Reviewed  Skin  Reviewed  Nails  Reviewed     Diet Order:   Diet Order           Diet Heart Room service appropriate? Yes; Fluid consistency: Thin  Diet effective now         EDUCATION NEEDS:   Education needs have been addressed  Skin:  Skin Assessment: Reviewed RN Assessment(cellulitis LE)  Last BM:  7/19  Height:   Ht Readings from Last 1 Encounters:  08/02/17 '5\' 10"'  (1.778 m)    Weight:   Wt Readings from Last 1 Encounters:  08/01/17 275 lb 8 oz (125 kg)    Ideal Body Weight:  75.4 kg  BMI:  Body mass index is 39.53 kg/m.  Estimated Nutritional Needs:   Kcal:  2300-2600kcal/day   Protein:  125-138g/day   Fluid:  >2.3L/day   Koleen Distance MS, RD, LDN Pager #- [phone] Office#- [phone] After Hours Pager: [phone]

## 2017-08-02 NOTE — Progress Notes (Addendum)
PT Cancellation Note  Patient Details Name: Glenn Beck MRN: [id number] DOB: 1945/12/29   Cancelled Treatment:    Reason Eval/Treat Not Completed: Patient not medically ready. Pt admitted early this morning with large PE, initiation of heparin has not been 24 hours yet. Per rehab protocol, will hold for today. PT will continue to follow acutely for evaluation.   [place] 08/02/2017, 9:15 AM

## 2017-08-02 NOTE — Progress Notes (Signed)
Bilateral lower extremity venous duplex has been completed. There is evidence of acute deep vein thrombosis involving the femoral, popliteal, and posterior tibial veins of the left lower extremity. Negative for DVT on the right. Results were given to the patient's nurse, Queta.  08/02/17 3:09 PM Glenn Beck RVT

## 2017-08-02 NOTE — Progress Notes (Signed)
ANTICOAGULATION CONSULT NOTE - Initial Consult  Pharmacy Consult for Heparin  Indication: pulmonary embolus  Allergies  Allergen Reactions  . Nasacort [Triamcinolone] Hives  . Other Rash    Reaction to unknown depression medication   Patient Measurements: Height: 5\' 10"  (177.8 cm) Weight: 275 lb 8 oz (125 kg) IBW/kg (Calculated) : 73  Vital Signs: Temp: 97.7 F (36.5 C) (07/19 2300) Temp Source: Oral (07/19 2300) BP: 100/90 (07/19 2300) Pulse Rate: 73 (07/19 2300)  Medical History: Past Medical History:  Diagnosis Date  . Anxiety    takes Xanax daily as needed  . Arthritis    "all over my body"  . Asthma    bronchial  . Chronic back pain   . Complication of anesthesia    pt. reports swallowing has been difficult since having ACDF- 2016, has been eval. by ENT & testing done at [facility].   . Diverticulosis    takes Bentyl daily as needed  . GERD (gastroesophageal reflux disease)    takes Nexium daily as needed  . Glaucoma    uses eye drops  . Gout    on study medications for this  . Headache    r/t neck issues  . History of colon polyps    benign  . History of shingles   . Hypertension    takes Lisinopril and Metoprolol daily  . Hypothyroidism    takes Synthroid daily  . Insomnia    takes Triazolam nightly as needed  . Joint pain   . Kidney infection    beint treated at present with Amoxicillin   . Myocardial infarction ([place]) >72yrs ago  . Pneumonia 37yrs ago  . Shortness of breath dyspnea    with exertion  . Swallowing difficulty    has been evaluated at [facility]. for this problem since Cervical Fusion   Assessment: 72 y/o M transfer from [facility] with new onset pulmonary embolus, transferred with heparin infusing at 1400 units/hr, received 5000 unit heparin bolus at [place], CBC WNL, INR 1.2, Scr 0.9.   Goal of Therapy:  Heparin level 0.3-0.7 units/ml Monitor platelets by anticoagulation protocol: Yes   Plan:  Cont heparin at  1400 units/hr 0500 HL Monitor for bleeding   [name] 08/02/2017,2:14 AM

## 2017-08-03 ENCOUNTER — Inpatient Hospital Stay (HOSPITAL_COMMUNITY): Payer: Medicare Other

## 2017-08-03 DIAGNOSIS — J9601 Acute respiratory failure with hypoxia: Secondary | ICD-10-CM

## 2017-08-03 LAB — CBC
HCT: 46.7 % (ref 39.0–52.0)
Hemoglobin: 14.9 g/dL (ref 13.0–17.0)
MCH: 31.4 pg (ref 26.0–34.0)
MCHC: 31.9 g/dL (ref 30.0–36.0)
MCV: 98.3 fL (ref 78.0–100.0)
Platelets: 189 10*3/uL (ref 150–400)
RBC: 4.75 MIL/uL (ref 4.22–5.81)
RDW: 13.3 % (ref 11.5–15.5)
WBC: 8.1 10*3/uL (ref 4.0–10.5)

## 2017-08-03 LAB — BASIC METABOLIC PANEL
Anion gap: 9 (ref 5–15)
BUN: 13 mg/dL (ref 8–23)
CO2: 27 mmol/L (ref 22–32)
Calcium: 8.3 mg/dL — ABNORMAL LOW (ref 8.9–10.3)
Chloride: 104 mmol/L (ref 98–111)
Creatinine, Ser: 1.01 mg/dL (ref 0.61–1.24)
GFR calc Af Amer: >60 mL/min (ref >60)
GFR calc non Af Amer: >60 mL/min (ref >60)
Glucose, Bld: 93 mg/dL (ref 70–99)
Potassium: 4 mmol/L (ref 3.5–5.1)
Sodium: 140 mmol/L (ref 135–145)

## 2017-08-03 LAB — HEPARIN LEVEL (UNFRACTIONATED): Heparin Unfractionated: 0.65 IU/mL (ref 0.30–0.70)

## 2017-08-03 LAB — ECHOCARDIOGRAM COMPLETE
Height: 70 in
Weight: 4409.2 oz

## 2017-08-03 LAB — TROPONIN I
Troponin I: <0.03 ng/mL (ref <0.03)
Troponin I: <0.03 ng/mL (ref <0.03)

## 2017-08-03 NOTE — Progress Notes (Signed)
[place] for Heparin  Indication: pulmonary embolus/ DVT  Allergies  Allergen Reactions  . Nasacort [Triamcinolone] Hives  . Novocain [Procaine] Rash  . Other Rash    Reaction to unknown depression medication   Patient Measurements: Height: 5\' 10"  (177.8 cm) Weight: 275 lb 9.2 oz (125 kg) IBW/kg (Calculated) : 73  Vital Signs: Temp: 97.6 F (36.4 C) (07/21 0814) Temp Source: Oral (07/21 0814) BP: 141/78 (07/21 0814) Pulse Rate: 70 (07/21 0814)  Medical History: Past Medical History:  Diagnosis Date  . Anxiety    takes Xanax daily as needed  . Arthritis    "all over my body"  . Asthma    bronchial  . Chronic back pain   . Complication of anesthesia    pt. reports swallowing has been difficult since having ACDF- 2016, has been eval. by ENT & testing done at [facility].   . Diverticulosis    takes Bentyl daily as needed  . GERD (gastroesophageal reflux disease)    takes Nexium daily as needed  . Glaucoma    uses eye drops  . Gout    on study medications for this  . Headache    r/t neck issues  . History of colon polyps    benign  . History of shingles   . Hypertension    takes Lisinopril and Metoprolol daily  . Hypothyroidism    takes Synthroid daily  . Insomnia    takes Triazolam nightly as needed  . Joint pain   . Kidney infection    beint treated at present with Amoxicillin   . Myocardial infarction ([place]) >3yrs ago  . Pneumonia 28yrs ago  . Shortness of breath dyspnea    with exertion  . Swallowing difficulty    has been evaluated at [facility]. for this problem since Cervical Fusion   Assessment: 72 y/o M transfer from [facility] with new onset pulmonary embolus, transferred with heparin infusing at 1400 units/hr, received 5000 unit heparin bolus at [place].  Heparin level therapeutic at 0.65 units/ml. CBC WNL, Scr 1.01. One episode of pt reported hemoptysis noted on 7/20 am.   Goal of  Therapy:  Heparin level 0.3-0.7 units/ml Monitor platelets by anticoagulation protocol: Yes   Plan:  Continue heparin gtt 1500 units/hr Daily HL, monitor s/s bleeding  Harrietta Guardian, PharmD PGY1 Pharmacy Resident 08/03/2017    9:29 AM

## 2017-08-03 NOTE — Progress Notes (Signed)
PT Cancellation Note  Patient Details Name: Glenn Beck MRN: [id number] DOB: July 04, 1945   Cancelled Treatment:    Reason Eval/Treat Not Completed: Patient not medically ready (bedrest orders). Will follow.  Ellamae Sia, PT, DPT Acute Rehabilitation Services  Pager: [place] 08/03/2017, 8:43 AM

## 2017-08-03 NOTE — Progress Notes (Signed)
Benefit check for Eliquis and Xaralto submitted and CC'd to [employer]

## 2017-08-03 NOTE — Progress Notes (Signed)
*  PRELIMINARY RESULTS* Echocardiogram 2D Echocardiogram has been performed.  Leavy Cella 08/03/2017, 3:51 PM

## 2017-08-03 NOTE — Progress Notes (Signed)
PROGRESS NOTE    GAR GLANCE  POE:[id number] DOB: [birth year] DOA: 08/01/2017 PCP: Cher Nakai, MD    Brief Narrative: Glenn Beck is a 72 y.o. male with a known history of CAD status post MI, GERD, anxiety and arthritis, asthma, bronchitis, hypertension hypothyroidism presented to the emergency department at [facility], [place] with complaint of chest pressure and shortness of breath and dyspnea on exertion for the past 5 days.  He has been taking Augmentin, doxycycline and prednisone for lower extremely cellulitis for the past 10 days..  In the emergency department he was found to have a significant pulmonary embolism.   Of note he has recently been prescribed testosterone gel topically.  Otherwise there has been no change in status. Patient has been taking medication as prescribed and there has been no recent change in medication or diet.  There has been no recent illness, hospitalizations, travel or sick contacts.    EMS/ED Course: Patient was found a large pulmonary embolism for which he received heparin. Medical admission has been requested for further management of pulmonary embolism.    Assessment & Plan:   Active Problems:   Pulmonary embolism (HCC)  1-Pulmonary embolism; large right side. Acute hypoxic Respiratory Failure.  -Continue with heparin gtt.  -CT angio outside facility: Large right side pulmonary embolus extending from the right pulmonary artery into all lobar branches and multiple segmental branches within each lobe. No CT evidence of right heart strain.  -No further hemoptysis.  -Continue with heparin Gtt for at least 48 hours.  -Discussed with CCM , follow ECHO and troponin, if right side strain consult them.,  -troponin negative.  -Doppler L E; Positive for DVT right femoral, popliteal and tibial veins.   2-Cellulitis LE, Bilateral.  Bilateral LE with redness, small open wound left.  Continue with Vancomycin./ fail oral antibiotics.   3-DVT Right  lower extremity; femoral, popliteal and tibial vein;  Continue with heparin gtt   COPD;  Continue with nebulizer PRN.   History of CAD;  Continue with aspirin, metoprolol.   History on chronic pain;  On Vicodin  and robaxin.   HTN;  Continue with metoprolol with holder parameter for hypotension.   Hypothyroidism;  Continue with synthroid.      DVT prophylaxis: heparin  Code Status: full code.  Family Communication: care discussed with patient  Disposition Plan: to be determine  Consultants:      Procedures:   ECHO  Doppler LE   Antimicrobials:      Subjective: He is breathing better, denies chest pain.   Objective: Vitals:   08/02/17 1641 08/02/17 2300 08/03/17 0500 08/03/17 0814  BP: 116/74 113/75 134/89 (!) 141/78  Pulse:  66 66 70  Resp: 18 18 18  (!) 26  Temp: (!) 97.5 F (36.4 C) 98.1 F (36.7 C) 98 F (36.7 C) 97.6 F (36.4 C)  TempSrc: Oral Oral Oral Oral  SpO2: 100% 94% 98% 100%  Weight:      Height:        Intake/Output Summary (Last 24 hours) at 08/03/2017 0958 Last data filed at 08/03/2017 0700 Gross per 24 hour  Intake 150 ml  Output 750 ml  Net -600 ml   Filed Weights   08/01/17 2300 08/02/17 1400  Weight: 125 kg (275 lb 8 oz) 125 kg (275 lb 9.2 oz)    Examination:  General exam: NAD Respiratory system: Normal respiratory effort, ronchus right  Cardiovascular system: S 1, S 2 RRR Gastrointestinal system: BS present, soft, nt Central  nervous system: non focal.  Extremities: Symmetric 5 x 5 power. Skin: Bilateral LE with redness, warm, small open wound left     Data Reviewed: I have personally reviewed following labs and imaging studies  CBC: Recent Labs  Lab 08/02/17 0219 08/02/17 0557 08/03/17 0652  WBC 10.6* 10.4 8.1  HGB 15.9 15.6 14.9  HCT 48.1 47.7 46.7  MCV 97.0 97.1 98.3  PLT 240 201 128   Basic Metabolic Panel: Recent Labs  Lab 08/02/17 0219 08/03/17 0652  NA 142 140  K 4.0 4.0  CL 104 104    CO2 28 27  GLUCOSE 96 93  BUN 17 13  CREATININE 1.01 1.01  CALCIUM 8.9 8.3*   GFR: Estimated Creatinine Clearance: 85.1 mL/min (by C-G formula based on SCr of 1.01 mg/dL). Liver Function Tests: No results for input(s): AST, ALT, ALKPHOS, BILITOT, PROT, ALBUMIN in the last 168 hours. No results for input(s): LIPASE, AMYLASE in the last 168 hours. No results for input(s): AMMONIA in the last 168 hours. Coagulation Profile: No results for input(s): INR, PROTIME in the last 168 hours. Cardiac Enzymes: Recent Labs  Lab 08/02/17 0557 08/02/17 1735 08/03/17 0019 08/03/17 0652  TROPONINI 0.03* <0.03 <0.03 <0.03   BNP (last 3 results) No results for input(s): PROBNP in the last 8760 hours. HbA1C: No results for input(s): HGBA1C in the last 72 hours. CBG: No results for input(s): GLUCAP in the last 168 hours. Lipid Profile: No results for input(s): CHOL, HDL, LDLCALC, TRIG, CHOLHDL, LDLDIRECT in the last 72 hours. Thyroid Function Tests: No results for input(s): TSH, T4TOTAL, FREET4, T3FREE, THYROIDAB in the last 72 hours. Anemia Panel: No results for input(s): VITAMINB12, FOLATE, FERRITIN, TIBC, IRON, RETICCTPCT in the last 72 hours. Sepsis Labs: No results for input(s): PROCALCITON, LATICACIDVEN in the last 168 hours.  No results found for this or any previous visit (from the past 240 hour(s)).       Radiology Studies: No results found.      Scheduled Meds: . aspirin  325 mg Oral Daily  . feeding supplement (ENSURE ENLIVE)  237 mL Oral BID BM  . latanoprost  1 drop Both Eyes QHS  . levothyroxine  200 mcg Oral QAC breakfast  . metoprolol succinate  100 mg Oral Daily  . multivitamin with minerals  1 tablet Oral Daily  . pantoprazole  40 mg Oral Daily   Continuous Infusions: . heparin 1,500 Units/hr (08/03/17 0954)  . vancomycin 750 mg (08/03/17 0948)     LOS: 2 days    Time spent: 35 minutes.     Elmarie Shiley, MD [facility] Pager  [phone]  If 7PM-7AM, please contact night-coverage [email or web address] Password [facility] 08/03/2017, 9:58 AM

## 2017-08-04 LAB — CBC
HCT: 46.2 % (ref 39.0–52.0)
Hemoglobin: 14.8 g/dL (ref 13.0–17.0)
MCH: 31.6 pg (ref 26.0–34.0)
MCHC: 32 g/dL (ref 30.0–36.0)
MCV: 98.5 fL (ref 78.0–100.0)
Platelets: 199 10*3/uL (ref 150–400)
RBC: 4.69 MIL/uL (ref 4.22–5.81)
RDW: 13.1 % (ref 11.5–15.5)
WBC: 8.6 10*3/uL (ref 4.0–10.5)

## 2017-08-04 LAB — HEPARIN LEVEL (UNFRACTIONATED): Heparin Unfractionated: 0.46 IU/mL (ref 0.30–0.70)

## 2017-08-04 NOTE — Progress Notes (Signed)
PT Cancellation Note  Patient Details Name: KALEEM SARTWELL MRN: [id number] DOB: 03-20-1945   Cancelled Treatment:    Reason Eval/Treat Not Completed: (P) Medical issues which prohibited therapy Pt currently on bedrest. Spoke with nursing to have them removed.  PT will follow back this afternoon as able.  Cyan Moultrie B. Migdalia Dk PT, DPT Acute Rehabilitation  [phone] Pager [phone]     [place] 08/04/2017, 11:57 AM

## 2017-08-04 NOTE — Progress Notes (Signed)
#   1. [facility]  @ [place] RX # [phone]   9. XARELTO 15 MG BID  COVER - YES  CO-PAY- $ 45.00  TIER- 3 DRUG  PRIOR APPROVAL- NO   2. XARELTO 20 MG DAILY  COVER- YES  CO-PAY- $ 45.00  TIER- 3 DRUG  PRIOR APPROVAL- NO   ELIQUIS 10 MG - NONE FORMULARY   3. ELIQUIS 2.5 MG BID  COVER- YES  CO-PAY- $ 45.00  TIER- 3 DRUG  PRIOR APPROVAL- NO   4. ELIQUIS  5 MG BID  COVER - YES  CO-PAY- $ 45.00  TIER- 3 DRUG  PRIOR APPROVAL- NO   PREFERRED PHARMACY : NO- ANY RETAIL

## 2017-08-04 NOTE — Care Management Note (Addendum)
Case Management Note  Patient Details  Name: Glenn Beck MRN: [id number] Date of Birth: 05-May-1945  Subjective/Objective:    From home alone, presents with PE, and DVT right femoral, popliteal and tibial veins on heparin drip, awaiting benefit check for eliquis and xarelto.     7/24 Tomi Bamberger RN, BSN - NCM gave patient the 30 day free coupon card for eliquis and informed him of his co pay amt of 40.00 for refills.  He states MD said he will be dc home tomorrow, getting one more day of iv abx for cellulitis.               Action/Plan: DC home when ready.  Expected Discharge Date:  08/03/17               Expected Discharge Plan:  Home/Self Care  In-House Referral:     Discharge planning Services  CM Consult  Post Acute Care Choice:    Choice offered to:     DME Arranged:    DME Agency:     HH Arranged:    HH Agency:     Status of Service:  In process, will continue to follow  If discussed at Long Length of Stay Meetings, dates discussed:    Additional Comments:  Zenon Mayo, RN 08/04/2017, 7:58 AM

## 2017-08-04 NOTE — Progress Notes (Signed)
ANTICOAGULATION CONSULT NOTE - Follow Up Consult  Pharmacy Consult for Heparin Indication: pulmonary embolus  Allergies  Allergen Reactions  . Nasacort [Triamcinolone] Hives  . Novocain [Procaine] Rash  . Other Rash    Reaction to unknown depression medication    Patient Measurements: Height: 5\' 10"  (177.8 cm) Weight: 275 lb 9.2 oz (125 kg) IBW/kg (Calculated) : 73 Heparin Dosing Weight: 101 kg  Vital Signs: Temp: 97.6 F (36.4 C) (07/22 0847) Temp Source: Oral (07/22 0847) BP: 124/64 (07/22 0847) Pulse Rate: 69 (07/22 0847)  Labs: Recent Labs    08/02/17 0219  08/02/17 0557 08/02/17 1357 08/02/17 1735 08/03/17 0019 08/03/17 0652 08/03/17 0733 08/04/17 0317  HGB 15.9  --  15.6  --   --   --  14.9  --  14.8  HCT 48.1  --  47.7  --   --   --  46.7  --  46.2  PLT 240  --  201  --   --   --  189  --  199  HEPARINUNFRC  --    < > 0.40 0.36  --   --   --  0.65 0.46  CREATININE 1.01  --   --   --   --   --  1.01  --   --   TROPONINI  --    < > 0.03*  --  <0.03 <0.03 <0.03  --   --    < > = values in this interval not displayed.    Estimated Creatinine Clearance: 85.1 mL/min (by C-G formula based on SCr of 1.01 mg/dL).   Medications:  Infusions:  . heparin 1,500 Units/hr (08/04/17 0745)  . vancomycin 750 mg (08/04/17 0914)    Assessment: 72 year old male on anticoagulation with heparin for pulmonary embolism.  His heparin level remains therapeutic today.  His CBC is stable with no bleeding noted. Note anticipated transition to [place] therapy.  Goal of Therapy:  Heparin level 0.3-0.7 units/ml Monitor platelets by anticoagulation protocol: Yes   Plan:  Continue heparin at 1500 units/hr Daily Heparin level and CBC Monitor for bleeding complications Follow for transition to [place], Pharm.D., BCPS, BCIDP Clinical Pharmacist Phone: [phone] Please check AMION for all [place] numbers 08/04/2017, 9:53 AM

## 2017-08-04 NOTE — Progress Notes (Signed)
PROGRESS NOTE    MEMPHIS DECOTEAU  GYK:[id number] DOB: 1945-12-24 DOA: 08/01/2017 PCP: Cher Nakai, MD    Brief Narrative: Glenn Beck is a 72 y.o. male with a known history of CAD status post MI, GERD, anxiety and arthritis, asthma, bronchitis, hypertension hypothyroidism presented to the emergency department at [facility] with complaint of chest pressure and shortness of breath and dyspnea on exertion for the past 5 days.  He has been taking Augmentin, doxycycline and prednisone for lower extremely cellulitis for the past 10 days..  In the emergency department he was found to have a significant pulmonary embolism.   Of note he has recently been prescribed testosterone gel topically.  Otherwise there has been no change in status. Patient has been taking medication as prescribed and there has been no recent change in medication or diet.  There has been no recent illness, hospitalizations, travel or sick contacts.    EMS/ED Course: Patient was found a large pulmonary embolism for which he received heparin. Medical admission has been requested for further management of pulmonary embolism.    Assessment & Plan:   Active Problems:   Pulmonary embolism (HCC)  1-Pulmonary embolism; large right side. Acute hypoxic Respiratory Failure.  -Continue with heparin gtt.  -CT angio outside facility: Large right side pulmonary embolus extending from the right pulmonary artery into all lobar branches and multiple segmental branches within each lobe. No CT evidence of right heart strain.  -No further hemoptysis.  -Continue with heparin Gtt for at least 48 hours.  -Discussed with CCM , normal right ventricle function.  -troponin negative.  -Doppler L E; Positive for DVT right femoral, popliteal and tibial veins.  continue with IV heparin today, plan to transition to oral anticoagulation 7-23  2-Cellulitis LE, Bilateral.  Bilateral LE with redness, small open wound left.  Continue with  Vancomycin./ fail oral antibiotics.   3-DVT Right lower extremity; femoral, popliteal and tibial vein;  Continue with heparin gtt   COPD;  Continue with nebulizer PRN.   History of CAD;  Continue with aspirin, metoprolol.   History on chronic pain;  On Vicodin  and robaxin.   HTN;  Continue with metoprolol with holder parameter for hypotension.   Hypothyroidism;  Continue with synthroid.      DVT prophylaxis: heparin  Code Status: full code.  Family Communication: care discussed with patient  Disposition Plan: to be determine  Consultants:      Procedures:   ECHO  Doppler LE   Antimicrobials:      Subjective: He has been using oxygen at night to sleep. He is breathing better.    Objective: Vitals:   08/03/17 1253 08/03/17 1556 08/04/17 0500 08/04/17 0847  BP: 120/65 (!) 135/105 130/72 124/64  Pulse: 66 (!) 59 60 69  Resp: 20 (!) 22 (!) 22 20  Temp: 97.6 F (36.4 C) 97.8 F (36.6 C) 99.2 F (37.3 C) 97.6 F (36.4 C)  TempSrc: Oral Oral Oral Oral  SpO2: 95% 96% 100% 96%  Weight:      Height:        Intake/Output Summary (Last 24 hours) at 08/04/2017 1145 Last data filed at 08/04/2017 0916 Gross per 24 hour  Intake 1487.28 ml  Output 1225 ml  Net 262.28 ml   Filed Weights   08/01/17 2300 08/02/17 1400  Weight: 125 kg (275 lb 8 oz) 125 kg (275 lb 9.2 oz)    Examination:  General exam: NAD Respiratory system: Normal respiratory effort, crackles bases.  Cardiovascular system; S 1, S 2 RRR Gastrointestinal system: BS present, soft, nt Central nervous system: Non focal.  Extremities: symmetric power.  Skin: Bilateral LE with redness, warm, small open wound left . Redness persist     Data Reviewed: I have personally reviewed following labs and imaging studies  CBC: Recent Labs  Lab 08/02/17 0219 08/02/17 0557 08/03/17 0652 08/04/17 0317  WBC 10.6* 10.4 8.1 8.6  HGB 15.9 15.6 14.9 14.8  HCT 48.1 47.7 46.7 46.2  MCV 97.0 97.1  98.3 98.5  PLT 240 201 189 449   Basic Metabolic Panel: Recent Labs  Lab 08/02/17 0219 08/03/17 0652  NA 142 140  K 4.0 4.0  CL 104 104  CO2 28 27  GLUCOSE 96 93  BUN 17 13  CREATININE 1.01 1.01  CALCIUM 8.9 8.3*   GFR: Estimated Creatinine Clearance: 85.1 mL/min (by C-G formula based on SCr of 1.01 mg/dL). Liver Function Tests: No results for input(s): AST, ALT, ALKPHOS, BILITOT, PROT, ALBUMIN in the last 168 hours. No results for input(s): LIPASE, AMYLASE in the last 168 hours. No results for input(s): AMMONIA in the last 168 hours. Coagulation Profile: No results for input(s): INR, PROTIME in the last 168 hours. Cardiac Enzymes: Recent Labs  Lab 08/02/17 0557 08/02/17 1735 08/03/17 0019 08/03/17 0652  TROPONINI 0.03* <0.03 <0.03 <0.03   BNP (last 3 results) No results for input(s): PROBNP in the last 8760 hours. HbA1C: No results for input(s): HGBA1C in the last 72 hours. CBG: No results for input(s): GLUCAP in the last 168 hours. Lipid Profile: No results for input(s): CHOL, HDL, LDLCALC, TRIG, CHOLHDL, LDLDIRECT in the last 72 hours. Thyroid Function Tests: No results for input(s): TSH, T4TOTAL, FREET4, T3FREE, THYROIDAB in the last 72 hours. Anemia Panel: No results for input(s): VITAMINB12, FOLATE, FERRITIN, TIBC, IRON, RETICCTPCT in the last 72 hours. Sepsis Labs: No results for input(s): PROCALCITON, LATICACIDVEN in the last 168 hours.  No results found for this or any previous visit (from the past 240 hour(s)).       Radiology Studies: No results found.      Scheduled Meds: . aspirin  325 mg Oral Daily  . feeding supplement (ENSURE ENLIVE)  237 mL Oral BID BM  . latanoprost  1 drop Both Eyes QHS  . levothyroxine  200 mcg Oral QAC breakfast  . metoprolol succinate  100 mg Oral Daily  . multivitamin with minerals  1 tablet Oral Daily  . pantoprazole  40 mg Oral Daily   Continuous Infusions: . heparin 1,500 Units/hr (08/04/17 0745)  .  vancomycin 750 mg (08/04/17 0914)     LOS: 2 days    Time spent: 35 minutes.     Elmarie Shiley, MD [facility] Pager [phone]  If 7PM-7AM, please contact night-coverage [email or web address] Password [facility] 08/04/2017, 11:45 AM

## 2017-08-04 NOTE — Care Management Important Message (Signed)
Important Message  Patient Details  Name: Glenn Beck MRN: [id number] Date of Birth: [birth year]   Medicare Important Message Given:  Yes    Orbie Pyo 08/04/2017, 3:03 PM

## 2017-08-04 NOTE — Progress Notes (Signed)
PT Cancellation Note  Patient Details Name: Glenn Beck MRN: [id number] DOB: 09-Jan-1946   Cancelled Treatment:    Reason Eval/Treat Not Completed: (P) Medical issues which prohibited therapy Pt continues to be on bedrest. Requested RN contact physician to remove bed rest order for evaluation. PT will follow back tomorrow.   Majestic Brister B. Migdalia Dk PT, DPT Acute Rehabilitation  [phone] Pager [phone]   [place] 08/04/2017, 3:42 PM

## 2017-08-05 ENCOUNTER — Encounter (HOSPITAL_COMMUNITY): Payer: Self-pay | Admitting: Acute Care

## 2017-08-05 DIAGNOSIS — Z87891 Personal history of nicotine dependence: Secondary | ICD-10-CM

## 2017-08-05 DIAGNOSIS — I2699 Other pulmonary embolism without acute cor pulmonale: Principal | ICD-10-CM

## 2017-08-05 DIAGNOSIS — J432 Centrilobular emphysema: Secondary | ICD-10-CM

## 2017-08-05 DIAGNOSIS — G4730 Sleep apnea, unspecified: Secondary | ICD-10-CM

## 2017-08-05 DIAGNOSIS — J9601 Acute respiratory failure with hypoxia: Secondary | ICD-10-CM

## 2017-08-05 DIAGNOSIS — Z6839 Body mass index (BMI) 39.0-39.9, adult: Secondary | ICD-10-CM

## 2017-08-05 LAB — CBC
HCT: 46.8 % (ref 39.0–52.0)
Hemoglobin: 15.2 g/dL (ref 13.0–17.0)
MCH: 32 pg (ref 26.0–34.0)
MCHC: 32.5 g/dL (ref 30.0–36.0)
MCV: 98.5 fL (ref 78.0–100.0)
Platelets: 212 10*3/uL (ref 150–400)
RBC: 4.75 MIL/uL (ref 4.22–5.81)
RDW: 13 % (ref 11.5–15.5)
WBC: 9.1 10*3/uL (ref 4.0–10.5)

## 2017-08-05 LAB — VANCOMYCIN, TROUGH: Vancomycin Tr: 12 ug/mL — ABNORMAL LOW (ref 15–20)

## 2017-08-05 LAB — HEPARIN LEVEL (UNFRACTIONATED): Heparin Unfractionated: 0.34 IU/mL (ref 0.30–0.70)

## 2017-08-05 LAB — MRSA PCR SCREENING: MRSA by PCR: NEGATIVE

## 2017-08-05 MED ORDER — APIXABAN 5 MG PO TABS: 5.0000 mg | ORAL_TABLET | Freq: Two times a day (BID) | ORAL | Status: DC

## 2017-08-05 MED ORDER — APIXABAN 5 MG PO TABS
10.0000 mg | ORAL_TABLET | Freq: Two times a day (BID) | ORAL | Status: DC
  Administered 2017-08-05 – 2017-08-07 (×5): 10 mg via ORAL
  Filled 2017-08-05 (×6): qty 2

## 2017-08-05 NOTE — Progress Notes (Signed)
Pt placed on CPAP at auto settings for the night.  5lpm bled into machine as patient was on 5lpm on arrival to set up equipment.  Pt tolerating auto setting with full face mask at this time.  Patient able to remove mask if needed.  Pt encouraged to call for assistance if unable to tolerate machine for the night.

## 2017-08-05 NOTE — Progress Notes (Signed)
ANTICOAGULATION CONSULT NOTE - Follow Up Consult  Pharmacy Consult for Heparin Indication: pulmonary embolus  Allergies  Allergen Reactions  . Nasacort [Triamcinolone] Hives  . Novocain [Procaine] Rash  . Other Rash    Reaction to unknown depression medication    Patient Measurements: Height: 5\' 10"  (177.8 cm) Weight: 275 lb 9.2 oz (125 kg) IBW/kg (Calculated) : 73 Heparin Dosing Weight: 101 kg  Vital Signs: Temp: 97.6 F (36.4 C) (07/23 0829) Temp Source: Oral (07/23 0829) BP: 121/76 (07/23 0829) Pulse Rate: 65 (07/23 0829)  Labs: Recent Labs    08/02/17 1735 08/03/17 0019  08/03/17 8182 08/03/17 0733 08/04/17 0317 08/05/17 0144  HGB  --   --    < > 14.9  --  14.8 15.2  HCT  --   --   --  46.7  --  46.2 46.8  PLT  --   --   --  189  --  199 212  HEPARINUNFRC  --   --   --   --  0.65 0.46 0.34  CREATININE  --   --   --  1.01  --   --   --   TROPONINI <0.03 <0.03  --  <0.03  --   --   --    < > = values in this interval not displayed.    Estimated Creatinine Clearance: 85.1 mL/min (by C-G formula based on SCr of 1.01 mg/dL).   Medications:  Infusions:  . heparin 1,500 Units/hr (08/05/17 0400)  . vancomycin 750 mg (08/05/17 1001)    Assessment: 72 year old male on anticoagulation with heparin for pulmonary embolism.  His heparin level remains therapeutic today but has trended down over the last 48 hours. His CBC is stable with no bleeding noted. Note anticipated transition to [place] therapy in next 24 hours.   Goal of Therapy:  Heparin level 0.3-0.7 units/ml Monitor platelets by anticoagulation protocol: Yes   Plan:  Increase heparin slightly to 1600 units/hr Daily Heparin level and CBC Monitor for bleeding complications Follow for transition to [place], PharmD, BCPS Please check AMION for all [place] numbers 08/05/2017, 10:13 AM

## 2017-08-05 NOTE — Progress Notes (Signed)
ANTICOAGULATION CONSULT NOTE - Follow Up Consult  Pharmacy Consult for Heparin >> Apixaban  Indication: pulmonary embolus  Allergies  Allergen Reactions  . Nasacort [Triamcinolone] Hives  . Novocain [Procaine] Rash  . Other Rash    Reaction to unknown depression medication    Patient Measurements: Height: 5\' 10"  (177.8 cm) Weight: 275 lb 9.2 oz (125 kg) IBW/kg (Calculated) : 73 Heparin Dosing Weight: 101 kg  Vital Signs: Temp: 97.6 F (36.4 C) (07/23 0829) Temp Source: Oral (07/23 0829) BP: 121/76 (07/23 0829) Pulse Rate: 65 (07/23 0829)  Labs: Recent Labs    08/02/17 1735 08/03/17 0019  08/03/17 3154 08/03/17 0733 08/04/17 0317 08/05/17 0144  HGB  --   --    < > 14.9  --  14.8 15.2  HCT  --   --   --  46.7  --  46.2 46.8  PLT  --   --   --  189  --  199 212  HEPARINUNFRC  --   --   --   --  0.65 0.46 0.34  CREATININE  --   --   --  1.01  --   --   --   TROPONINI <0.03 <0.03  --  <0.03  --   --   --    < > = values in this interval not displayed.    Estimated Creatinine Clearance: 85.1 mL/min (by C-G formula based on SCr of 1.01 mg/dL).   Medications:  Infusions:  . vancomycin 750 mg (08/05/17 1001)    Assessment: 72 year old male on anticoagulation with heparin for pulmonary embolism. Plan to transition to apixaban for further treatment.   Goal of Therapy:  Monitor platelets by anticoagulation protocol: Yes   Plan:  1. Apixaban 10 mg BID for 7 days followed by 5 mg BID afterwards  2. Counsel before discharge    Vincenza Hews, PharmD, BCPS Please check AMION for all [place] numbers 08/05/2017, 1:12 PM

## 2017-08-05 NOTE — Progress Notes (Signed)
Pt with O2 saturation dropping while asleep overnight. Around midnight the pt O2 saturation dropped into the 50's. The pt was able to bring O2 saturation back up into the 90's after being placed onto a NRB at 10 L. The pt then dropped again overnight into the 70's after their mask came off. The NRB was placed back on and the pt's O2 saturation rose to 100%. Pt able to maintain O2 saturation in the 90's on RA while awake and is only requiring O2 while asleep. On call NP notified. No new orders. Will continue to monitor.

## 2017-08-05 NOTE — Progress Notes (Signed)
PT Cancellation Note  Patient Details Name: Glenn Beck MRN: [id number] DOB: [birth year]   Cancelled Treatment:    Reason Eval/Treat Not Completed: (P) Medical issues which prohibited therapy Pt continues to be on bed rest due to decreased heparin levels over last 48 hours. RN to contact physician to see if bed rest orders can be lifted for PT evaluation. PT will follow back as able.   Ayat Drenning B. Migdalia Dk PT, DPT Acute Rehabilitation  [phone] Pager [phone]   [place] 08/05/2017, 1:55 PM

## 2017-08-05 NOTE — Progress Notes (Signed)
Pharmacy Antibiotic Note Glenn Beck is a 72 y.o. male admitted on 08/01/2017 with cellulitis that failed to respond to oral abx as an outpatient. Currently on day 4 of IV vancomycin.   Vancomycin trough of 12 this am is therapeutic (goal 10-15) based on indication.  SCr stable from 7/21.   Plan: 1. Continue vancomycin 750 mg IV every 12 hours  2. BMP in am   Height: 5\' 10"  (177.8 cm) Weight: 275 lb 9.2 oz (125 kg) IBW/kg (Calculated) : 73  Temp (24hrs), Avg:97.8 F (36.6 C), Min:97.5 F (36.4 C), Max:98.3 F (36.8 C)  Recent Labs  Lab 08/02/17 0219 08/02/17 0557 08/03/17 0652 08/04/17 0317 08/05/17 0144 08/05/17 0858  WBC 10.6* 10.4 8.1 8.6 9.1  --   CREATININE 1.01  --  1.01  --   --   --   VANCOTROUGH  --   --   --   --   --  12*    Estimated Creatinine Clearance: 85.1 mL/min (by C-G formula based on SCr of 1.01 mg/dL).    Allergies  Allergen Reactions  . Nasacort [Triamcinolone] Hives  . Novocain [Procaine] Rash  . Other Rash    Reaction to unknown depression medication    Antimicrobials this admission: 7/20 Vancomycin >>    Thank you for allowing pharmacy to be a part of this patient's care.  Vincenza Hews, PharmD, BCPS 08/05/2017, 10:20 AM

## 2017-08-05 NOTE — Consult Note (Signed)
Name: Glenn Beck MRN: [id number] DOB: 07/15/1945    ADMISSION DATE:  08/01/2017 CONSULTATION DATE:  08/05/2017  REFERRING MD : Tyrell Antonio  CHIEF COMPLAINT:  Nocturnal desaturations  BRIEF PATIENT DESCRIPTION:  Obese elderly male supine in bed on RA with saturations in the 95% range, NAD  SIGNIFICANT EVENTS  Admission 08/01/2017 for PE confirmed per CT at [place]:  08/01/2017>> CT angio outside facility: Large right side pulmonary embolus extending from the right pulmonary artery into all lobar branches and multiple segmental branches within each lobe. No CT evidence of right heart strain. Emphysema noted upper lobes  08/02/2017>> Vascular US + for DVT (  acute DVT in the Femoral vein, Popliteal vein, and Posterior Tibial veins.)  5/80/9983 Echo Systolic function was   normal. The estimated ejection fraction was in the range of 50%   to 55%. Although no diagnostic regional wall motion abnormality   was identified, this possibility cannot be completely excluded on   the basis of this study. - Aortic valve: Transvalvular velocity was within the normal range.   There was no stenosis. There was no regurgitation. - Mitral valve: Calcified annulus. There was no significant   regurgitation. Valve area by pressure half-time: 1.45 cm^2. - Right ventricle: Systolic function was normal. - Atrial septum: No defect or patent foramen ovale was identified. - Tricuspid valve: There was trivial regurgitation. - Pulmonic valve: There was no significant regurgitation.  Impressions:  - Technically difficult study, but no significant abnormalities   noted.   HISTORY OF PRESENT ILLNESS:  [name] a 72 y.o.maleformer smoker ( Quit 2001 with a 60 pack year smoking history) with a known history of CAD status post MI, GERD, anxiety and arthritis, asthma, bronchitis, suspected COPD, hypertension hypothyroidism presented to the emergency department at [facility] on 7/19  with complaint of chest pressure and shortness of breath and dyspnea on exertion for the past 5 days. He has been taking Augmentin, doxycycline and prednisone for lower extremely cellulitis for the past 10 days. Per the patient worse in the L leg than right.In the emergency department he was found to have a significant pulmonary embolism per CTA.Marland Kitchen  He had recently been prescribed testosterone gel topically.  Otherwise there has been no change in status. Patient has been taking medication as prescribed and there has been no recent change in medication or diet. There has been no recent illness, hospitalizations, travel or sick contacts.   He was progressing well and was noted to have significant desaturation at night into the 50's , which responded well to NRB at 10L. PCCM have been asked to evaluate patient for nocturnal desaturations in patient recently diagnosed with PE, and DVT .  Of note, when Dr. Halford Chessman was seeing the patient this morning he had a desaturation to 60%, which responded well to  NRB, sats rebounded to the 90's  EMS/ED Course:Patient was found a large pulmonary embolism for which hereceivedheparin.   PAST MEDICAL HISTORY :   has a past medical history of Anxiety, Arthritis, Asthma, Chronic back pain, Complication of anesthesia, Diverticulosis, GERD (gastroesophageal reflux disease), Glaucoma, Gout, Headache, History of colon polyps, History of shingles, Hypertension, Hypothyroidism, Insomnia, Joint pain, Kidney infection, Myocardial infarction ([place]) (>54yrs ago), Pneumonia (31yrs ago), Shortness of breath dyspnea, and Swallowing difficulty.  has a past surgical history that includes Back surgery; Tonsillectomy; Colonoscopy; Esophagogastroduodenoscopy; Anterior cervical decomp/discectomy fusion (N/A, 04/19/2014); Cardiac catheterization (>78yrs ago); Eye surgery (Bilateral); Hernia repair (Right); Knee arthroscopy (Right); and  Total knee arthroplasty (Right,  12/26/2014). Prior to Admission medications   Medication Sig Start Date End Date Taking? Authorizing Provider  albuterol (PROVENTIL HFA;VENTOLIN HFA) 108 (90 BASE) MCG/ACT inhaler Inhale 2 puffs into the lungs every 6 (six) hours as needed for wheezing or shortness of breath.   Yes [provider]  amoxicillin-clavulanate (AUGMENTIN) 875-125 MG tablet Take 1 tablet by mouth 2 (two) times daily. 10 day course ordered 07/25/17 07/25/17  Yes [provider]  aspirin EC 325 MG tablet Take 325 mg by mouth daily as needed (headache).   Yes [provider]  colchicine 0.6 MG tablet Take 0.6 mg by mouth daily as needed (gout attack).    Yes [provider]  doxycycline (VIBRA-TABS) 100 MG tablet Take 100 mg by mouth 2 (two) times daily. 10 day course ordered 07/25/17 07/25/17  Yes [provider]  esomeprazole (NEXIUM) 20 MG capsule Take 20 mg by mouth daily as needed (acid reflux).    Yes [provider]  HYDROcodone-acetaminophen (NORCO) 10-325 MG tablet Take 1 tablet by mouth 3 (three) times daily as needed (pain). for pain 07/04/17  Yes [provider]  ipratropium-albuterol (DUONEB) 0.5-2.5 (3) MG/3ML SOLN Take 3 mLs by nebulization 4 (four) times daily as needed (shortness of breath/wheezing).  05/14/17  Yes [provider]  latanoprost (XALATAN) 0.005 % ophthalmic solution Place 1 drop into both eyes at bedtime.   Yes [provider]  levothyroxine (SYNTHROID, LEVOTHROID) 200 MCG tablet Take 200 mcg by mouth daily before breakfast.   Yes [provider]  loratadine (CLARITIN) 10 MG tablet Take 10 mg by mouth daily as needed (seasonal allergies).    Yes [provider]  metoprolol succinate (TOPROL-XL) 100 MG 24 hr tablet Take 100 mg by mouth daily. 07/15/17  Yes [provider]  predniSONE (DELTASONE) 10 MG tablet Take 10-60 mg by mouth See admin instructions. Take 6 tablets (60 mg) by mouth daily for 2  days, then take 4 tablets (40 mg) daily for 2 days, then take 2 tablets (20 mg) daily for 2 days, then take 1 tablet (10 mg) daily for 2 days, then stop 07/30/17  Yes [provider]  tamsulosin (FLOMAX) 0.4 MG CAPS capsule Take 0.4 mg by mouth daily as needed (infrequent urination).  06/14/17  Yes [provider]  triazolam (HALCION) 0.25 MG tablet Take 0.125 mg by mouth at bedtime.    Yes [provider]  methocarbamol (ROBAXIN) 500 MG tablet Take 1-2 tablets (500-1,000 mg total) by mouth every 6 (six) hours as needed for muscle spasms. Patient not taking: Reported on 08/02/2017 12/28/14   Carlynn Spry, PA-C   Allergies  Allergen Reactions  . Nasacort [Triamcinolone] Hives  . Novocain [Procaine] Rash  . Other Rash    Reaction to unknown depression medication    FAMILY HISTORY:  family history is not on file. SOCIAL HISTORY:  reports that he has quit smoking. His smoking use included cigarettes. He has quit using smokeless tobacco. He reports that he does not drink alcohol or use drugs.  REVIEW OF SYSTEMS:   Constitutional: Negative for fever, chills, weight loss, + malaise/fatigue and diaphoresis.  HENT: Negative for hearing loss, ear pain, nosebleeds, congestion, sore throat, neck pain, tinnitus and ear discharge.   Eyes: Negative for blurred vision, double vision, photophobia, pain, discharge and redness.  Respiratory: + for cough,+  Hemoptysis ( which he states has resolved) , + sputum production, + shortness of breath with exertion ,  No wheezing or stridor.   Cardiovascular: Negative for chest pain, palpitations, orthopnea, claudication, + leg swelling and PND.  Gastrointestinal: Negative for heartburn, nausea, vomiting, abdominal pain, diarrhea, constipation, blood in stool and melena.  Genitourinary: Negative for dysuria, urgency, frequency, hematuria and flank pain.  Musculoskeletal: Negative for myalgias, back pain, joint pain and falls.  Skin: Negative  for itching and rash.  Neurological: Negative for dizziness, tingling, tremors, sensory change, speech change, focal weakness, seizures, loss of consciousness, weakness and headaches.  Endo/Heme/Allergies: Negative for environmental allergies and polydipsia. Does not bruise/bleed easily.  SUBJECTIVE:  Awake and alert, states he is feeling better, and breathing better while he is awake. Denies the chest pressure he felt on admission  VITAL SIGNS: Temp:  [97.5 F (36.4 C)-98.3 F (36.8 C)] 97.6 F (36.4 C) (07/23 0829) Pulse Rate:  [59-78] 65 (07/23 0829) Resp:  [16-22] 22 (07/23 0829) BP: (106-133)/(63-76) 121/76 (07/23 0829) SpO2:  [51 %-100 %] 98 % (07/23 0829)  PHYSICAL EXAMINATION: General:  Awake and alert, supine in bed, NAD Neuro: Alert and oriented x 3, MAE x 4, appropriate HEENT: Thick neck, NCAT, No LAD, PERRLA Cardiovascular:  S1, S2, RRR No RMG Lungs: Bilateral chest excursion, Diminished per bases, Clear without wheezing or stridor noted Abdomen:  NT, ND, BS +, Obese Extremities: Cellulitis noted bilaterally lower legs, brisk refill noted Musculoskeletal: Adequate bulk, No obvious deformities Skin: Bruising noted, warm and dry, intact  Recent Labs  Lab 08/02/17 0219 08/03/17 0652  NA 142 140  K 4.0 4.0  CL 104 104  CO2 28 27  BUN 17 13  CREATININE 1.01 1.01  GLUCOSE 96 93   Recent Labs  Lab 08/03/17 0652 08/04/17 0317 08/05/17 0144  HGB 14.9 14.8 15.2  HCT 46.7 46.2 46.8  WBC 8.1 8.6 9.1  PLT 189 199 212   No results found.  ASSESSMENT / PLAN:  Nocturnal Desaturations with  multifactorial etiology secondary to PE, suspected underlying COPD, and suspected OSA.  PE Plan Continue anticoagulation with transition to po agent  per Primary team Oxygen as needed to maintain sats  88-92% Follow up Pulmonary as outpatient  Suspected COPD Emphysema noted per CT 60 pack year smoking history, quit 2001 CO2  WNL on BMET Plan Oxygen as needed to  maintain saturations 88-92% Continue Duonebs and prn albuterol Will need pulmonary follow up as outpatient with PFT's  For diagnosis/ management  Ambulatory saturation test at discharge  OSA Suspicion high for OSA with nocturnal desaturations noted History of snoring Awakening with HA Awakening tired with fatigue Body mass index is 39.54 kg/m.  Plan: Follow up Pulmonary as OP for Home Sleep Study  If  + for OSA will need CPAP therapy Start CPAP in the hospital at [facility] Consider discharging home on nocturnal oxygen until OSA diagnosis can be confirmed. Pulmonary Hospital Follow up scheduled for 08/18/2017 at 10:15 with Wyn Quaker NP  Pulmonary will follow with you.   Magdalen Spatz, NP [facility] [place] Pager: [phone]  08/05/2017, 9:48 AM

## 2017-08-05 NOTE — Progress Notes (Signed)
PROGRESS NOTE    DARRAGH NAY  OBS:[id number] DOB: 08/20/1945 DOA: 08/01/2017 PCP: Cher Nakai, MD    Brief Narrative: Glenn Beck is a 72 y.o. male with a known history of CAD status post MI, GERD, anxiety and arthritis, asthma, bronchitis, hypertension hypothyroidism presented to the emergency department at [facility] with complaint of chest pressure and shortness of breath and dyspnea on exertion for the past 5 days.  He has been taking Augmentin, doxycycline and prednisone for lower extremely cellulitis for the past 10 days..  In the emergency department he was found to have a significant pulmonary embolism.   Of note he has recently been prescribed testosterone gel topically.  Otherwise there has been no change in status. Patient has been taking medication as prescribed and there has been no recent change in medication or diet.  There has been no recent illness, hospitalizations, travel or sick contacts.    EMS/ED Course: Patient was found a large pulmonary embolism for which he received heparin. Medical admission has been requested for further management of pulmonary embolism.    Assessment & Plan:   Active Problems:   Pulmonary embolism (HCC)  1-Pulmonary embolism; large right side. Acute hypoxic Respiratory Failure.  -Continue with heparin gtt.  -CT angio outside facility: Large right side pulmonary embolus extending from the right pulmonary artery into all lobar branches and multiple segmental branches within each lobe. No CT evidence of right heart strain.  -No further hemoptysis.  -troponin negative.  -Doppler L E; Positive for DVT right femoral, popliteal and tibial veins.  -He has been on heparin gtt for 72 hours, will transition to oral anticoagulation today, start eliquis.    2-Cellulitis LE, Bilateral.  Bilateral LE with redness, small open wound left.  Continue with Vancomycin./ fail oral antibiotics.  Improving.   3-DVT Right lower extremity;  femoral, popliteal and tibial vein;  Continue with heparin gtt Plan to transition to oral anticoagulation today.    COPD; chronic hypoxic respiratory failure.  Hypoxemia at HS, suspect related to sleep apnea. Pulmonary consulted. Will try CPAP. Patient to follow with them for sleep study. Will ask CM to help arrange home CPAP./  Continue with nebulizer PRN.   History of CAD;  Continue with aspirin, metoprolol.   History on chronic pain;  On Vicodin  and robaxin.   HTN;  Continue with metoprolol with holder parameter for hypotension.   Hypothyroidism;  Continue with synthroid.      DVT prophylaxis: heparin  Code Status: full code.  Family Communication: care discussed with patient  Disposition Plan: to be determine  Consultants:      Procedures:   ECHO  Doppler LE   Antimicrobials:      Subjective: He is breathing better, redness LE has improved. Also edema has decreased.   Objective: Vitals:   08/05/17 0432 08/05/17 0500 08/05/17 0629 08/05/17 0829  BP:    121/76  Pulse:    65  Resp:    (!) 22  Temp:    97.6 F (36.4 C)  TempSrc:    Oral  SpO2: (!) 87% 100% 98% 98%  Weight:      Height:        Intake/Output Summary (Last 24 hours) at 08/05/2017 1234 Last data filed at 08/05/2017 0600 Gross per 24 hour  Intake 1355.3 ml  Output 900 ml  Net 455.3 ml   Filed Weights   08/01/17 2300 08/02/17 1400  Weight: 125 kg (275 lb 8 oz) 125 kg (275  lb 9.2 oz)    Examination:  General exam: NAD Respiratory system: Normal Respiratory effort. No wheezing, no crackles.  Cardiovascular system; S 1, S 2 RRR Gastrointestinal system: BS present, soft, nt Central nervous system: Non focal.  Extremities: Symmetric power.  Skin: Bilateral LE redness.    Data Reviewed: I have personally reviewed following labs and imaging studies  CBC: Recent Labs  Lab 08/02/17 0219 08/02/17 0557 08/03/17 0652 08/04/17 0317 08/05/17 0144  WBC 10.6* 10.4 8.1 8.6 9.1    HGB 15.9 15.6 14.9 14.8 15.2  HCT 48.1 47.7 46.7 46.2 46.8  MCV 97.0 97.1 98.3 98.5 98.5  PLT 240 201 189 199 937   Basic Metabolic Panel: Recent Labs  Lab 08/02/17 0219 08/03/17 0652  NA 142 140  K 4.0 4.0  CL 104 104  CO2 28 27  GLUCOSE 96 93  BUN 17 13  CREATININE 1.01 1.01  CALCIUM 8.9 8.3*   GFR: Estimated Creatinine Clearance: 85.1 mL/min (by C-G formula based on SCr of 1.01 mg/dL). Liver Function Tests: No results for input(s): AST, ALT, ALKPHOS, BILITOT, PROT, ALBUMIN in the last 168 hours. No results for input(s): LIPASE, AMYLASE in the last 168 hours. No results for input(s): AMMONIA in the last 168 hours. Coagulation Profile: No results for input(s): INR, PROTIME in the last 168 hours. Cardiac Enzymes: Recent Labs  Lab 08/02/17 0557 08/02/17 1735 08/03/17 0019 08/03/17 0652  TROPONINI 0.03* <0.03 <0.03 <0.03   BNP (last 3 results) No results for input(s): PROBNP in the last 8760 hours. HbA1C: No results for input(s): HGBA1C in the last 72 hours. CBG: No results for input(s): GLUCAP in the last 168 hours. Lipid Profile: No results for input(s): CHOL, HDL, LDLCALC, TRIG, CHOLHDL, LDLDIRECT in the last 72 hours. Thyroid Function Tests: No results for input(s): TSH, T4TOTAL, FREET4, T3FREE, THYROIDAB in the last 72 hours. Anemia Panel: No results for input(s): VITAMINB12, FOLATE, FERRITIN, TIBC, IRON, RETICCTPCT in the last 72 hours. Sepsis Labs: No results for input(s): PROCALCITON, LATICACIDVEN in the last 168 hours.  Recent Results (from the past 240 hour(s))  MRSA PCR Screening     Status: None   Collection Time: 08/05/17  6:05 AM  Result Value Ref Range Status   MRSA by PCR NEGATIVE NEGATIVE Final    Comment:        The GeneXpert MRSA Assay (FDA approved for NASAL specimens only), is one component of a comprehensive MRSA colonization surveillance program. It is not intended to diagnose MRSA infection nor to guide or monitor treatment  for MRSA infections. Performed at [facility], [place] [address] [place], [place] [address]          Radiology Studies: No results found.      Scheduled Meds: . aspirin  325 mg Oral Daily  . feeding supplement (ENSURE ENLIVE)  237 mL Oral BID BM  . latanoprost  1 drop Both Eyes QHS  . levothyroxine  200 mcg Oral QAC breakfast  . metoprolol succinate  100 mg Oral Daily  . multivitamin with minerals  1 tablet Oral Daily  . pantoprazole  40 mg Oral Daily   Continuous Infusions: . heparin 1,500 Units/hr (08/05/17 0400)  . vancomycin 750 mg (08/05/17 1001)     LOS: 3 days    Time spent: 35 minutes.     Elmarie Shiley, MD [facility] Pager [phone]  If 7PM-7AM, please contact night-coverage [email or web address] Password [facility] 08/05/2017, 12:34 PM

## 2017-08-06 DIAGNOSIS — G4733 Obstructive sleep apnea (adult) (pediatric): Secondary | ICD-10-CM

## 2017-08-06 DIAGNOSIS — R0902 Hypoxemia: Secondary | ICD-10-CM

## 2017-08-06 DIAGNOSIS — Z7189 Other specified counseling: Secondary | ICD-10-CM

## 2017-08-06 LAB — BASIC METABOLIC PANEL
Anion gap: 5 (ref 5–15)
BUN: 15 mg/dL (ref 8–23)
CO2: 33 mmol/L — ABNORMAL HIGH (ref 22–32)
Calcium: 9.2 mg/dL (ref 8.9–10.3)
Chloride: 105 mmol/L (ref 98–111)
Creatinine, Ser: 0.89 mg/dL (ref 0.61–1.24)
GFR calc Af Amer: >60 mL/min (ref >60)
GFR calc non Af Amer: >60 mL/min (ref >60)
Glucose, Bld: 98 mg/dL (ref 70–99)
Potassium: 5.3 mmol/L — ABNORMAL HIGH (ref 3.5–5.1)
Sodium: 143 mmol/L (ref 135–145)

## 2017-08-06 LAB — CBC
HCT: 48.9 % (ref 39.0–52.0)
Hemoglobin: 15.5 g/dL (ref 13.0–17.0)
MCH: 31.6 pg (ref 26.0–34.0)
MCHC: 31.7 g/dL (ref 30.0–36.0)
MCV: 99.6 fL (ref 78.0–100.0)
Platelets: 210 10*3/uL (ref 150–400)
RBC: 4.91 MIL/uL (ref 4.22–5.81)
RDW: 13.1 % (ref 11.5–15.5)
WBC: 8.9 10*3/uL (ref 4.0–10.5)

## 2017-08-06 MED ORDER — SODIUM POLYSTYRENE SULFONATE 15 GM/60ML PO SUSP
15.0000 g | Freq: Once | ORAL | Status: AC
  Administered 2017-08-06: 15 g via ORAL
  Filled 2017-08-06: qty 60

## 2017-08-06 NOTE — Progress Notes (Signed)
PROGRESS NOTE    Glenn Beck  VOZ:[id number] DOB: Dec 26, 1945 DOA: 08/01/2017 PCP: Cher Nakai, MD    Brief Narrative: Glenn Beck is a 72 y.o. male with a known history of CAD status post MI, GERD, anxiety and arthritis, asthma, bronchitis, hypertension hypothyroidism presented to the emergency department at [facility] with complaint of chest pressure and shortness of breath and dyspnea on exertion for the past 5 days.  He has been taking Augmentin, doxycycline and prednisone for lower extremely cellulitis for the past 10 days..  In the emergency department he was found to have a significant pulmonary embolism.   Of note he has recently been prescribed testosterone gel topically.  Otherwise there has been no change in status. Patient has been taking medication as prescribed and there has been no recent change in medication or diet.  There has been no recent illness, hospitalizations, travel or sick contacts.    EMS/ED Course: Patient was found a large pulmonary embolism for which he received heparin. Medical admission has been requested for further management of pulmonary embolism.    Assessment & Plan:   Active Problems:   Pulmonary embolism (HCC)  1-Pulmonary embolism; large right side. Acute hypoxic Respiratory Failure.  -Continue with heparin gtt.  -CT angio outside facility: Large right side pulmonary embolus extending from the right pulmonary artery into all lobar branches and multiple segmental branches within each lobe. No CT evidence of right heart strain.  -No further hemoptysis.  -troponin negative.  -Doppler L E; Positive for DVT right femoral, popliteal and tibial veins.  -He has been on heparin gtt for 72 hours, transition to eliquis 7-23 Stable.    2-Cellulitis LE, Bilateral.  Bilateral LE with redness, small open wound left.  Continue with Vancomycin./ fail oral antibiotics.  Improving.    3-DVT Right lower extremity; femoral, popliteal and  tibial vein;  Continue with heparin gtt Plan to transition to oral anticoagulation today.    COPD; chronic hypoxic respiratory failure.  Hypoxemia at HS, suspect related to sleep apnea. Pulmonary consulted. Will try CPAP. Patient to follow with them for sleep study. Will ask CM to help arrange home CPAP./  Continue with nebulizer PRN.  Will need home oxygen for HS>   History of CAD;  Continue with aspirin, metoprolol.   History on chronic pain;  On Vicodin  and robaxin.   HTN;  Continue with metoprolol with holder parameter for hypotension.   Hypothyroidism;  Continue with synthroid.      DVT prophylaxis: heparin  Code Status: full code.  Family Communication: care discussed with patient  Disposition Plan: to be determine  Consultants:      Procedures:   ECHO  Doppler LE   Antimicrobials:      Subjective: He is feeling better, dyspnea improved.  Lower extremity redness improved.   Objective: Vitals:   08/05/17 2330 08/06/17 0400 08/06/17 0803 08/06/17 1123  BP: 119/74 128/72 (!) 112/92 122/73  Pulse: (!) 55 (!) 58 (!) 59 63  Resp: 20 18 (!) 21 20  Temp: 98.1 F (36.7 C) 98.1 F (36.7 C) 98 F (36.7 C) 97.7 F (36.5 C)  TempSrc: Oral Oral Oral Oral  SpO2: 96% 99% 96% 96%  Weight:      Height:        Intake/Output Summary (Last 24 hours) at 08/06/2017 1151 Last data filed at 08/06/2017 0815 Gross per 24 hour  Intake 1474.02 ml  Output 950 ml  Net 524.02 ml   [employer]  08/01/17 2300 08/02/17 1400  Weight: 125 kg (275 lb 8 oz) 125 kg (275 lb 9.2 oz)    Examination:  General exam: NAD Respiratory system: Normal respiratory effort. No crackles.  Cardiovascular system; S 1, S 2 RRR Gastrointestinal system: BS present, soft, nt Central nervous system: non focal.  Extremities: symmetric power.  Skin: Bilateral LE redness decreased.     Data Reviewed: I have personally reviewed following labs and imaging studies  CBC: Recent  Labs  Lab 08/02/17 0557 08/03/17 0652 08/04/17 0317 08/05/17 0144 08/06/17 0315  WBC 10.4 8.1 8.6 9.1 8.9  HGB 15.6 14.9 14.8 15.2 15.5  HCT 47.7 46.7 46.2 46.8 48.9  MCV 97.1 98.3 98.5 98.5 99.6  PLT 201 189 199 212 709   Basic Metabolic Panel: Recent Labs  Lab 08/02/17 0219 08/03/17 0652 08/06/17 0315  NA 142 140 143  K 4.0 4.0 5.3*  CL 104 104 105  CO2 28 27 33*  GLUCOSE 96 93 98  BUN 17 13 15   CREATININE 1.01 1.01 0.89  CALCIUM 8.9 8.3* 9.2   GFR: Estimated Creatinine Clearance: 96.6 mL/min (by C-G formula based on SCr of 0.89 mg/dL). Liver Function Tests: No results for input(s): AST, ALT, ALKPHOS, BILITOT, PROT, ALBUMIN in the last 168 hours. No results for input(s): LIPASE, AMYLASE in the last 168 hours. No results for input(s): AMMONIA in the last 168 hours. Coagulation Profile: No results for input(s): INR, PROTIME in the last 168 hours. Cardiac Enzymes: Recent Labs  Lab 08/02/17 0557 08/02/17 1735 08/03/17 0019 08/03/17 0652  TROPONINI 0.03* <0.03 <0.03 <0.03   BNP (last 3 results) No results for input(s): PROBNP in the last 8760 hours. HbA1C: No results for input(s): HGBA1C in the last 72 hours. CBG: No results for input(s): GLUCAP in the last 168 hours. Lipid Profile: No results for input(s): CHOL, HDL, LDLCALC, TRIG, CHOLHDL, LDLDIRECT in the last 72 hours. Thyroid Function Tests: No results for input(s): TSH, T4TOTAL, FREET4, T3FREE, THYROIDAB in the last 72 hours. Anemia Panel: No results for input(s): VITAMINB12, FOLATE, FERRITIN, TIBC, IRON, RETICCTPCT in the last 72 hours. Sepsis Labs: No results for input(s): PROCALCITON, LATICACIDVEN in the last 168 hours.  Recent Results (from the past 240 hour(s))  MRSA PCR Screening     Status: None   Collection Time: 08/05/17  6:05 AM  Result Value Ref Range Status   MRSA by PCR NEGATIVE NEGATIVE Final    Comment:        The GeneXpert MRSA Assay (FDA approved for NASAL specimens only), is one  component of a comprehensive MRSA colonization surveillance program. It is not intended to diagnose MRSA infection nor to guide or monitor treatment for MRSA infections. Performed at [facility], [place] [address] [place], [place] [address]          Radiology Studies: No results found.      Scheduled Meds: . apixaban  10 mg Oral BID   Followed by  . [START ON 08/12/2017] apixaban  5 mg Oral BID  . aspirin  325 mg Oral Daily  . feeding supplement (ENSURE ENLIVE)  237 mL Oral BID BM  . latanoprost  1 drop Both Eyes QHS  . levothyroxine  200 mcg Oral QAC breakfast  . metoprolol succinate  100 mg Oral Daily  . multivitamin with minerals  1 tablet Oral Daily  . pantoprazole  40 mg Oral Daily   Continuous Infusions: . vancomycin 750 mg (08/06/17 0959)     LOS:  4 days    Time spent: 35 minutes.     Elmarie Shiley, MD [facility] Pager [phone]  If 7PM-7AM, please contact night-coverage [email or web address] Password [facility] 08/06/2017, 11:51 AM

## 2017-08-06 NOTE — Progress Notes (Signed)
Pt unable to tolerate CPAP any longer due to dry mouth.  Even with humidity on CPAP.  Pt removed from machine and placed on 40% venti mask with sats of 98%.

## 2017-08-06 NOTE — Progress Notes (Addendum)
Name: Glenn Beck MRN: [id number] DOB: 02/10/1945    ADMISSION DATE:  08/01/2017 CONSULTATION DATE:  08/05/2017  REFERRING MD : Tyrell Antonio  CHIEF COMPLAINT:  Nocturnal desaturations  BRIEF PATIENT DESCRIPTION:  72 yo male former smoker presented to [facility] with dyspnea and chest discomfort.  Was being tx for leg cellulitis and had increasing leg swelling.  Found to have lt leg DVT and PE.  CT chest also showed changes of emphysema.  He reports snoring, sleep disruption, waking up feeling choked, and daytime sleepiness.  Has hx of HTN and CAD.  Noted to have persistent hypoxia night > day.    He quit smoking in 2001 and used to smoke as much as he could.  Had pneumonia years ago.  No hx of TB.  From [place].  Retired, no occupational exposures.  Never in the [employer].  No prior hx of thromboembolic disease.   SIGNIFICANT EVENTS  Admission 08/01/2017 for PE confirmed per CT at [place]:  08/01/2017>> CT angio outside facility: Large right side pulmonary embolus extending from the right pulmonary artery into all lobar branches and multiple segmental branches within each lobe. No CT evidence of right heart strain. Emphysema noted upper lobes  08/02/2017>> Vascular US + for DVT (  acute DVT in the Femoral vein, Popliteal vein, and Posterior Tibial veins.)  05/01/4079 Echo Systolic function was   normal. The estimated ejection fraction was in the range of 50%   to 55%. Although no diagnostic regional wall motion abnormality   was identified, this possibility cannot be completely excluded on   the basis of this study. - Aortic valve: Transvalvular velocity was within the normal range.   There was no stenosis. There was no regurgitation. - Mitral valve: Calcified annulus. There was no significant   regurgitation. Valve area by pressure half-time: 1.45 cm^2. - Right ventricle: Systolic function was normal. - Atrial septum: No defect or patent foramen ovale was  identified. - Tricuspid valve: There was trivial regurgitation. - Pulmonic valve: There was no significant regurgitation. Impressions: - Technically difficult study, but no significant abnormalities   noted.    SUBJECTIVE:  Feeling better.  Wore CPAP only briefly overnight then could not tolerate r/t dry mouth  (despite humidity on CPAP).   VITAL SIGNS: Temp:  [97.3 F (36.3 C)-98.1 F (36.7 C)] 98 F (36.7 C) (07/24 0803) Pulse Rate:  [55-64] 59 (07/24 0803) Resp:  [17-21] 21 (07/24 0803) BP: (104-128)/(62-92) 112/92 (07/24 0803) SpO2:  [95 %-99 %] 96 % (07/24 0803)  PHYSICAL EXAMINATION: General:  wdwn male, NAD  HEENT: MM pink/moist Neuro: sleeping but easily arouses, MAE  CV: s1s2 rrr, no m/r/g PULM: resps even/non-labored on RA  GI: soft, non-tender Extremities: warm/dry, no edema, BLE cellulitis  Skin: no rashes or lesions   Recent Labs  Lab 08/02/17 0219 08/03/17 0652 08/06/17 0315  NA 142 140 143  K 4.0 4.0 5.3*  CL 104 104 105  CO2 28 27 33*  BUN 17 13 15   CREATININE 1.01 1.01 0.89  GLUCOSE 96 93 98   Recent Labs  Lab 08/04/17 0317 08/05/17 0144 08/06/17 0315  HGB 14.8 15.2 15.5  HCT 46.2 46.8 48.9  WBC 8.6 9.1 8.9  PLT 199 212 210   No results found.  ASSESSMENT / PLAN:  Acute hypoxic respiratory failure - from PE, presumed COPD with emphysema, and presumed OSA.  Improving.  ?OSA - snoring with sleep disruption, daytime sleepiness. Tolerated CPAP only briefly overnight.  Acute PE with LLE DVT  Presumed COPD  PLAN -  Supplemental oxygen to keep SpO2 > 92% Continue heparin  Defer to primary for transition to oral anticoag.  outpt PFTs  Continue attempts at qhs CPAP while in hospital  Will need outpt sleep study if willing to attempt CPAP  Has outpt f/u 8/5 PRN BD  Ambulatory desat prior to d/c   LLE cellulitis  PLAN -  abx per primary    PCCM signing off. Will f/u as outpt.  Please call if needed sooner.    Nickolas Madrid,  NP 08/06/2017  9:23 AM Pager: [phone] or [phone]  Attending Note:  72 year old male with the probable diagnosis of OSA who did not tolerate CPAP overnight.  On exam, he desaturated while asleep to 50% off CPAP.  Lungs are clear to auscultation.  I reviewed CXR, low lung volumes noted.  Discussed with PCCM-NP.  Hypoxemia:  - Titrate O2 for sat of 88-92%  - May need an ambulatory desaturation prior to discharge for home O2.  OSA:  - Sleep study as outpatient.  - Will need a titration study.  CPAP non-compliance:  - Spoke with RN and RT, will attempt a nasal mask or prongs  PCCM will sign off, please call back if needed.  Patient seen and examined, agree with above note.  I dictated the care and orders written for this patient under my direction.  Rush Farmer, [place]

## 2017-08-06 NOTE — Evaluation (Signed)
Physical Therapy Evaluation Patient Details Name: Glenn Beck MRN: [id number] DOB: 08-02-1945 Today's Date: 08/06/2017   History of Present Illness  [name] a 72 y.o.malewith a known history of CAD status post MI, GERD, anxiety and arthritis, asthma, bronchitis, hypertension hypothyroidism presented to the emergency department at [facility] with complaint of chest pressure and shortness of breath and dyspnea on exertion for the past 5 days. He has been taking Augmentin, doxycycline and prednisone for lower extremely cellulitis for the past 10 days..In the emergency department he was found to have a significant pulmonary embolism.  Clinical Impression  Pt admitted with above diagnosis. Pt currently with functional limitations due to the deficits listed below (see PT Problem List). Pt was able to ambulate in hallway with min guard assist. Needed standing rest breaks.  Talked with pt that he would be safer to use his rollator at home and pt agrees.  Pt sats on RA with activity >90%.  Will follow acutely. Pt will benefit from skilled PT to increase their independence and safety with mobility to allow discharge to the venue listed below.      Follow Up Recommendations Home health PT;Supervision - Intermittent    Equipment Recommendations  None recommended by PT    Recommendations for Other Services       Precautions / Restrictions Precautions Precautions: Fall Restrictions Weight Bearing Restrictions: No      Mobility  Bed Mobility Overal bed mobility: Independent                Transfers Overall transfer level: Independent                  Ambulation/Gait Ambulation/Gait assistance: Min guard;Min assist;+2 safety/equipment Gait Distance (Feet): 180 Feet Assistive device: None Gait Pattern/deviations: Step-through pattern;Decreased stride length;Wide base of support;Antalgic;Drifts right/left   Gait velocity interpretation: 1.31 - 2.62  ft/sec, indicative of limited community ambulator General Gait Details: Pt at times needed standing rest breaks and needed to stop and lean against rail or wall.  Encouarged pt to use rollator at home prn for safety and comfort.  no LOB however pt does fatigue.   Stairs            Wheelchair Mobility    Modified Rankin (Stroke Patients Only)       Balance Overall balance assessment: Needs assistance Sitting-balance support: No upper extremity supported;Feet supported Sitting balance-Leahy Scale: Good     Standing balance support: No upper extremity supported;During functional activity Standing balance-Leahy Scale: Fair Standing balance comment: can stand statically without UE support                             Pertinent Vitals/Pain Pain Assessment: No/denies pain    Home Living Family/patient expects to be discharged to:: Private residence Living Arrangements: Alone Available Help at Discharge: Family;Available PRN/intermittently(brother in law  and sister in law live close by) Type of Home: House Home Access: Stairs to enter Entrance Stairs-Rails: None Entrance Stairs-Number of Steps: 1 Home Layout: One level Home Equipment: Cane - single point;Walker - 4 wheels;Shower seat;Grab bars - toilet;Grab bars - tub/shower      Prior Function Level of Independence: Independent         Comments: has equipment but did not use it per pt     Hand Dominance   Dominant Hand: Right    Extremity/Trunk Assessment   Upper Extremity Assessment Upper Extremity Assessment: Defer to OT evaluation  Lower Extremity Assessment Lower Extremity Assessment: Generalized weakness    Cervical / Trunk Assessment Cervical / Trunk Assessment: Normal  Communication   Communication: No difficulties  Cognition Arousal/Alertness: Awake/alert Behavior During Therapy: WFL for tasks assessed/performed Overall Cognitive Status: Within Functional Limits for tasks  assessed                                        General Comments      Exercises     Assessment/Plan    PT Assessment Patient needs continued PT services  PT Problem List Decreased activity tolerance;Decreased balance;Decreased mobility;Decreased knowledge of use of DME;Decreased safety awareness;Decreased knowledge of precautions;Cardiopulmonary status limiting activity       PT Treatment Interventions DME instruction;Gait training;Functional mobility training;Therapeutic activities;Therapeutic exercise;Balance training;Patient/family education    PT Goals (Current goals can be found in the Care Plan section)  Acute Rehab PT Goals Patient Stated Goal: to get home and back to normal PT Goal Formulation: With patient Time For Goal Achievement: 08/20/17 Potential to Achieve Goals: Good    Frequency Min 3X/week   Barriers to discharge        Co-evaluation               AM-PAC PT "6 Clicks" Daily Activity  Outcome Measure Difficulty turning over in bed (including adjusting bedclothes, sheets and blankets)?: None Difficulty moving from lying on back to sitting on the side of the bed? : None Difficulty sitting down on and standing up from a chair with arms (e.g., wheelchair, bedside commode, etc,.)?: None Help needed moving to and from a bed to chair (including a wheelchair)?: A Little Help needed walking in hospital room?: A Little Help needed climbing 3-5 steps with a railing? : A Little 6 Click Score: 21    End of Session Equipment Utilized During Treatment: Gait belt Activity Tolerance: Patient limited by fatigue Patient left: in chair;with call bell/phone within reach Nurse Communication: Mobility status PT Visit Diagnosis: Muscle weakness (generalized) (M62.81)    Time: 1037-1100 PT Time Calculation (min) (ACUTE ONLY): 23 min   Charges:   PT Evaluation $PT Eval Moderate Complexity: 1 Mod PT Treatments $Gait Training: 8-22 mins   PT G  Codes:        Glenn Beck,PT Acute Rehabilitation [phone] (pager)   Glenn Beck 08/06/2017, 1:56 PM

## 2017-08-07 LAB — CBC
HCT: 47.6 % (ref 39.0–52.0)
Hemoglobin: 15 g/dL (ref 13.0–17.0)
MCH: 31.1 pg (ref 26.0–34.0)
MCHC: 31.5 g/dL (ref 30.0–36.0)
MCV: 98.6 fL (ref 78.0–100.0)
Platelets: 228 10*3/uL (ref 150–400)
RBC: 4.83 MIL/uL (ref 4.22–5.81)
RDW: 13.1 % (ref 11.5–15.5)
WBC: 9.5 10*3/uL (ref 4.0–10.5)

## 2017-08-07 MED ORDER — ENSURE ENLIVE PO LIQD: 237.0000 mL | Freq: Two times a day (BID) | ORAL | 12 refills | Status: AC

## 2017-08-07 MED ORDER — APIXABAN 5 MG PO TABS: 10.0000 mg | ORAL_TABLET | Freq: Two times a day (BID) | ORAL | 0 refills | Status: DC

## 2017-08-07 MED ORDER — APIXABAN 5 MG PO TABS: 5.0000 mg | ORAL_TABLET | Freq: Two times a day (BID) | ORAL | 1 refills | Status: AC

## 2017-08-07 NOTE — Care Management Note (Addendum)
Case Management Note  Patient Details  Name: NAYEF COLLEGE MRN: [id number] Date of Birth: Jun 19, 1945  Subjective/Objective:  From home alone, presents with PE, and DVT right femoral, popliteal and tibial veins on heparin drip, awaiting benefit check for eliquis and xarelto.     7/24 Tomi Bamberger RN, BSN - NCM gave patient the 30 day free coupon card for eliquis and informed him of his co pay amt of 40.00 for refills.  He states MD said he will be dc home tomorrow, getting one more day of iv abx for cellulitis    7/25 Tomi Bamberger RN, BSN - for dc today, will need HHPT, HHRN and home oxygen, offered choice, patient would like to work with [facility], and [place]. Referral given to [facility] with [facility] and Dorian Pod with [facility].                   Action/Plan: DC home with home oxygen and HH services.  Expected Discharge Date:  08/07/17               Expected Discharge Plan:  [place]  In-House Referral:     Discharge planning Services  CM Consult  Post Acute Care Choice:  Durable Medical Equipment, Home Health Choice offered to:  Patient  DME Arranged:  Oxygen DME Agency:  Other - Comment([employer])  HH Arranged:  PT, RN [place] Agency:  [facility]  Status of Service:  Completed, signed off  If discussed at [place] of Stay Meetings, dates discussed:    Additional Comments:  Zenon Mayo, RN 08/07/2017, 10:05 AM

## 2017-08-07 NOTE — Discharge Summary (Signed)
Physician Discharge Summary  JAAZIEL PEATROSS [id number]:[id number] DOB: 01-13-1946 DOA: 08/01/2017  PCP: Cher Nakai, MD  Admit date: 08/01/2017 Discharge date: 08/07/2017  Admitted From: Home  Disposition:  Home   Recommendations for Outpatient Follow-up:  1. Follow up with PCP in 1-2 weeks 2. Please obtain BMP/CBC in one week 3. Needs sleep study 4. Follow resolution of cellulitis    Discharge Condition: Stable.  CODE STATUS: Full code Diet recommendation: Heart Healthy   Brief/Interim Summary:  Brief Narrative: [name] a 72 y.o.malewith a known history of CAD status post MI, GERD, anxiety and arthritis, asthma, bronchitis, hypertension hypothyroidism presented to the emergency department at [facility] with complaint of chest pressure and shortness of breath and dyspnea on exertion for the past 5 days. He has been taking Augmentin, doxycycline and prednisone for lower extremely cellulitis for the past 10 days..In the emergency department he was found to have a significant pulmonary embolism.  Of note he has recently been prescribed testosterone gel topically.  Otherwise there has been no change in status. Patient has been taking medication as prescribed and there has been no recent change in medication or diet. There has been no recent illness, hospitalizations, travel or sick contacts.   EMS/ED Course:Patient was found a large pulmonary embolism for which hereceivedheparin. Medical admission has been requested for further management ofpulmonaryembolism.    Assessment & Plan:   Active Problems:   Pulmonary embolism (HCC)  1-Pulmonary embolism; large right side. Acute hypoxic Respiratory Failure.  He was found to have large right side PE, SBP initially in the 100 range. He was started on heparin Gtt. His hypoxemia was worse at night, de-sats in the lows 70 %. He required oxygen 6 L. NBM. Subsequently BP increased. He was also diagnosed with LE DVT   -CT angio outside facility: Large right side pulmonary embolus extending from the right pulmonary artery into all lobar branches and multiple segmental branches within each lobe. No CT evidence of right heart strain.  -No further hemoptysis.  -troponin negative.  -Doppler L E; Positive for DVT right femoral, popliteal and tibial veins.  -He has been on heparin gtt for 72 hours, transition to eliquis 7-23 Stable.    2-Cellulitis LE, Bilateral.  Bilateral LE with redness, small open wound left.  Continue with Vancomycin./ fail oral antibiotics.  Treated with 5 days of IV vancomycin   3-DVT Right lower extremity; femoral, popliteal and tibial vein;  Continue with heparin gtt On eliquis   COPD; chronic hypoxic respiratory failure.  Hypoxemia at HS, suspect related to sleep apnea. Pulmonary consulted. Will try CPAP. Patient to follow with them for sleep study. Will ask CM to help arrange home CPAP./  Continue with nebulizer PRN.  Will need home oxygen for HS> home oxygen has been arrange,   History of CAD;  Continue with aspirin, metoprolol.   History on chronic pain;  On Vicodin  and robaxin.   HTN;  Continue with metoprolol with holder parameter for hypotension.   Hypothyroidism;  Continue with synthroid.       Discharge Diagnoses:  Active Problems:   Pulmonary embolism (HCC)   OSA (obstructive sleep apnea)   CPAP use counseling   Hypoxemia    Discharge Instructions  Discharge Instructions    Diet - low sodium heart healthy   Complete by:  As directed    Increase activity slowly   Complete by:  As directed      Allergies as of 08/07/2017  Reactions   Nasacort [triamcinolone] Hives   Novocain [procaine] Rash   Other Rash   Reaction to unknown depression medication      Medication List    STOP taking these medications   amoxicillin-clavulanate 875-125 MG tablet Commonly known as:  AUGMENTIN   aspirin EC 325 MG tablet   doxycycline 100  MG tablet Commonly known as:  VIBRA-TABS   methocarbamol 500 MG tablet Commonly known as:  ROBAXIN   predniSONE 10 MG tablet Commonly known as:  DELTASONE     TAKE these medications   albuterol 108 (90 Base) MCG/ACT inhaler Commonly known as:  PROVENTIL HFA;VENTOLIN HFA Inhale 2 puffs into the lungs every 6 (six) hours as needed for wheezing or shortness of breath.   apixaban 5 MG Tabs tablet Commonly known as:  ELIQUIS Take 2 tablets (10 mg total) by mouth 2 (two) times daily.   apixaban 5 MG Tabs tablet Commonly known as:  ELIQUIS Take 1 tablet (5 mg total) by mouth 2 (two) times daily. Start taking on:  08/12/2017   colchicine 0.6 MG tablet Take 0.6 mg by mouth daily as needed (gout attack).   esomeprazole 20 MG capsule Commonly known as:  NEXIUM Take 20 mg by mouth daily as needed (acid reflux).   feeding supplement (ENSURE ENLIVE) Liqd Take 237 mLs by mouth 2 (two) times daily between meals.   HYDROcodone-acetaminophen 10-325 MG tablet Commonly known as:  NORCO Take 1 tablet by mouth 3 (three) times daily as needed (pain). for pain   ipratropium-albuterol 0.5-2.5 (3) MG/3ML Soln Commonly known as:  DUONEB Take 3 mLs by nebulization 4 (four) times daily as needed (shortness of breath/wheezing).   latanoprost 0.005 % ophthalmic solution Commonly known as:  XALATAN Place 1 drop into both eyes at bedtime.   levothyroxine 200 MCG tablet Commonly known as:  SYNTHROID, LEVOTHROID Take 200 mcg by mouth daily before breakfast.   loratadine 10 MG tablet Commonly known as:  CLARITIN Take 10 mg by mouth daily as needed (seasonal allergies).   metoprolol succinate 100 MG 24 hr tablet Commonly known as:  TOPROL-XL Take 100 mg by mouth daily.   tamsulosin 0.4 MG Caps capsule Commonly known as:  FLOMAX Take 0.4 mg by mouth daily as needed (infrequent urination).   triazolam 0.25 MG tablet Commonly known as:  HALCION Take 0.125 mg by mouth at bedtime.             Durable Medical Equipment  (From admission, onward)        Start     Ordered   08/07/17 0850  For home use only DME oxygen  Once    Question Answer Comment  Mode or (Route) Mask   Liters per Minute 6   Frequency Only at night (stationary unit needed)   Oxygen delivery system Gas      08/07/17 0849     Follow-up Information    Lauraine Rinne, NP Follow up on 08/18/2017.   Specialty:  Pulmonary Disease Why:  Appointment is at 10:15 am Please arrive by 10 am. Contact information: [address] [place] [address] [phone]          Allergies  Allergen Reactions  . Nasacort [Triamcinolone] Hives  . Novocain [Procaine] Rash  . Other Rash    Reaction to unknown depression medication    Consultations: CCM  Procedures/Studies:  No results found. ECHO; no evidence of right side heart failure    Subjective: He is breathing  better. Denies chest pain   Discharge Exam: Vitals:   08/07/17 0335 08/07/17 0736  BP: 119/69 127/80  Pulse: (!) 54 (!) 53  Resp: 17 18  Temp: 97.6 F (36.4 C) (!) 97.4 F (36.3 C)  SpO2: 99% 94%   Vitals:   08/07/17 0000 08/07/17 0046 08/07/17 0335 08/07/17 0736  BP: 121/65  119/69 127/80  Pulse:  (!) 53 (!) 54 (!) 53  Resp:  18 17 18   Temp:   97.6 F (36.4 C) (!) 97.4 F (36.3 C)  TempSrc:   Oral Oral  SpO2: 95% 98% 99% 94%  Weight:      Height:        General: Pt is alert, awake, not in acute distress Cardiovascular: RRR, S1/S2 +, no rubs, no gallops Respiratory: CTA bilaterally, no wheezing, no rhonchi Abdominal: Soft, NT, ND, bowel sounds + Extremities: no edema, no cyanosis    The results of significant diagnostics from this hospitalization (including imaging, microbiology, ancillary and laboratory) are listed below for reference.     Microbiology: Recent Results (from the past 240 hour(s))  MRSA PCR Screening     Status: None   Collection Time: 08/05/17  6:05 AM  Result Value Ref Range Status    MRSA by PCR NEGATIVE NEGATIVE Final    Comment:        The GeneXpert MRSA Assay (FDA approved for NASAL specimens only), is one component of a comprehensive MRSA colonization surveillance program. It is not intended to diagnose MRSA infection nor to guide or monitor treatment for MRSA infections. Performed at [facility], [place] [address], [place], [place] [address]      Labs: BNP (last 3 results) No results for input(s): BNP in the last 8760 hours. Basic Metabolic Panel: Recent Labs  Lab 08/02/17 0219 08/03/17 0652 08/06/17 0315  NA 142 140 143  K 4.0 4.0 5.3*  CL 104 104 105  CO2 28 27 33*  GLUCOSE 96 93 98  BUN 17 13 15   CREATININE 1.01 1.01 0.89  CALCIUM 8.9 8.3* 9.2   Liver Function Tests: No results for input(s): AST, ALT, ALKPHOS, BILITOT, PROT, ALBUMIN in the last 168 hours. No results for input(s): LIPASE, AMYLASE in the last 168 hours. No results for input(s): AMMONIA in the last 168 hours. CBC: Recent Labs  Lab 08/03/17 0652 08/04/17 0317 08/05/17 0144 08/06/17 0315 08/07/17 0243  WBC 8.1 8.6 9.1 8.9 9.5  HGB 14.9 14.8 15.2 15.5 15.0  HCT 46.7 46.2 46.8 48.9 47.6  MCV 98.3 98.5 98.5 99.6 98.6  PLT 189 199 212 210 228   Cardiac Enzymes: Recent Labs  Lab 08/02/17 0557 08/02/17 1735 08/03/17 0019 08/03/17 0652  TROPONINI 0.03* <0.03 <0.03 <0.03   BNP: Invalid input(s): POCBNP CBG: No results for input(s): GLUCAP in the last 168 hours. D-Dimer No results for input(s): DDIMER in the last 72 hours. Hgb A1c No results for input(s): HGBA1C in the last 72 hours. Lipid Profile No results for input(s): CHOL, HDL, LDLCALC, TRIG, CHOLHDL, LDLDIRECT in the last 72 hours. Thyroid function studies No results for input(s): TSH, T4TOTAL, T3FREE, THYROIDAB in the last 72 hours.  Invalid input(s): FREET3 Anemia work up No results for input(s): VITAMINB12, FOLATE, FERRITIN, TIBC, IRON, RETICCTPCT in the last 72 hours. Urinalysis    Component  Value Date/Time   COLORURINE YELLOW 12/16/2014 [place] 12/16/2014 1224   LABSPEC 1.011 12/16/2014 1224   PHURINE 7.5 12/16/2014 1224   GLUCOSEU NEGATIVE 12/16/2014  [place] 12/16/2014 [place] 12/16/2014 [place] 12/16/2014 1224   PROTEINUR NEGATIVE 12/16/2014 1224   NITRITE NEGATIVE 12/16/2014 1224   LEUKOCYTESUR SMALL (A) 12/16/2014 1224   Sepsis Labs Invalid input(s): PROCALCITONIN,  WBC,  LACTICIDVEN Microbiology Recent Results (from the past 240 hour(s))  MRSA PCR Screening     Status: None   Collection Time: 08/05/17  6:05 AM  Result Value Ref Range Status   MRSA by PCR NEGATIVE NEGATIVE Final    Comment:        The GeneXpert MRSA Assay (FDA approved for NASAL specimens only), is one component of a comprehensive MRSA colonization surveillance program. It is not intended to diagnose MRSA infection nor to guide or monitor treatment for MRSA infections. Performed at [facility], [place] [address] [place], [place] [address]      Time coordinating discharge: 35 minutes.   SIGNED:   Elmarie Shiley, MD  [facility] 08/07/2017, 9:00 AM Pager [phone]  If 7PM-7AM, please contact night-coverage [email or web address] Password [email or web address]

## 2017-08-07 NOTE — Consult Note (Signed)
            [facility] CM Primary Care Navigator  08/07/2017  Glenn Beck [birth year] [id number]   Seenpatientat the bedside toidentify possible discharge needs. Patient reports having "shortness of breath" and was found to have a significant pulmonary embolism thathad ledto thisadmission. Patient has been taking Augmentin, Doxycycline and Prednisone for lower extremity cellulitis.  Patientendorses Dr.Keung Truman Hayward with [facility] as his primary care provider.   [facility] [place] [facility].  Patienthas beenmanaginghisownmedicationsat homestraight our of the containers.  Patientstatesthathe was driving prior to this admission, but brother in-law Mortimer Fries) or sister in-law Vaughan Basta) will be able to provide transportation to hisdoctors' appointments after discharge.  Patient reportsliving alone and independent with self care. His sister in-law and brother in-law who live across him will be able to assist with his needs at home, as stated.  Anticipated plan for dischargeis homewith home health services per therapy recommendation. Patient will be going home with oxygen to use for hours of sleep.  Patientvoiced understandingto callprimarycare provider's office whenhereturnshomefor a post discharge follow-upvisitwithin1- 2 weeksor sooner if needs arise.Patient letter (with PCP's contact number) was provided asareminder.   Discussed with patientregarding [facility] CM services available for health managementandresourcesat homebuthe denies any needs or concerns at this time. Patient verbalized being able to manage so far and has no pressing health issues bothering him for now.   He verbalizedunderstandingof needto seekreferral from primary care provider to [facility] care management ifdeemed necessary and appropriatefor anyservicesin thefuture.  [facility] care management  information was provided for futureneeds thathemay have.  Patienthowever,verbally agreedand optedforEMMIcalls tofollow-up withhisrecoveryat home.   Referral made for [facility] General calls after discharge.    For additional questions please contact:  Edwena Felty A. Yajayra Feldt, BSN, RN-BC [facility] PRIMARY CARE Navigator Cell: [phone]

## 2017-08-07 NOTE — Progress Notes (Signed)
SATURATION QUALIFICATIONS: (This note is used to comply with regulatory documentation for home oxygen)  Patient Saturations on Room Air at Rest = 83%  Patient Saturations on Room Air while Ambulating = 90%  Patient Saturations on 6 Liters of oxygen while Ambulating = 100%  Please briefly explain why patient needs home oxygen:

## 2017-08-07 NOTE — Discharge Instructions (Signed)
Information on my medicine - ELIQUIS (apixaban)  This medication education was reviewed with me or my healthcare representative as part of my discharge preparation.  The pharmacist that spoke with me during my hospital stay was:  Emon Lance Kay, RPH  Why was Eliquis prescribed for you? Eliquis was prescribed to treat blood clots that may have been found in the veins of your legs (deep vein thrombosis) or in your lungs (pulmonary embolism) and to reduce the risk of them occurring again.  What do You need to know about Eliquis ? The starting dose is 10 mg (two 5 mg tablets) taken TWICE daily for the FIRST SEVEN (7) DAYS, then  the dose is reduced to ONE 5 mg tablet taken TWICE daily.  Eliquis may be taken with or without food.   Try to take the dose about the same time in the morning and in the evening. If you have difficulty swallowing the tablet whole please discuss with your pharmacist how to take the medication safely.  Take Eliquis exactly as prescribed and DO NOT stop taking Eliquis without talking to the doctor who prescribed the medication.  Stopping may increase your risk of developing a new blood clot.  Refill your prescription before you run out.  After discharge, you should have regular check-up appointments with your healthcare provider that is prescribing your Eliquis.    What do you do if you miss a dose? If a dose of ELIQUIS is not taken at the scheduled time, take it as soon as possible on the same day and twice-daily administration should be resumed. The dose should not be doubled to make up for a missed dose.  Important Safety Information A possible side effect of Eliquis is bleeding. You should call your healthcare provider right away if you experience any of the following: Bleeding from an injury or your nose that does not stop. Unusual colored urine (red or dark brown) or unusual colored stools (red or black). Unusual bruising for unknown reasons. A serious  fall or if you hit your head (even if there is no bleeding).  Some medicines may interact with Eliquis and might increase your risk of bleeding or clotting while on Eliquis. To help avoid this, consult your healthcare provider or pharmacist prior to using any new prescription or non-prescription medications, including herbals, vitamins, non-steroidal anti-inflammatory drugs (NSAIDs) and supplements.  This website has more information on Eliquis (apixaban): http://www.eliquis.com/eliquis/home  

## 2017-08-08 ENCOUNTER — Ambulatory Visit: Payer: Medicare Other | Admitting: Cardiology

## 2017-08-09 DIAGNOSIS — Z87891 Personal history of nicotine dependence: Secondary | ICD-10-CM | POA: Diagnosis not present

## 2017-08-09 DIAGNOSIS — I2510 Atherosclerotic heart disease of native coronary artery without angina pectoris: Secondary | ICD-10-CM | POA: Diagnosis not present

## 2017-08-09 DIAGNOSIS — G4700 Insomnia, unspecified: Secondary | ICD-10-CM | POA: Diagnosis not present

## 2017-08-09 DIAGNOSIS — I10 Essential (primary) hypertension: Secondary | ICD-10-CM | POA: Diagnosis not present

## 2017-08-09 DIAGNOSIS — Z96651 Presence of right artificial knee joint: Secondary | ICD-10-CM | POA: Diagnosis not present

## 2017-08-09 DIAGNOSIS — L03115 Cellulitis of right lower limb: Secondary | ICD-10-CM | POA: Diagnosis not present

## 2017-08-09 DIAGNOSIS — I82441 Acute embolism and thrombosis of right tibial vein: Secondary | ICD-10-CM | POA: Diagnosis not present

## 2017-08-09 DIAGNOSIS — M1990 Unspecified osteoarthritis, unspecified site: Secondary | ICD-10-CM | POA: Diagnosis not present

## 2017-08-09 DIAGNOSIS — I252 Old myocardial infarction: Secondary | ICD-10-CM | POA: Diagnosis not present

## 2017-08-09 DIAGNOSIS — Z79891 Long term (current) use of opiate analgesic: Secondary | ICD-10-CM | POA: Diagnosis not present

## 2017-08-09 DIAGNOSIS — I2699 Other pulmonary embolism without acute cor pulmonale: Secondary | ICD-10-CM | POA: Diagnosis not present

## 2017-08-09 DIAGNOSIS — G4733 Obstructive sleep apnea (adult) (pediatric): Secondary | ICD-10-CM | POA: Diagnosis not present

## 2017-08-09 DIAGNOSIS — I82411 Acute embolism and thrombosis of right femoral vein: Secondary | ICD-10-CM | POA: Diagnosis not present

## 2017-08-09 DIAGNOSIS — M4712 Other spondylosis with myelopathy, cervical region: Secondary | ICD-10-CM | POA: Diagnosis not present

## 2017-08-09 DIAGNOSIS — Z7901 Long term (current) use of anticoagulants: Secondary | ICD-10-CM | POA: Diagnosis not present

## 2017-08-09 DIAGNOSIS — I82431 Acute embolism and thrombosis of right popliteal vein: Secondary | ICD-10-CM | POA: Diagnosis not present

## 2017-08-09 DIAGNOSIS — G8929 Other chronic pain: Secondary | ICD-10-CM | POA: Diagnosis not present

## 2017-08-09 DIAGNOSIS — L03116 Cellulitis of left lower limb: Secondary | ICD-10-CM | POA: Diagnosis not present

## 2017-08-09 DIAGNOSIS — M109 Gout, unspecified: Secondary | ICD-10-CM | POA: Diagnosis not present

## 2017-08-09 DIAGNOSIS — Z9181 History of falling: Secondary | ICD-10-CM | POA: Diagnosis not present

## 2017-08-09 DIAGNOSIS — K5790 Diverticulosis of intestine, part unspecified, without perforation or abscess without bleeding: Secondary | ICD-10-CM | POA: Diagnosis not present

## 2017-08-09 DIAGNOSIS — E039 Hypothyroidism, unspecified: Secondary | ICD-10-CM | POA: Diagnosis not present

## 2017-08-09 DIAGNOSIS — Z7982 Long term (current) use of aspirin: Secondary | ICD-10-CM | POA: Diagnosis not present

## 2017-08-09 DIAGNOSIS — J449 Chronic obstructive pulmonary disease, unspecified: Secondary | ICD-10-CM | POA: Diagnosis not present

## 2017-08-11 DIAGNOSIS — G8929 Other chronic pain: Secondary | ICD-10-CM | POA: Diagnosis not present

## 2017-08-11 DIAGNOSIS — I2510 Atherosclerotic heart disease of native coronary artery without angina pectoris: Secondary | ICD-10-CM | POA: Diagnosis not present

## 2017-08-11 DIAGNOSIS — Z96651 Presence of right artificial knee joint: Secondary | ICD-10-CM | POA: Diagnosis not present

## 2017-08-11 DIAGNOSIS — M1990 Unspecified osteoarthritis, unspecified site: Secondary | ICD-10-CM | POA: Diagnosis not present

## 2017-08-11 DIAGNOSIS — I252 Old myocardial infarction: Secondary | ICD-10-CM | POA: Diagnosis not present

## 2017-08-11 DIAGNOSIS — Z87891 Personal history of nicotine dependence: Secondary | ICD-10-CM | POA: Diagnosis not present

## 2017-08-11 DIAGNOSIS — M109 Gout, unspecified: Secondary | ICD-10-CM | POA: Diagnosis not present

## 2017-08-11 DIAGNOSIS — I82441 Acute embolism and thrombosis of right tibial vein: Secondary | ICD-10-CM | POA: Diagnosis not present

## 2017-08-11 DIAGNOSIS — Z79891 Long term (current) use of opiate analgesic: Secondary | ICD-10-CM | POA: Diagnosis not present

## 2017-08-11 DIAGNOSIS — L03115 Cellulitis of right lower limb: Secondary | ICD-10-CM | POA: Diagnosis not present

## 2017-08-11 DIAGNOSIS — E039 Hypothyroidism, unspecified: Secondary | ICD-10-CM | POA: Diagnosis not present

## 2017-08-11 DIAGNOSIS — Z9181 History of falling: Secondary | ICD-10-CM | POA: Diagnosis not present

## 2017-08-11 DIAGNOSIS — L03116 Cellulitis of left lower limb: Secondary | ICD-10-CM | POA: Diagnosis not present

## 2017-08-11 DIAGNOSIS — J449 Chronic obstructive pulmonary disease, unspecified: Secondary | ICD-10-CM | POA: Diagnosis not present

## 2017-08-11 DIAGNOSIS — I10 Essential (primary) hypertension: Secondary | ICD-10-CM | POA: Diagnosis not present

## 2017-08-11 DIAGNOSIS — K5790 Diverticulosis of intestine, part unspecified, without perforation or abscess without bleeding: Secondary | ICD-10-CM | POA: Diagnosis not present

## 2017-08-11 DIAGNOSIS — G4700 Insomnia, unspecified: Secondary | ICD-10-CM | POA: Diagnosis not present

## 2017-08-11 DIAGNOSIS — I2699 Other pulmonary embolism without acute cor pulmonale: Secondary | ICD-10-CM | POA: Diagnosis not present

## 2017-08-11 DIAGNOSIS — I82411 Acute embolism and thrombosis of right femoral vein: Secondary | ICD-10-CM | POA: Diagnosis not present

## 2017-08-11 DIAGNOSIS — M4712 Other spondylosis with myelopathy, cervical region: Secondary | ICD-10-CM | POA: Diagnosis not present

## 2017-08-11 DIAGNOSIS — Z7901 Long term (current) use of anticoagulants: Secondary | ICD-10-CM | POA: Diagnosis not present

## 2017-08-11 DIAGNOSIS — I82431 Acute embolism and thrombosis of right popliteal vein: Secondary | ICD-10-CM | POA: Diagnosis not present

## 2017-08-11 DIAGNOSIS — Z7982 Long term (current) use of aspirin: Secondary | ICD-10-CM | POA: Diagnosis not present

## 2017-08-11 DIAGNOSIS — G4733 Obstructive sleep apnea (adult) (pediatric): Secondary | ICD-10-CM | POA: Diagnosis not present

## 2017-08-12 DIAGNOSIS — Z87891 Personal history of nicotine dependence: Secondary | ICD-10-CM | POA: Diagnosis not present

## 2017-08-12 DIAGNOSIS — Z7982 Long term (current) use of aspirin: Secondary | ICD-10-CM | POA: Diagnosis not present

## 2017-08-12 DIAGNOSIS — Z96651 Presence of right artificial knee joint: Secondary | ICD-10-CM | POA: Diagnosis not present

## 2017-08-12 DIAGNOSIS — Z79891 Long term (current) use of opiate analgesic: Secondary | ICD-10-CM | POA: Diagnosis not present

## 2017-08-12 DIAGNOSIS — Z9181 History of falling: Secondary | ICD-10-CM | POA: Diagnosis not present

## 2017-08-12 DIAGNOSIS — M109 Gout, unspecified: Secondary | ICD-10-CM | POA: Diagnosis not present

## 2017-08-12 DIAGNOSIS — I2510 Atherosclerotic heart disease of native coronary artery without angina pectoris: Secondary | ICD-10-CM | POA: Diagnosis not present

## 2017-08-12 DIAGNOSIS — M1990 Unspecified osteoarthritis, unspecified site: Secondary | ICD-10-CM | POA: Diagnosis not present

## 2017-08-12 DIAGNOSIS — M4712 Other spondylosis with myelopathy, cervical region: Secondary | ICD-10-CM | POA: Diagnosis not present

## 2017-08-12 DIAGNOSIS — G4733 Obstructive sleep apnea (adult) (pediatric): Secondary | ICD-10-CM | POA: Diagnosis not present

## 2017-08-12 DIAGNOSIS — I2699 Other pulmonary embolism without acute cor pulmonale: Secondary | ICD-10-CM | POA: Diagnosis not present

## 2017-08-12 DIAGNOSIS — Z7901 Long term (current) use of anticoagulants: Secondary | ICD-10-CM | POA: Diagnosis not present

## 2017-08-12 DIAGNOSIS — K5790 Diverticulosis of intestine, part unspecified, without perforation or abscess without bleeding: Secondary | ICD-10-CM | POA: Diagnosis not present

## 2017-08-12 DIAGNOSIS — I10 Essential (primary) hypertension: Secondary | ICD-10-CM | POA: Diagnosis not present

## 2017-08-12 DIAGNOSIS — I252 Old myocardial infarction: Secondary | ICD-10-CM | POA: Diagnosis not present

## 2017-08-12 DIAGNOSIS — G8929 Other chronic pain: Secondary | ICD-10-CM | POA: Diagnosis not present

## 2017-08-12 DIAGNOSIS — I82431 Acute embolism and thrombosis of right popliteal vein: Secondary | ICD-10-CM | POA: Diagnosis not present

## 2017-08-12 DIAGNOSIS — L03115 Cellulitis of right lower limb: Secondary | ICD-10-CM | POA: Diagnosis not present

## 2017-08-12 DIAGNOSIS — I82411 Acute embolism and thrombosis of right femoral vein: Secondary | ICD-10-CM | POA: Diagnosis not present

## 2017-08-12 DIAGNOSIS — I82441 Acute embolism and thrombosis of right tibial vein: Secondary | ICD-10-CM | POA: Diagnosis not present

## 2017-08-12 DIAGNOSIS — E039 Hypothyroidism, unspecified: Secondary | ICD-10-CM | POA: Diagnosis not present

## 2017-08-12 DIAGNOSIS — L03116 Cellulitis of left lower limb: Secondary | ICD-10-CM | POA: Diagnosis not present

## 2017-08-12 DIAGNOSIS — J449 Chronic obstructive pulmonary disease, unspecified: Secondary | ICD-10-CM | POA: Diagnosis not present

## 2017-08-12 DIAGNOSIS — G4700 Insomnia, unspecified: Secondary | ICD-10-CM | POA: Diagnosis not present

## 2017-08-13 DIAGNOSIS — I10 Essential (primary) hypertension: Secondary | ICD-10-CM | POA: Diagnosis not present

## 2017-08-13 DIAGNOSIS — Z9181 History of falling: Secondary | ICD-10-CM | POA: Diagnosis not present

## 2017-08-13 DIAGNOSIS — M1990 Unspecified osteoarthritis, unspecified site: Secondary | ICD-10-CM | POA: Diagnosis not present

## 2017-08-13 DIAGNOSIS — E039 Hypothyroidism, unspecified: Secondary | ICD-10-CM | POA: Diagnosis not present

## 2017-08-13 DIAGNOSIS — I252 Old myocardial infarction: Secondary | ICD-10-CM | POA: Diagnosis not present

## 2017-08-13 DIAGNOSIS — I82441 Acute embolism and thrombosis of right tibial vein: Secondary | ICD-10-CM | POA: Diagnosis not present

## 2017-08-13 DIAGNOSIS — G8929 Other chronic pain: Secondary | ICD-10-CM | POA: Diagnosis not present

## 2017-08-13 DIAGNOSIS — I2510 Atherosclerotic heart disease of native coronary artery without angina pectoris: Secondary | ICD-10-CM | POA: Diagnosis not present

## 2017-08-13 DIAGNOSIS — G4700 Insomnia, unspecified: Secondary | ICD-10-CM | POA: Diagnosis not present

## 2017-08-13 DIAGNOSIS — Z87891 Personal history of nicotine dependence: Secondary | ICD-10-CM | POA: Diagnosis not present

## 2017-08-13 DIAGNOSIS — G4733 Obstructive sleep apnea (adult) (pediatric): Secondary | ICD-10-CM | POA: Diagnosis not present

## 2017-08-13 DIAGNOSIS — M109 Gout, unspecified: Secondary | ICD-10-CM | POA: Diagnosis not present

## 2017-08-13 DIAGNOSIS — I2699 Other pulmonary embolism without acute cor pulmonale: Secondary | ICD-10-CM | POA: Diagnosis not present

## 2017-08-13 DIAGNOSIS — K5790 Diverticulosis of intestine, part unspecified, without perforation or abscess without bleeding: Secondary | ICD-10-CM | POA: Diagnosis not present

## 2017-08-13 DIAGNOSIS — J449 Chronic obstructive pulmonary disease, unspecified: Secondary | ICD-10-CM | POA: Diagnosis not present

## 2017-08-13 DIAGNOSIS — Z7982 Long term (current) use of aspirin: Secondary | ICD-10-CM | POA: Diagnosis not present

## 2017-08-13 DIAGNOSIS — L03115 Cellulitis of right lower limb: Secondary | ICD-10-CM | POA: Diagnosis not present

## 2017-08-13 DIAGNOSIS — Z96651 Presence of right artificial knee joint: Secondary | ICD-10-CM | POA: Diagnosis not present

## 2017-08-13 DIAGNOSIS — I82431 Acute embolism and thrombosis of right popliteal vein: Secondary | ICD-10-CM | POA: Diagnosis not present

## 2017-08-13 DIAGNOSIS — Z7901 Long term (current) use of anticoagulants: Secondary | ICD-10-CM | POA: Diagnosis not present

## 2017-08-13 DIAGNOSIS — Z79891 Long term (current) use of opiate analgesic: Secondary | ICD-10-CM | POA: Diagnosis not present

## 2017-08-13 DIAGNOSIS — M4712 Other spondylosis with myelopathy, cervical region: Secondary | ICD-10-CM | POA: Diagnosis not present

## 2017-08-13 DIAGNOSIS — I82411 Acute embolism and thrombosis of right femoral vein: Secondary | ICD-10-CM | POA: Diagnosis not present

## 2017-08-13 DIAGNOSIS — L03116 Cellulitis of left lower limb: Secondary | ICD-10-CM | POA: Diagnosis not present

## 2017-08-15 ENCOUNTER — Encounter: Payer: Self-pay | Admitting: Cardiology

## 2017-08-15 ENCOUNTER — Other Ambulatory Visit: Payer: Self-pay

## 2017-08-15 ENCOUNTER — Ambulatory Visit: Payer: Medicare Other | Admitting: Cardiology

## 2017-08-15 VITALS — BP 110/78 | HR 57 | Ht 70.0 in | Wt 280.0 lb

## 2017-08-15 DIAGNOSIS — I10 Essential (primary) hypertension: Secondary | ICD-10-CM | POA: Diagnosis not present

## 2017-08-15 DIAGNOSIS — E538 Deficiency of other specified B group vitamins: Secondary | ICD-10-CM | POA: Diagnosis not present

## 2017-08-15 DIAGNOSIS — E039 Hypothyroidism, unspecified: Secondary | ICD-10-CM | POA: Diagnosis not present

## 2017-08-15 DIAGNOSIS — I2510 Atherosclerotic heart disease of native coronary artery without angina pectoris: Secondary | ICD-10-CM | POA: Diagnosis not present

## 2017-08-15 DIAGNOSIS — E559 Vitamin D deficiency, unspecified: Secondary | ICD-10-CM | POA: Diagnosis not present

## 2017-08-15 DIAGNOSIS — Z86711 Personal history of pulmonary embolism: Secondary | ICD-10-CM | POA: Insufficient documentation

## 2017-08-15 DIAGNOSIS — Z6841 Body Mass Index (BMI) 40.0 and over, adult: Secondary | ICD-10-CM

## 2017-08-15 DIAGNOSIS — I2699 Other pulmonary embolism without acute cor pulmonale: Secondary | ICD-10-CM | POA: Diagnosis not present

## 2017-08-15 DIAGNOSIS — G4733 Obstructive sleep apnea (adult) (pediatric): Secondary | ICD-10-CM

## 2017-08-15 DIAGNOSIS — Z7189 Other specified counseling: Secondary | ICD-10-CM

## 2017-08-15 HISTORY — DX: Personal history of pulmonary embolism: Z86.711

## 2017-08-15 LAB — BASIC METABOLIC PANEL
BUN/Creatinine Ratio: 14 (ref 10–24)
BUN: 12 mg/dL (ref 8–27)
CO2: 28 mmol/L (ref 20–29)
Calcium: 9.5 mg/dL (ref 8.6–10.2)
Chloride: 99 mmol/L (ref 96–106)
Creatinine, Ser: 0.87 mg/dL (ref 0.76–1.27)
GFR calc Af Amer: 98 mL/min/{1.73_m2} (ref >59)
GFR calc non Af Amer: 85 mL/min/{1.73_m2} (ref >59)
Glucose: 90 mg/dL (ref 65–99)
Potassium: 5 mmol/L (ref 3.5–5.2)
Sodium: 139 mmol/L (ref 134–144)

## 2017-08-15 MED ORDER — FUROSEMIDE 20 MG PO TABS: 20.0000 mg | ORAL_TABLET | Freq: Every day | ORAL | 3 refills | Status: DC

## 2017-08-15 MED ORDER — METOPROLOL SUCCINATE ER 50 MG PO TB24: 50.0000 mg | ORAL_TABLET | Freq: Every day | ORAL | 3 refills | Status: AC

## 2017-08-15 NOTE — Addendum Note (Signed)
Addended by: Aleatha Borer on: 08/15/2017 10:37 AM   Modules accepted: Orders

## 2017-08-15 NOTE — Progress Notes (Signed)
Cardiology Office Note:    Date:  08/15/2017   ID:  Glenn Beck, DOB 03-25-1945, MRN [id number]  PCP:  Glenn Nakai, MD  Cardiologist:  Glenn Lindau, MD   Referring MD: Glenn Nakai, MD    ASSESSMENT:    1. History of pulmonary embolism   2. Essential hypertension   3. CPAP use counseling   4. Morbid obesity with BMI of 40.0-44.9, adult ([place])   5. OSA (obstructive sleep apnea)    PLAN:    In order of problems listed above:  1. Primary prevention stressed with the patient.  Importance of compliance with diet and medications stressed and he vocalized understanding.  I discussed pulmonary embolism issues, anticoagulation in length benefits and potential risks and he vocalized understanding. 2. His blood pressure is stable.  He complains of pedal edema.  His heart rate is on the lower side so have discontinued his metoprolol and initiated him on metoprolol succinate 50 mg daily.  This is half dose of what he is taking now.  I have added furosemide 20 mg daily.  He will have a Chem-7 today and will be back in a week for a pulse blood pressure check and a Chem-7. 3. Sleep health issues were discussed.  Diet was discussed for obesity and this of obesity explained and he vocalized understanding.  He plans to diet to Beck weight aggressively. 4. Patient will be seen in follow-up appointment in 6 months or earlier if the patient has any concerns    Medication Adjustments/Labs and Tests Ordered: Current medicines are reviewed at length with the patient today.  Concerns regarding medicines are outlined above.  No orders of the defined types were placed in this encounter.  No orders of the defined types were placed in this encounter.    History of Present Illness:    Glenn Beck is a 72 y.o. male who is being seen today for the evaluation of recent pulmonary embolism at the request of Glenn Nakai, MD.  Patient is a pleasant 72 year old male.  He has past medical history of  essential hypertension and obesity.  He recently went to the hospital with shortness of breath and was found to have a significant sized pulmonary embolism.  DVT study was unremarkable.  Patient is now on Eliquis and is feeling better.  No chest pain orthopnea or PND.  He had chronic shortness of breath on exertion which is steady.  At the time of my evaluation, the patient is alert awake oriented and in no distress.  No history of syncope  Past Medical History:  Diagnosis Date  . Anxiety    takes Xanax daily as needed  . Arthritis    "all over my body"  . Asthma    bronchial  . Chronic back pain   . Complication of anesthesia    pt. reports swallowing has been difficult since having ACDF- 2016, has been eval. by ENT & testing done at [facility].   . Diverticulosis    takes Bentyl daily as needed  . GERD (gastroesophageal reflux disease)    takes Nexium daily as needed  . Glaucoma    uses eye drops  . Gout    on study medications for this  . Headache    r/t neck issues  . History of colon polyps    benign  . History of shingles   . Hypertension    takes Lisinopril and Metoprolol daily  . Hypothyroidism    takes Synthroid  daily  . Insomnia    takes Triazolam nightly as needed  . Joint pain   . Kidney infection    beint treated at present with Amoxicillin   . Myocardial infarction ([place]) >76yrs ago  . Pneumonia 74yrs ago  . Shortness of breath dyspnea    with exertion  . Swallowing difficulty    has been evaluated at [facility]. for this problem since Cervical Fusion    Past Surgical History:  Procedure Laterality Date  . ANTERIOR CERVICAL DECOMP/DISCECTOMY FUSION N/A 04/19/2014   Procedure: ANTERIOR CERVICAL DECOMPRESSION/DISCECTOMY FUSION CERVICAL THREE-FOUR CERVICAL FOUR-FIVE ,CERVICAL FIVE-SIX WITH INTERBODY PROTHESIS PLATING BONEGRAFT;  Surgeon: Glenn Lose, MD;  Location: [place] NEUR[facility] ORS;  Service: Neurosurgery;  Laterality: N/A;  . BACK SURGERY     x 2;fusion   . CARDIAC CATHETERIZATION  >22yrs ago   /w angioplasty  . COLONOSCOPY    . ESOPHAGOGASTRODUODENOSCOPY    . EYE SURGERY Bilateral   . HERNIA REPAIR Right    many yrs. ago   . KNEE ARTHROSCOPY Right   . TONSILLECTOMY    . TOTAL KNEE ARTHROPLASTY Right 12/26/2014   Procedure: TOTAL KNEE ARTHROPLASTY;  Surgeon: Glenn Huger, MD;  Location: [facility];  Service: Orthopedics;  Laterality: Right;    Current Medications: Current Meds  Medication Sig  . albuterol (PROVENTIL HFA;VENTOLIN HFA) 108 (90 BASE) MCG/ACT inhaler Inhale 2 puffs into the lungs every 6 (six) hours as needed for wheezing or shortness of breath.  Marland Kitchen apixaban (ELIQUIS) 5 MG TABS tablet Take 1 tablet (5 mg total) by mouth 2 (two) times daily.  Marland Kitchen esomeprazole (NEXIUM) 20 MG capsule Take 20 mg by mouth daily as needed (acid reflux).   . feeding supplement, ENSURE ENLIVE, (ENSURE ENLIVE) LIQD Take 237 mLs by mouth 2 (two) times daily between meals.  Marland Kitchen HYDROcodone-acetaminophen (NORCO) 10-325 MG tablet Take 1 tablet by mouth 3 (three) times daily as needed (pain). for pain  . ipratropium-albuterol (DUONEB) 0.5-2.5 (3) MG/3ML SOLN Take 3 mLs by nebulization 4 (four) times daily as needed (shortness of breath/wheezing).   Marland Kitchen latanoprost (XALATAN) 0.005 % ophthalmic solution Place 1 drop into both eyes at bedtime.  Marland Kitchen levothyroxine (SYNTHROID, LEVOTHROID) 200 MCG tablet Take 200 mcg by mouth daily before breakfast.  . metoprolol succinate (TOPROL-XL) 100 MG 24 hr tablet Take 100 mg by mouth daily.  . tamsulosin (FLOMAX) 0.4 MG CAPS capsule Take 0.4 mg by mouth daily as needed (infrequent urination).   . triazolam (HALCION) 0.25 MG tablet Take 0.125 mg by mouth at bedtime.      Allergies:   Ciprofloxacin; Nasacort [triamcinolone]; Novocain [procaine]; and Other   Social History   Socioeconomic History  . Marital status: Widowed    Spouse name: Not on file  . Number of children: Not on file  . Years of education: Not on file  .  Highest education level: Not on file  Occupational History  . Occupation: Regulatory affairs officer  Social Needs  . Financial resource strain: Patient refused  . Food insecurity:    Worry: Patient refused    Inability: Patient refused  . Transportation needs:    Medical: Patient refused    Non-medical: Patient refused  Tobacco Use  . Smoking status: Former Smoker    Packs/day: 1.50    Years: 40.00    Pack years: 60.00    Types: Cigarettes    Start date: 1961    Last attempt to quit: 2001    Years since quitting: 18.5  .  Smokeless tobacco: Former Systems developer  . Tobacco comment: quit smoking in 2001  Substance and Sexual Activity  . Alcohol use: No  . Drug use: No  . Sexual activity: Not Currently  Lifestyle  . Physical activity:    Days per week: Patient refused    Minutes per session: Patient refused  . Stress: Patient refused  Relationships  . Social connections:    Talks on phone: Patient refused    Gets together: Patient refused    Attends religious service: Patient refused    Active member of club or organization: Patient refused    Attends meetings of clubs or organizations: Patient refused    Relationship status: Patient refused  Other Topics Concern  . Not on file  Social History Narrative   No social documentation.     Family History: The patient's family history is not on file.  ROS:   Please see the history of present illness.    All other systems reviewed and are negative.  EKGs/Labs/Other Studies Reviewed:    The following studies were reviewed today: I reviewed [facility] from [facility] in detail and discussed with the patient.   Recent Labs: 08/06/2017: BUN 15; Creatinine, Ser 0.89; Potassium 5.3; Sodium 143 08/07/2017: Hemoglobin 15.0; Platelets 228  Recent Lipid Panel No results found for: CHOL, TRIG, HDL, CHOLHDL, VLDL, LDLCALC, LDLDIRECT  Physical Exam:    VS:  BP 110/78 (BP Location: Right Arm, Patient Position: Sitting, Cuff Size: Large)    Pulse (!) 57   Ht 5\' 10"  (1.778 m)   Wt 280 lb (127 kg)   SpO2 97%   BMI 40.18 kg/m     Wt Readings from Last 3 Encounters:  08/15/17 280 lb (127 kg)  08/02/17 275 lb 9.2 oz (125 kg)  12/26/14 283 lb (128.4 kg)     GEN: Patient is in no acute distress HEENT: Normal NECK: No JVD; No carotid bruits LYMPHATICS: No lymphadenopathy CARDIAC: S1 S2 regular, 2/6 systolic murmur at the apex. RESPIRATORY:  Clear to auscultation without rales, wheezing or rhonchi  ABDOMEN: Soft, non-tender, non-distended MUSCULOSKELETAL:  No edema; No deformity  SKIN: Warm and dry NEUROLOGIC:  Alert and oriented x 3 PSYCHIATRIC:  Normal affect    Signed, Glenn Lindau, MD  08/15/2017 10:25 AM    [place]

## 2017-08-15 NOTE — Patient Instructions (Addendum)
Medication Instructions:  Your physician has recommended you make the following change in your medication:  Decrease your Metoprolol to 50 mg daily Start Furosemide 20 mg daily  Labwork: Your physician recommends that you have lab work today: BMP  Testing/Procedures: None ordered  Follow-Up: Your physician recommends that you schedule a follow-up appointment in: 6 months with Dr. Geraldo Pitter 1 week nurse visit for Blood pressure, pulse and lab   Any Other Special Instructions Will Be Listed Below (If Applicable).     If you need a refill on your cardiac medications before your next appointment, please call your pharmacy.

## 2017-08-18 ENCOUNTER — Inpatient Hospital Stay: Payer: Medicare Other | Admitting: Pulmonary Disease

## 2017-08-18 ENCOUNTER — Other Ambulatory Visit: Payer: Self-pay

## 2017-08-18 DIAGNOSIS — I10 Essential (primary) hypertension: Secondary | ICD-10-CM

## 2017-08-19 DIAGNOSIS — I252 Old myocardial infarction: Secondary | ICD-10-CM | POA: Diagnosis not present

## 2017-08-19 DIAGNOSIS — Z7982 Long term (current) use of aspirin: Secondary | ICD-10-CM | POA: Diagnosis not present

## 2017-08-19 DIAGNOSIS — Z7901 Long term (current) use of anticoagulants: Secondary | ICD-10-CM | POA: Diagnosis not present

## 2017-08-19 DIAGNOSIS — Z96651 Presence of right artificial knee joint: Secondary | ICD-10-CM | POA: Diagnosis not present

## 2017-08-19 DIAGNOSIS — I2510 Atherosclerotic heart disease of native coronary artery without angina pectoris: Secondary | ICD-10-CM | POA: Diagnosis not present

## 2017-08-19 DIAGNOSIS — I82441 Acute embolism and thrombosis of right tibial vein: Secondary | ICD-10-CM | POA: Diagnosis not present

## 2017-08-19 DIAGNOSIS — Z9181 History of falling: Secondary | ICD-10-CM | POA: Diagnosis not present

## 2017-08-19 DIAGNOSIS — G4700 Insomnia, unspecified: Secondary | ICD-10-CM | POA: Diagnosis not present

## 2017-08-19 DIAGNOSIS — L03116 Cellulitis of left lower limb: Secondary | ICD-10-CM | POA: Diagnosis not present

## 2017-08-19 DIAGNOSIS — G4733 Obstructive sleep apnea (adult) (pediatric): Secondary | ICD-10-CM | POA: Diagnosis not present

## 2017-08-19 DIAGNOSIS — M109 Gout, unspecified: Secondary | ICD-10-CM | POA: Diagnosis not present

## 2017-08-19 DIAGNOSIS — Z79891 Long term (current) use of opiate analgesic: Secondary | ICD-10-CM | POA: Diagnosis not present

## 2017-08-19 DIAGNOSIS — Z87891 Personal history of nicotine dependence: Secondary | ICD-10-CM | POA: Diagnosis not present

## 2017-08-19 DIAGNOSIS — E039 Hypothyroidism, unspecified: Secondary | ICD-10-CM | POA: Diagnosis not present

## 2017-08-19 DIAGNOSIS — I82411 Acute embolism and thrombosis of right femoral vein: Secondary | ICD-10-CM | POA: Diagnosis not present

## 2017-08-19 DIAGNOSIS — G8929 Other chronic pain: Secondary | ICD-10-CM | POA: Diagnosis not present

## 2017-08-19 DIAGNOSIS — J449 Chronic obstructive pulmonary disease, unspecified: Secondary | ICD-10-CM | POA: Diagnosis not present

## 2017-08-19 DIAGNOSIS — L03115 Cellulitis of right lower limb: Secondary | ICD-10-CM | POA: Diagnosis not present

## 2017-08-19 DIAGNOSIS — I2699 Other pulmonary embolism without acute cor pulmonale: Secondary | ICD-10-CM | POA: Diagnosis not present

## 2017-08-19 DIAGNOSIS — K5790 Diverticulosis of intestine, part unspecified, without perforation or abscess without bleeding: Secondary | ICD-10-CM | POA: Diagnosis not present

## 2017-08-19 DIAGNOSIS — I10 Essential (primary) hypertension: Secondary | ICD-10-CM | POA: Diagnosis not present

## 2017-08-19 DIAGNOSIS — M4712 Other spondylosis with myelopathy, cervical region: Secondary | ICD-10-CM | POA: Diagnosis not present

## 2017-08-19 DIAGNOSIS — I82431 Acute embolism and thrombosis of right popliteal vein: Secondary | ICD-10-CM | POA: Diagnosis not present

## 2017-08-19 DIAGNOSIS — M1990 Unspecified osteoarthritis, unspecified site: Secondary | ICD-10-CM | POA: Diagnosis not present

## 2017-08-21 DIAGNOSIS — J449 Chronic obstructive pulmonary disease, unspecified: Secondary | ICD-10-CM | POA: Diagnosis not present

## 2017-08-21 DIAGNOSIS — I82431 Acute embolism and thrombosis of right popliteal vein: Secondary | ICD-10-CM | POA: Diagnosis not present

## 2017-08-21 DIAGNOSIS — Z87891 Personal history of nicotine dependence: Secondary | ICD-10-CM | POA: Diagnosis not present

## 2017-08-21 DIAGNOSIS — I82411 Acute embolism and thrombosis of right femoral vein: Secondary | ICD-10-CM | POA: Diagnosis not present

## 2017-08-21 DIAGNOSIS — I10 Essential (primary) hypertension: Secondary | ICD-10-CM | POA: Diagnosis not present

## 2017-08-21 DIAGNOSIS — I252 Old myocardial infarction: Secondary | ICD-10-CM | POA: Diagnosis not present

## 2017-08-21 DIAGNOSIS — I2510 Atherosclerotic heart disease of native coronary artery without angina pectoris: Secondary | ICD-10-CM | POA: Diagnosis not present

## 2017-08-21 DIAGNOSIS — L03115 Cellulitis of right lower limb: Secondary | ICD-10-CM | POA: Diagnosis not present

## 2017-08-21 DIAGNOSIS — G8929 Other chronic pain: Secondary | ICD-10-CM | POA: Diagnosis not present

## 2017-08-21 DIAGNOSIS — L03116 Cellulitis of left lower limb: Secondary | ICD-10-CM | POA: Diagnosis not present

## 2017-08-21 DIAGNOSIS — E039 Hypothyroidism, unspecified: Secondary | ICD-10-CM | POA: Diagnosis not present

## 2017-08-21 DIAGNOSIS — Z79891 Long term (current) use of opiate analgesic: Secondary | ICD-10-CM | POA: Diagnosis not present

## 2017-08-21 DIAGNOSIS — E559 Vitamin D deficiency, unspecified: Secondary | ICD-10-CM | POA: Diagnosis not present

## 2017-08-21 DIAGNOSIS — M1990 Unspecified osteoarthritis, unspecified site: Secondary | ICD-10-CM | POA: Diagnosis not present

## 2017-08-21 DIAGNOSIS — I2699 Other pulmonary embolism without acute cor pulmonale: Secondary | ICD-10-CM | POA: Diagnosis not present

## 2017-08-21 DIAGNOSIS — M4712 Other spondylosis with myelopathy, cervical region: Secondary | ICD-10-CM | POA: Diagnosis not present

## 2017-08-21 DIAGNOSIS — M109 Gout, unspecified: Secondary | ICD-10-CM | POA: Diagnosis not present

## 2017-08-21 DIAGNOSIS — Z96651 Presence of right artificial knee joint: Secondary | ICD-10-CM | POA: Diagnosis not present

## 2017-08-21 DIAGNOSIS — I82441 Acute embolism and thrombosis of right tibial vein: Secondary | ICD-10-CM | POA: Diagnosis not present

## 2017-08-21 DIAGNOSIS — Z9181 History of falling: Secondary | ICD-10-CM | POA: Diagnosis not present

## 2017-08-21 DIAGNOSIS — G4700 Insomnia, unspecified: Secondary | ICD-10-CM | POA: Diagnosis not present

## 2017-08-21 DIAGNOSIS — K5790 Diverticulosis of intestine, part unspecified, without perforation or abscess without bleeding: Secondary | ICD-10-CM | POA: Diagnosis not present

## 2017-08-21 DIAGNOSIS — Z7901 Long term (current) use of anticoagulants: Secondary | ICD-10-CM | POA: Diagnosis not present

## 2017-08-21 DIAGNOSIS — Z7982 Long term (current) use of aspirin: Secondary | ICD-10-CM | POA: Diagnosis not present

## 2017-08-21 DIAGNOSIS — E538 Deficiency of other specified B group vitamins: Secondary | ICD-10-CM | POA: Diagnosis not present

## 2017-08-21 DIAGNOSIS — G4733 Obstructive sleep apnea (adult) (pediatric): Secondary | ICD-10-CM | POA: Diagnosis not present

## 2017-08-22 ENCOUNTER — Ambulatory Visit (INDEPENDENT_AMBULATORY_CARE_PROVIDER_SITE_OTHER): Payer: Medicare Other | Admitting: Cardiology

## 2017-08-22 ENCOUNTER — Other Ambulatory Visit: Payer: Self-pay | Admitting: Cardiology

## 2017-08-22 VITALS — BP 130/74 | HR 69 | Ht 70.0 in | Wt 279.0 lb

## 2017-08-22 DIAGNOSIS — I10 Essential (primary) hypertension: Secondary | ICD-10-CM | POA: Diagnosis not present

## 2017-08-22 LAB — BASIC METABOLIC PANEL
BUN/Creatinine Ratio: 14 (ref 10–24)
BUN: 12 mg/dL (ref 8–27)
CO2: 24 mmol/L (ref 20–29)
Calcium: 9 mg/dL (ref 8.6–10.2)
Chloride: 102 mmol/L (ref 96–106)
Creatinine, Ser: 0.88 mg/dL (ref 0.76–1.27)
GFR calc Af Amer: 98 mL/min/{1.73_m2} (ref >59)
GFR calc non Af Amer: 85 mL/min/{1.73_m2} (ref >59)
Glucose: 92 mg/dL (ref 65–99)
Potassium: 4.8 mmol/L (ref 3.5–5.2)
Sodium: 141 mmol/L (ref 134–144)

## 2017-08-22 NOTE — Progress Notes (Signed)
Due to recent medication changes Pt came in for blood pressure, pulse and BMP check. Pt has been tolerationg medication changes well. Per Dr. Geraldo Pitter no changes at this time. F/U as directed previously.

## 2017-08-25 DIAGNOSIS — M109 Gout, unspecified: Secondary | ICD-10-CM | POA: Diagnosis not present

## 2017-08-25 DIAGNOSIS — Z87891 Personal history of nicotine dependence: Secondary | ICD-10-CM | POA: Diagnosis not present

## 2017-08-25 DIAGNOSIS — G4733 Obstructive sleep apnea (adult) (pediatric): Secondary | ICD-10-CM | POA: Diagnosis not present

## 2017-08-25 DIAGNOSIS — L03116 Cellulitis of left lower limb: Secondary | ICD-10-CM | POA: Diagnosis not present

## 2017-08-25 DIAGNOSIS — M4712 Other spondylosis with myelopathy, cervical region: Secondary | ICD-10-CM | POA: Diagnosis not present

## 2017-08-25 DIAGNOSIS — I2699 Other pulmonary embolism without acute cor pulmonale: Secondary | ICD-10-CM | POA: Diagnosis not present

## 2017-08-25 DIAGNOSIS — G4700 Insomnia, unspecified: Secondary | ICD-10-CM | POA: Diagnosis not present

## 2017-08-25 DIAGNOSIS — J449 Chronic obstructive pulmonary disease, unspecified: Secondary | ICD-10-CM | POA: Diagnosis not present

## 2017-08-25 DIAGNOSIS — Z96651 Presence of right artificial knee joint: Secondary | ICD-10-CM | POA: Diagnosis not present

## 2017-08-25 DIAGNOSIS — I82411 Acute embolism and thrombosis of right femoral vein: Secondary | ICD-10-CM | POA: Diagnosis not present

## 2017-08-25 DIAGNOSIS — I82431 Acute embolism and thrombosis of right popliteal vein: Secondary | ICD-10-CM | POA: Diagnosis not present

## 2017-08-25 DIAGNOSIS — M1990 Unspecified osteoarthritis, unspecified site: Secondary | ICD-10-CM | POA: Diagnosis not present

## 2017-08-25 DIAGNOSIS — Z79891 Long term (current) use of opiate analgesic: Secondary | ICD-10-CM | POA: Diagnosis not present

## 2017-08-25 DIAGNOSIS — I2510 Atherosclerotic heart disease of native coronary artery without angina pectoris: Secondary | ICD-10-CM | POA: Diagnosis not present

## 2017-08-25 DIAGNOSIS — Z7901 Long term (current) use of anticoagulants: Secondary | ICD-10-CM | POA: Diagnosis not present

## 2017-08-25 DIAGNOSIS — K5790 Diverticulosis of intestine, part unspecified, without perforation or abscess without bleeding: Secondary | ICD-10-CM | POA: Diagnosis not present

## 2017-08-25 DIAGNOSIS — G8929 Other chronic pain: Secondary | ICD-10-CM | POA: Diagnosis not present

## 2017-08-25 DIAGNOSIS — I82441 Acute embolism and thrombosis of right tibial vein: Secondary | ICD-10-CM | POA: Diagnosis not present

## 2017-08-25 DIAGNOSIS — I252 Old myocardial infarction: Secondary | ICD-10-CM | POA: Diagnosis not present

## 2017-08-25 DIAGNOSIS — Z9181 History of falling: Secondary | ICD-10-CM | POA: Diagnosis not present

## 2017-08-25 DIAGNOSIS — I10 Essential (primary) hypertension: Secondary | ICD-10-CM | POA: Diagnosis not present

## 2017-08-25 DIAGNOSIS — L03115 Cellulitis of right lower limb: Secondary | ICD-10-CM | POA: Diagnosis not present

## 2017-08-25 DIAGNOSIS — Z7982 Long term (current) use of aspirin: Secondary | ICD-10-CM | POA: Diagnosis not present

## 2017-08-25 DIAGNOSIS — E039 Hypothyroidism, unspecified: Secondary | ICD-10-CM | POA: Diagnosis not present

## 2017-08-27 DIAGNOSIS — I2699 Other pulmonary embolism without acute cor pulmonale: Secondary | ICD-10-CM | POA: Diagnosis not present

## 2017-08-27 DIAGNOSIS — I2510 Atherosclerotic heart disease of native coronary artery without angina pectoris: Secondary | ICD-10-CM | POA: Diagnosis not present

## 2017-08-27 DIAGNOSIS — G4733 Obstructive sleep apnea (adult) (pediatric): Secondary | ICD-10-CM | POA: Diagnosis not present

## 2017-08-27 DIAGNOSIS — Z7901 Long term (current) use of anticoagulants: Secondary | ICD-10-CM | POA: Diagnosis not present

## 2017-08-27 DIAGNOSIS — L03116 Cellulitis of left lower limb: Secondary | ICD-10-CM | POA: Diagnosis not present

## 2017-08-27 DIAGNOSIS — M109 Gout, unspecified: Secondary | ICD-10-CM | POA: Diagnosis not present

## 2017-08-27 DIAGNOSIS — Z79891 Long term (current) use of opiate analgesic: Secondary | ICD-10-CM | POA: Diagnosis not present

## 2017-08-27 DIAGNOSIS — Z96651 Presence of right artificial knee joint: Secondary | ICD-10-CM | POA: Diagnosis not present

## 2017-08-27 DIAGNOSIS — I252 Old myocardial infarction: Secondary | ICD-10-CM | POA: Diagnosis not present

## 2017-08-27 DIAGNOSIS — L03115 Cellulitis of right lower limb: Secondary | ICD-10-CM | POA: Diagnosis not present

## 2017-08-27 DIAGNOSIS — M1990 Unspecified osteoarthritis, unspecified site: Secondary | ICD-10-CM | POA: Diagnosis not present

## 2017-08-27 DIAGNOSIS — Z87891 Personal history of nicotine dependence: Secondary | ICD-10-CM | POA: Diagnosis not present

## 2017-08-27 DIAGNOSIS — Z9181 History of falling: Secondary | ICD-10-CM | POA: Diagnosis not present

## 2017-08-27 DIAGNOSIS — M4712 Other spondylosis with myelopathy, cervical region: Secondary | ICD-10-CM | POA: Diagnosis not present

## 2017-08-27 DIAGNOSIS — J449 Chronic obstructive pulmonary disease, unspecified: Secondary | ICD-10-CM | POA: Diagnosis not present

## 2017-08-27 DIAGNOSIS — Z7982 Long term (current) use of aspirin: Secondary | ICD-10-CM | POA: Diagnosis not present

## 2017-08-27 DIAGNOSIS — K5790 Diverticulosis of intestine, part unspecified, without perforation or abscess without bleeding: Secondary | ICD-10-CM | POA: Diagnosis not present

## 2017-08-27 DIAGNOSIS — I82411 Acute embolism and thrombosis of right femoral vein: Secondary | ICD-10-CM | POA: Diagnosis not present

## 2017-08-27 DIAGNOSIS — I82431 Acute embolism and thrombosis of right popliteal vein: Secondary | ICD-10-CM | POA: Diagnosis not present

## 2017-08-27 DIAGNOSIS — E039 Hypothyroidism, unspecified: Secondary | ICD-10-CM | POA: Diagnosis not present

## 2017-08-27 DIAGNOSIS — I10 Essential (primary) hypertension: Secondary | ICD-10-CM | POA: Diagnosis not present

## 2017-08-27 DIAGNOSIS — I82441 Acute embolism and thrombosis of right tibial vein: Secondary | ICD-10-CM | POA: Diagnosis not present

## 2017-08-27 DIAGNOSIS — G4700 Insomnia, unspecified: Secondary | ICD-10-CM | POA: Diagnosis not present

## 2017-08-27 DIAGNOSIS — G8929 Other chronic pain: Secondary | ICD-10-CM | POA: Diagnosis not present

## 2017-09-01 DIAGNOSIS — Z96651 Presence of right artificial knee joint: Secondary | ICD-10-CM | POA: Diagnosis not present

## 2017-09-01 DIAGNOSIS — I252 Old myocardial infarction: Secondary | ICD-10-CM | POA: Diagnosis not present

## 2017-09-01 DIAGNOSIS — K5790 Diverticulosis of intestine, part unspecified, without perforation or abscess without bleeding: Secondary | ICD-10-CM | POA: Diagnosis not present

## 2017-09-01 DIAGNOSIS — Z9181 History of falling: Secondary | ICD-10-CM | POA: Diagnosis not present

## 2017-09-01 DIAGNOSIS — I2699 Other pulmonary embolism without acute cor pulmonale: Secondary | ICD-10-CM | POA: Diagnosis not present

## 2017-09-01 DIAGNOSIS — M109 Gout, unspecified: Secondary | ICD-10-CM | POA: Diagnosis not present

## 2017-09-01 DIAGNOSIS — I82441 Acute embolism and thrombosis of right tibial vein: Secondary | ICD-10-CM | POA: Diagnosis not present

## 2017-09-01 DIAGNOSIS — Z7982 Long term (current) use of aspirin: Secondary | ICD-10-CM | POA: Diagnosis not present

## 2017-09-01 DIAGNOSIS — L03116 Cellulitis of left lower limb: Secondary | ICD-10-CM | POA: Diagnosis not present

## 2017-09-01 DIAGNOSIS — I82431 Acute embolism and thrombosis of right popliteal vein: Secondary | ICD-10-CM | POA: Diagnosis not present

## 2017-09-01 DIAGNOSIS — G4733 Obstructive sleep apnea (adult) (pediatric): Secondary | ICD-10-CM | POA: Diagnosis not present

## 2017-09-01 DIAGNOSIS — Z79891 Long term (current) use of opiate analgesic: Secondary | ICD-10-CM | POA: Diagnosis not present

## 2017-09-01 DIAGNOSIS — I10 Essential (primary) hypertension: Secondary | ICD-10-CM | POA: Diagnosis not present

## 2017-09-01 DIAGNOSIS — G4700 Insomnia, unspecified: Secondary | ICD-10-CM | POA: Diagnosis not present

## 2017-09-01 DIAGNOSIS — I2510 Atherosclerotic heart disease of native coronary artery without angina pectoris: Secondary | ICD-10-CM | POA: Diagnosis not present

## 2017-09-01 DIAGNOSIS — E039 Hypothyroidism, unspecified: Secondary | ICD-10-CM | POA: Diagnosis not present

## 2017-09-01 DIAGNOSIS — L03115 Cellulitis of right lower limb: Secondary | ICD-10-CM | POA: Diagnosis not present

## 2017-09-01 DIAGNOSIS — Z7901 Long term (current) use of anticoagulants: Secondary | ICD-10-CM | POA: Diagnosis not present

## 2017-09-01 DIAGNOSIS — J449 Chronic obstructive pulmonary disease, unspecified: Secondary | ICD-10-CM | POA: Diagnosis not present

## 2017-09-01 DIAGNOSIS — G8929 Other chronic pain: Secondary | ICD-10-CM | POA: Diagnosis not present

## 2017-09-01 DIAGNOSIS — I82411 Acute embolism and thrombosis of right femoral vein: Secondary | ICD-10-CM | POA: Diagnosis not present

## 2017-09-01 DIAGNOSIS — M1990 Unspecified osteoarthritis, unspecified site: Secondary | ICD-10-CM | POA: Diagnosis not present

## 2017-09-01 DIAGNOSIS — Z87891 Personal history of nicotine dependence: Secondary | ICD-10-CM | POA: Diagnosis not present

## 2017-09-01 DIAGNOSIS — M4712 Other spondylosis with myelopathy, cervical region: Secondary | ICD-10-CM | POA: Diagnosis not present

## 2017-09-02 DIAGNOSIS — I2699 Other pulmonary embolism without acute cor pulmonale: Secondary | ICD-10-CM | POA: Diagnosis not present

## 2017-09-02 DIAGNOSIS — E039 Hypothyroidism, unspecified: Secondary | ICD-10-CM | POA: Diagnosis not present

## 2017-09-02 DIAGNOSIS — E538 Deficiency of other specified B group vitamins: Secondary | ICD-10-CM | POA: Diagnosis not present

## 2017-09-02 DIAGNOSIS — I2510 Atherosclerotic heart disease of native coronary artery without angina pectoris: Secondary | ICD-10-CM | POA: Diagnosis not present

## 2017-09-02 DIAGNOSIS — L309 Dermatitis, unspecified: Secondary | ICD-10-CM | POA: Diagnosis not present

## 2017-09-07 DIAGNOSIS — R0902 Hypoxemia: Secondary | ICD-10-CM | POA: Diagnosis not present

## 2017-09-07 DIAGNOSIS — J449 Chronic obstructive pulmonary disease, unspecified: Secondary | ICD-10-CM | POA: Diagnosis not present

## 2017-09-09 DIAGNOSIS — I2699 Other pulmonary embolism without acute cor pulmonale: Secondary | ICD-10-CM | POA: Diagnosis not present

## 2017-09-09 DIAGNOSIS — E559 Vitamin D deficiency, unspecified: Secondary | ICD-10-CM | POA: Diagnosis not present

## 2017-09-09 DIAGNOSIS — E785 Hyperlipidemia, unspecified: Secondary | ICD-10-CM | POA: Diagnosis not present

## 2017-09-09 DIAGNOSIS — I10 Essential (primary) hypertension: Secondary | ICD-10-CM | POA: Diagnosis not present

## 2017-09-09 DIAGNOSIS — E039 Hypothyroidism, unspecified: Secondary | ICD-10-CM | POA: Diagnosis not present

## 2017-09-09 DIAGNOSIS — I2510 Atherosclerotic heart disease of native coronary artery without angina pectoris: Secondary | ICD-10-CM | POA: Diagnosis not present

## 2017-09-10 DIAGNOSIS — H40003 Preglaucoma, unspecified, bilateral: Secondary | ICD-10-CM | POA: Diagnosis not present

## 2017-09-16 ENCOUNTER — Inpatient Hospital Stay: Payer: Medicare Other | Admitting: Pulmonary Disease

## 2017-09-23 DIAGNOSIS — I2510 Atherosclerotic heart disease of native coronary artery without angina pectoris: Secondary | ICD-10-CM | POA: Diagnosis not present

## 2017-09-23 DIAGNOSIS — L03115 Cellulitis of right lower limb: Secondary | ICD-10-CM | POA: Diagnosis not present

## 2017-09-23 DIAGNOSIS — I2699 Other pulmonary embolism without acute cor pulmonale: Secondary | ICD-10-CM | POA: Diagnosis not present

## 2017-09-23 DIAGNOSIS — E559 Vitamin D deficiency, unspecified: Secondary | ICD-10-CM | POA: Diagnosis not present

## 2017-09-23 DIAGNOSIS — E538 Deficiency of other specified B group vitamins: Secondary | ICD-10-CM | POA: Diagnosis not present

## 2017-09-30 DIAGNOSIS — L03115 Cellulitis of right lower limb: Secondary | ICD-10-CM | POA: Diagnosis not present

## 2017-09-30 DIAGNOSIS — E538 Deficiency of other specified B group vitamins: Secondary | ICD-10-CM | POA: Diagnosis not present

## 2017-09-30 DIAGNOSIS — I2699 Other pulmonary embolism without acute cor pulmonale: Secondary | ICD-10-CM | POA: Diagnosis not present

## 2017-09-30 DIAGNOSIS — I2510 Atherosclerotic heart disease of native coronary artery without angina pectoris: Secondary | ICD-10-CM | POA: Diagnosis not present

## 2017-09-30 DIAGNOSIS — E559 Vitamin D deficiency, unspecified: Secondary | ICD-10-CM | POA: Diagnosis not present

## 2017-10-08 DIAGNOSIS — R0902 Hypoxemia: Secondary | ICD-10-CM | POA: Diagnosis not present

## 2017-10-08 DIAGNOSIS — J449 Chronic obstructive pulmonary disease, unspecified: Secondary | ICD-10-CM | POA: Diagnosis not present

## 2017-10-10 DIAGNOSIS — E559 Vitamin D deficiency, unspecified: Secondary | ICD-10-CM | POA: Diagnosis not present

## 2017-10-10 DIAGNOSIS — I2699 Other pulmonary embolism without acute cor pulmonale: Secondary | ICD-10-CM | POA: Diagnosis not present

## 2017-10-10 DIAGNOSIS — J208 Acute bronchitis due to other specified organisms: Secondary | ICD-10-CM | POA: Diagnosis not present

## 2017-10-10 DIAGNOSIS — I2510 Atherosclerotic heart disease of native coronary artery without angina pectoris: Secondary | ICD-10-CM | POA: Diagnosis not present

## 2017-10-10 DIAGNOSIS — E538 Deficiency of other specified B group vitamins: Secondary | ICD-10-CM | POA: Diagnosis not present

## 2017-10-16 DIAGNOSIS — I2510 Atherosclerotic heart disease of native coronary artery without angina pectoris: Secondary | ICD-10-CM | POA: Diagnosis not present

## 2017-10-16 DIAGNOSIS — I2699 Other pulmonary embolism without acute cor pulmonale: Secondary | ICD-10-CM | POA: Diagnosis not present

## 2017-10-16 DIAGNOSIS — E559 Vitamin D deficiency, unspecified: Secondary | ICD-10-CM | POA: Diagnosis not present

## 2017-10-16 DIAGNOSIS — E538 Deficiency of other specified B group vitamins: Secondary | ICD-10-CM | POA: Diagnosis not present

## 2017-10-16 DIAGNOSIS — I10 Essential (primary) hypertension: Secondary | ICD-10-CM | POA: Diagnosis not present

## 2017-10-16 DIAGNOSIS — J208 Acute bronchitis due to other specified organisms: Secondary | ICD-10-CM | POA: Diagnosis not present

## 2017-10-23 DIAGNOSIS — I2699 Other pulmonary embolism without acute cor pulmonale: Secondary | ICD-10-CM | POA: Diagnosis not present

## 2017-10-23 DIAGNOSIS — E538 Deficiency of other specified B group vitamins: Secondary | ICD-10-CM | POA: Diagnosis not present

## 2017-10-23 DIAGNOSIS — E559 Vitamin D deficiency, unspecified: Secondary | ICD-10-CM | POA: Diagnosis not present

## 2017-10-23 DIAGNOSIS — I2510 Atherosclerotic heart disease of native coronary artery without angina pectoris: Secondary | ICD-10-CM | POA: Diagnosis not present

## 2017-10-23 DIAGNOSIS — J208 Acute bronchitis due to other specified organisms: Secondary | ICD-10-CM | POA: Diagnosis not present

## 2017-11-06 DIAGNOSIS — I2510 Atherosclerotic heart disease of native coronary artery without angina pectoris: Secondary | ICD-10-CM | POA: Diagnosis not present

## 2017-11-06 DIAGNOSIS — I2699 Other pulmonary embolism without acute cor pulmonale: Secondary | ICD-10-CM | POA: Diagnosis not present

## 2017-11-06 DIAGNOSIS — E559 Vitamin D deficiency, unspecified: Secondary | ICD-10-CM | POA: Diagnosis not present

## 2017-11-06 DIAGNOSIS — E538 Deficiency of other specified B group vitamins: Secondary | ICD-10-CM | POA: Diagnosis not present

## 2017-11-06 DIAGNOSIS — R0600 Dyspnea, unspecified: Secondary | ICD-10-CM | POA: Diagnosis not present

## 2017-11-07 DIAGNOSIS — R0902 Hypoxemia: Secondary | ICD-10-CM | POA: Diagnosis not present

## 2017-11-07 DIAGNOSIS — J449 Chronic obstructive pulmonary disease, unspecified: Secondary | ICD-10-CM | POA: Diagnosis not present

## 2017-11-11 DIAGNOSIS — R0600 Dyspnea, unspecified: Secondary | ICD-10-CM | POA: Diagnosis not present

## 2017-11-11 DIAGNOSIS — J439 Emphysema, unspecified: Secondary | ICD-10-CM | POA: Diagnosis not present

## 2017-11-13 DIAGNOSIS — E559 Vitamin D deficiency, unspecified: Secondary | ICD-10-CM | POA: Diagnosis not present

## 2017-11-13 DIAGNOSIS — I2699 Other pulmonary embolism without acute cor pulmonale: Secondary | ICD-10-CM | POA: Diagnosis not present

## 2017-11-13 DIAGNOSIS — E538 Deficiency of other specified B group vitamins: Secondary | ICD-10-CM | POA: Diagnosis not present

## 2017-11-13 DIAGNOSIS — I2510 Atherosclerotic heart disease of native coronary artery without angina pectoris: Secondary | ICD-10-CM | POA: Diagnosis not present

## 2017-11-22 DIAGNOSIS — I2699 Other pulmonary embolism without acute cor pulmonale: Secondary | ICD-10-CM | POA: Diagnosis not present

## 2017-11-22 DIAGNOSIS — I2510 Atherosclerotic heart disease of native coronary artery without angina pectoris: Secondary | ICD-10-CM | POA: Diagnosis not present

## 2017-11-22 DIAGNOSIS — N390 Urinary tract infection, site not specified: Secondary | ICD-10-CM | POA: Diagnosis not present

## 2017-11-22 DIAGNOSIS — E559 Vitamin D deficiency, unspecified: Secondary | ICD-10-CM | POA: Diagnosis not present

## 2017-12-01 DIAGNOSIS — I2699 Other pulmonary embolism without acute cor pulmonale: Secondary | ICD-10-CM | POA: Diagnosis not present

## 2017-12-01 DIAGNOSIS — R0600 Dyspnea, unspecified: Secondary | ICD-10-CM | POA: Diagnosis not present

## 2017-12-01 DIAGNOSIS — E559 Vitamin D deficiency, unspecified: Secondary | ICD-10-CM | POA: Diagnosis not present

## 2017-12-01 DIAGNOSIS — I2510 Atherosclerotic heart disease of native coronary artery without angina pectoris: Secondary | ICD-10-CM | POA: Diagnosis not present

## 2017-12-01 DIAGNOSIS — J208 Acute bronchitis due to other specified organisms: Secondary | ICD-10-CM | POA: Diagnosis not present

## 2017-12-03 DIAGNOSIS — I2699 Other pulmonary embolism without acute cor pulmonale: Secondary | ICD-10-CM | POA: Diagnosis not present

## 2017-12-03 DIAGNOSIS — I2510 Atherosclerotic heart disease of native coronary artery without angina pectoris: Secondary | ICD-10-CM | POA: Diagnosis not present

## 2017-12-03 DIAGNOSIS — E559 Vitamin D deficiency, unspecified: Secondary | ICD-10-CM | POA: Diagnosis not present

## 2017-12-03 DIAGNOSIS — E538 Deficiency of other specified B group vitamins: Secondary | ICD-10-CM | POA: Diagnosis not present

## 2017-12-08 DIAGNOSIS — R0902 Hypoxemia: Secondary | ICD-10-CM | POA: Diagnosis not present

## 2017-12-08 DIAGNOSIS — J449 Chronic obstructive pulmonary disease, unspecified: Secondary | ICD-10-CM | POA: Diagnosis not present

## 2017-12-17 ENCOUNTER — Other Ambulatory Visit: Payer: Self-pay

## 2017-12-17 MED ORDER — FUROSEMIDE 20 MG PO TABS: 20.0000 mg | ORAL_TABLET | Freq: Every day | ORAL | 0 refills | Status: DC

## 2018-01-07 DIAGNOSIS — R0902 Hypoxemia: Secondary | ICD-10-CM | POA: Diagnosis not present

## 2018-01-07 DIAGNOSIS — J449 Chronic obstructive pulmonary disease, unspecified: Secondary | ICD-10-CM | POA: Diagnosis not present

## 2018-01-09 DIAGNOSIS — E538 Deficiency of other specified B group vitamins: Secondary | ICD-10-CM | POA: Diagnosis not present

## 2018-01-09 DIAGNOSIS — J208 Acute bronchitis due to other specified organisms: Secondary | ICD-10-CM | POA: Diagnosis not present

## 2018-01-09 DIAGNOSIS — I2510 Atherosclerotic heart disease of native coronary artery without angina pectoris: Secondary | ICD-10-CM | POA: Diagnosis not present

## 2018-01-09 DIAGNOSIS — I2699 Other pulmonary embolism without acute cor pulmonale: Secondary | ICD-10-CM | POA: Diagnosis not present

## 2018-01-09 DIAGNOSIS — E559 Vitamin D deficiency, unspecified: Secondary | ICD-10-CM | POA: Diagnosis not present

## 2018-01-13 DIAGNOSIS — I2510 Atherosclerotic heart disease of native coronary artery without angina pectoris: Secondary | ICD-10-CM | POA: Diagnosis not present

## 2018-01-13 DIAGNOSIS — Z9181 History of falling: Secondary | ICD-10-CM | POA: Diagnosis not present

## 2018-01-13 DIAGNOSIS — Z139 Encounter for screening, unspecified: Secondary | ICD-10-CM | POA: Diagnosis not present

## 2018-01-13 DIAGNOSIS — E559 Vitamin D deficiency, unspecified: Secondary | ICD-10-CM | POA: Diagnosis not present

## 2018-01-13 DIAGNOSIS — E538 Deficiency of other specified B group vitamins: Secondary | ICD-10-CM | POA: Diagnosis not present

## 2018-01-13 DIAGNOSIS — I2699 Other pulmonary embolism without acute cor pulmonale: Secondary | ICD-10-CM | POA: Diagnosis not present

## 2018-01-27 DIAGNOSIS — E559 Vitamin D deficiency, unspecified: Secondary | ICD-10-CM | POA: Diagnosis not present

## 2018-01-27 DIAGNOSIS — I2510 Atherosclerotic heart disease of native coronary artery without angina pectoris: Secondary | ICD-10-CM | POA: Diagnosis not present

## 2018-01-27 DIAGNOSIS — I10 Essential (primary) hypertension: Secondary | ICD-10-CM | POA: Diagnosis not present

## 2018-01-27 DIAGNOSIS — R609 Edema, unspecified: Secondary | ICD-10-CM | POA: Diagnosis not present

## 2018-01-27 DIAGNOSIS — E785 Hyperlipidemia, unspecified: Secondary | ICD-10-CM | POA: Diagnosis not present

## 2018-01-27 DIAGNOSIS — E538 Deficiency of other specified B group vitamins: Secondary | ICD-10-CM | POA: Diagnosis not present

## 2018-01-27 DIAGNOSIS — I2699 Other pulmonary embolism without acute cor pulmonale: Secondary | ICD-10-CM | POA: Diagnosis not present

## 2018-02-07 DIAGNOSIS — R0902 Hypoxemia: Secondary | ICD-10-CM | POA: Diagnosis not present

## 2018-02-07 DIAGNOSIS — J449 Chronic obstructive pulmonary disease, unspecified: Secondary | ICD-10-CM | POA: Diagnosis not present

## 2018-02-11 DIAGNOSIS — E559 Vitamin D deficiency, unspecified: Secondary | ICD-10-CM | POA: Diagnosis not present

## 2018-02-11 DIAGNOSIS — E538 Deficiency of other specified B group vitamins: Secondary | ICD-10-CM | POA: Diagnosis not present

## 2018-02-11 DIAGNOSIS — I2699 Other pulmonary embolism without acute cor pulmonale: Secondary | ICD-10-CM | POA: Diagnosis not present

## 2018-02-11 DIAGNOSIS — I2510 Atherosclerotic heart disease of native coronary artery without angina pectoris: Secondary | ICD-10-CM | POA: Diagnosis not present

## 2018-02-17 DIAGNOSIS — J028 Acute pharyngitis due to other specified organisms: Secondary | ICD-10-CM | POA: Diagnosis not present

## 2018-02-17 DIAGNOSIS — E538 Deficiency of other specified B group vitamins: Secondary | ICD-10-CM | POA: Diagnosis not present

## 2018-02-17 DIAGNOSIS — I2510 Atherosclerotic heart disease of native coronary artery without angina pectoris: Secondary | ICD-10-CM | POA: Diagnosis not present

## 2018-02-17 DIAGNOSIS — E559 Vitamin D deficiency, unspecified: Secondary | ICD-10-CM | POA: Diagnosis not present

## 2018-02-17 DIAGNOSIS — J208 Acute bronchitis due to other specified organisms: Secondary | ICD-10-CM | POA: Diagnosis not present

## 2018-02-18 ENCOUNTER — Telehealth: Payer: Self-pay | Admitting: Cardiology

## 2018-02-18 NOTE — Telephone Encounter (Signed)
error 

## 2018-02-26 ENCOUNTER — Encounter: Payer: Self-pay | Admitting: Cardiology

## 2018-02-26 ENCOUNTER — Ambulatory Visit: Payer: Medicare Other | Admitting: Cardiology

## 2018-02-26 VITALS — BP 116/60 | HR 73 | Ht 70.0 in | Wt 278.4 lb

## 2018-02-26 DIAGNOSIS — R0609 Other forms of dyspnea: Secondary | ICD-10-CM | POA: Diagnosis not present

## 2018-02-26 DIAGNOSIS — I10 Essential (primary) hypertension: Secondary | ICD-10-CM | POA: Diagnosis not present

## 2018-02-26 DIAGNOSIS — R0600 Dyspnea, unspecified: Secondary | ICD-10-CM

## 2018-02-26 HISTORY — DX: Morbid (severe) obesity due to excess calories: E66.01

## 2018-02-26 LAB — PRO B NATRIURETIC PEPTIDE: NT-Pro BNP: 73 pg/mL (ref 0–376)

## 2018-02-26 NOTE — Progress Notes (Signed)
Cardiology Office Note:    Date:  02/26/2018   ID:  [name], DOB 07-24-1945, MRN [id number]  PCP:  Cher Nakai, MD  Cardiologist:  Jenean Lindau, MD   Referring MD: Cher Nakai, MD    ASSESSMENT:    1. Essential hypertension   2. Morbid obesity ([place])    PLAN:    In order of problems listed above:  1. I agree with his primary care doctor's evaluation.  I have been asked to evaluate for cardiovascular etiology for the shortness of breath.  I will obtain a BNP today.  His doctor has him on diuretic therapy.  Patient will have also echocardiogram to assess murmur heard on auscultation.  Will undergo Lexiscan sestamibi to see if he has any issues of obstructive coronary artery disease that may be causing his symptoms. 2. Primary prevention stressed.  Importance of compliance with diet stressed he vocalized understanding.  He knows to go to the nearest emergency room for any significant concerns. 3. Patient will be seen in follow-up appointment in 6 months or earlier if the patient has any concerns    Medication Adjustments/Labs and Tests Ordered: Current medicines are reviewed at length with the patient today.  Concerns regarding medicines are outlined above.  No orders of the defined types were placed in this encounter.  No orders of the defined types were placed in this encounter.    Chief Complaint  Patient presents with  . Follow-up  . Edema    in both legs for about 2 months      History of Present Illness:    Glenn Beck is a 73 y.o. male.  Has been evaluated by me in the past.  He has morbid obesity, essential hypertension and history of pulmonary embolism.  He takes his Eliquis meticulously.  He mentions to me that he is referred here for shortness of breath on exertion and this is going on for quite some time.  No chest pain orthopnea or PND.  At the time of my evaluation, the patient is alert awake oriented and in no distress.  Past Medical History:   Diagnosis Date  . Anxiety    takes Xanax daily as needed  . Arthritis    "all over my body"  . Asthma    bronchial  . Chronic back pain   . Complication of anesthesia    pt. reports swallowing has been difficult since having ACDF- 2016, has been eval. by ENT & testing done at [facility].   . Diverticulosis    takes Bentyl daily as needed  . GERD (gastroesophageal reflux disease)    takes Nexium daily as needed  . Glaucoma    uses eye drops  . Gout    on study medications for this  . Headache    r/t neck issues  . History of colon polyps    benign  . History of shingles   . Hypertension    takes Lisinopril and Metoprolol daily  . Hypothyroidism    takes Synthroid daily  . Insomnia    takes Triazolam nightly as needed  . Joint pain   . Kidney infection    beint treated at present with Amoxicillin   . Myocardial infarction ([place]) >41yrs ago  . Pneumonia 78yrs ago  . Shortness of breath dyspnea    with exertion  . Swallowing difficulty    has been evaluated at [facility]. for this problem since Cervical Fusion    Past Surgical  History:  Procedure Laterality Date  . ANTERIOR CERVICAL DECOMP/DISCECTOMY FUSION N/A 04/19/2014   Procedure: ANTERIOR CERVICAL DECOMPRESSION/DISCECTOMY FUSION CERVICAL THREE-FOUR CERVICAL FOUR-FIVE ,CERVICAL FIVE-SIX WITH INTERBODY PROTHESIS PLATING BONEGRAFT;  Surgeon: Consuella Lose, MD;  Location: [place] NEURO ORS;  Service: Neurosurgery;  Laterality: N/A;  . BACK SURGERY     x 2;fusion  . CARDIAC CATHETERIZATION  >61yrs ago   /w angioplasty  . COLONOSCOPY    . ESOPHAGOGASTRODUODENOSCOPY    . EYE SURGERY Bilateral   . HERNIA REPAIR Right    many yrs. ago   . KNEE ARTHROSCOPY Right   . TONSILLECTOMY    . TOTAL KNEE ARTHROPLASTY Right 12/26/2014   Procedure: TOTAL KNEE ARTHROPLASTY;  Surgeon: Vickey Huger, MD;  Location: [place];  Service: Orthopedics;  Laterality: Right;    Current Medications: Current Meds  Medication Sig  .  albuterol (PROVENTIL HFA;VENTOLIN HFA) 108 (90 BASE) MCG/ACT inhaler Inhale 2 puffs into the lungs every 6 (six) hours as needed for wheezing or shortness of breath.  Marland Kitchen apixaban (ELIQUIS) 5 MG TABS tablet Take 1 tablet (5 mg total) by mouth 2 (two) times daily.  Marland Kitchen esomeprazole (NEXIUM) 20 MG capsule Take 20 mg by mouth daily as needed (acid reflux).   . feeding supplement, ENSURE ENLIVE, (ENSURE ENLIVE) LIQD Take 237 mLs by mouth 2 (two) times daily between meals.  . furosemide (LASIX) 20 MG tablet Take 1 tablet (20 mg total) by mouth daily. (Patient taking differently: Take 40 mg by mouth 2 (two) times daily. )  . HYDROcodone-acetaminophen (NORCO) 10-325 MG tablet Take 1 tablet by mouth 3 (three) times daily as needed (pain). for pain  . ipratropium-albuterol (DUONEB) 0.5-2.5 (3) MG/3ML SOLN Take 3 mLs by nebulization 4 (four) times daily as needed (shortness of breath/wheezing).   Marland Kitchen latanoprost (XALATAN) 0.005 % ophthalmic solution Place 1 drop into both eyes at bedtime.  Marland Kitchen levothyroxine (SYNTHROID, LEVOTHROID) 200 MCG tablet Take 200 mcg by mouth daily before breakfast.  . loratadine (CLARITIN) 10 MG tablet Take 10 mg by mouth daily as needed (seasonal allergies).   . metoprolol succinate (TOPROL-XL) 50 MG 24 hr tablet Take 1 tablet (50 mg total) by mouth daily.  . tamsulosin (FLOMAX) 0.4 MG CAPS capsule Take 0.4 mg by mouth daily as needed (infrequent urination).   . triazolam (HALCION) 0.25 MG tablet Take 0.125 mg by mouth at bedtime.      Allergies:   Ciprofloxacin; Nasacort [triamcinolone]; Novocain [procaine]; and Other   Social History   Socioeconomic History  . Marital status: Widowed    Spouse name: Not on file  . Number of children: Not on file  . Years of education: Not on file  . Highest education level: Not on file  Occupational History  . Occupation: Regulatory affairs officer  Social Needs  . Financial resource strain: Patient refused  . Food insecurity:    Worry: Patient refused     Inability: Patient refused  . Transportation needs:    Medical: Patient refused    Non-medical: Patient refused  Tobacco Use  . Smoking status: Former Smoker    Packs/day: 1.50    Years: 40.00    Pack years: 60.00    Types: Cigarettes    Start date: 1961    Last attempt to quit: 2001    Years since quitting: 19.1  . Smokeless tobacco: Former Systems developer  . Tobacco comment: quit smoking in 2001  Substance and Sexual Activity  . Alcohol use: No  . Drug use: No  .  Sexual activity: Not Currently  Lifestyle  . Physical activity:    Days per week: Patient refused    Minutes per session: Patient refused  . Stress: Patient refused  Relationships  . Social connections:    Talks on phone: Patient refused    Gets together: Patient refused    Attends religious service: Patient refused    Active member of club or organization: Patient refused    Attends meetings of clubs or organizations: Patient refused    Relationship status: Patient refused  Other Topics Concern  . Not on file  Social History Narrative   No social documentation.     Family History: The patient's family history is not on file.  ROS:   Please see the history of present illness.    All other systems reviewed and are negative.  EKGs/Labs/Other Studies Reviewed:    The following studies were reviewed today: I discussed my findings with the patient at extensive length.   Recent Labs: 08/07/2017: Hemoglobin 15.0; Platelets 228 08/22/2017: BUN 12; Creatinine, Ser 0.88; Potassium 4.8; Sodium 141  Recent Lipid Panel No results found for: CHOL, TRIG, HDL, CHOLHDL, VLDL, LDLCALC, LDLDIRECT  Physical Exam:    VS:  BP 116/60   Pulse 73   Ht 5\' 10"  (1.778 m)   Wt 278 lb 6.4 oz (126.3 kg)   SpO2 95%   BMI 39.95 kg/m     Wt Readings from Last 3 Encounters:  02/26/18 278 lb 6.4 oz (126.3 kg)  08/22/17 279 lb (126.6 kg)  08/15/17 280 lb (127 kg)     GEN: Patient is in no acute distress HEENT: Normal NECK: No  JVD; No carotid bruits LYMPHATICS: No lymphadenopathy CARDIAC: Hear sounds regular, 2/6 systolic murmur at the apex. RESPIRATORY:  Clear to auscultation without rales, wheezing or rhonchi  ABDOMEN: Soft, non-tender, non-distended MUSCULOSKELETAL:  No edema; No deformity  SKIN: Warm and dry NEUROLOGIC:  Alert and oriented x 3 PSYCHIATRIC:  Normal affect   Signed, Jenean Lindau, MD  02/26/2018 9:45 AM    [place]

## 2018-02-26 NOTE — Patient Instructions (Signed)
Medication Instructions:  Your physician recommends that you continue on your current medications as directed. Please refer to the Current Medication list given to you today.  If you need a refill on your cardiac medications before your next appointment, please call your pharmacy.   Lab work: Your physician recommends that you return for lab work today: BNP   If you have labs (blood work) drawn today and your tests are completely normal, you will receive your results only by: Marland Kitchen MyChart Message (if you have MyChart) OR . A paper copy in the mail If you have any lab test that is abnormal or we need to change your treatment, we will call you to review the results.  Testing/Procedures: Your physician has requested that you have a lexiscan myoview. For further information please visit [email or web address]. Please follow instruction sheet, as given.  Your physician has requested that you have an echocardiogram. Echocardiography is a painless test that uses sound waves to create images of your heart. It provides your doctor with information about the size and shape of your heart and how well your heart's chambers and valves are working. This procedure takes approximately one hour. There are no restrictions for this procedure.      Follow-Up: At [facility], you and your health needs are our priority.  As part of our continuing mission to provide you with exceptional heart care, we have created designated Provider Care Teams.  These Care Teams include your primary Cardiologist (physician) and Advanced Practice Providers (APPs -  Physician Assistants and Nurse Practitioners) who all work together to provide you with the care you need, when you need it. You will need a follow up appointment in 6 months.  Please call our office 2 months in advance to schedule this appointment.  You may see No primary care provider on file. or another member of our [employer] Provider Team in [place]: Jenne Campus, MD . Shirlee More, MD  Any Other Special Instructions Will Be Listed Below (If Applicable).   Echocardiogram An echocardiogram is a procedure that uses painless sound waves (ultrasound) to produce an image of the heart. Images from an echocardiogram can provide important information about:  Signs of coronary artery disease (CAD).  Aneurysm detection. An aneurysm is a weak or damaged part of an artery wall that bulges out from the normal force of blood pumping through the body.  Heart size and shape. Changes in the size or shape of the heart can be associated with certain conditions, including heart failure, aneurysm, and CAD.  Heart muscle function.  Heart valve function.  Signs of a past heart attack.  Fluid buildup around the heart.  Thickening of the heart muscle.  A tumor or infectious growth around the heart valves. Tell a health care provider about:  Any allergies you have.  All medicines you are taking, including vitamins, herbs, eye drops, creams, and over-the-counter medicines.  Any blood disorders you have.  Any surgeries you have had.  Any medical conditions you have.  Whether you are pregnant or may be pregnant. What are the risks? Generally, this is a safe procedure. However, problems may occur, including:  Allergic reaction to dye (contrast) that may be used during the procedure. What happens before the procedure? No specific preparation is needed. You may eat and drink normally. What happens during the procedure?   An IV tube may be inserted into one of your veins.  You may receive contrast through this tube. A contrast is an  injection that improves the quality of the pictures from your heart.  A gel will be applied to your chest.  A wand-like tool (transducer) will be moved over your chest. The gel will help to transmit the sound waves from the transducer.  The sound waves will harmlessly bounce off of your heart to allow the heart  images to be captured in real-time motion. The images will be recorded on a computer. The procedure may vary among health care providers and hospitals. What happens after the procedure?  You may return to your normal, everyday life, including diet, activities, and medicines, unless your health care provider tells you not to do that. Summary  An echocardiogram is a procedure that uses painless sound waves (ultrasound) to produce an image of the heart.  Images from an echocardiogram can provide important information about the size and shape of your heart, heart muscle function, heart valve function, and fluid buildup around your heart.  You do not need to do anything to prepare before this procedure. You may eat and drink normally.  After the echocardiogram is completed, you may return to your normal, everyday life, unless your health care provider tells you not to do that. This information is not intended to replace advice given to you by your health care provider. Make sure you discuss any questions you have with your health care provider. Document Released: 12/29/1999 Document Revised: 02/03/2016 Document Reviewed: 02/03/2016 Elsevier Interactive Patient Education  2019 [employer].    Cardiac Nuclear Scan A cardiac nuclear scan is a test that measures blood flow to the heart when a person is resting and when he or she is exercising. The test looks for problems such as:  Not enough blood reaching a portion of the heart.  The heart muscle not working normally. You may need this test if:  You have heart disease.  You have had abnormal lab results.  You have had heart surgery or a balloon procedure to open up blocked arteries (angioplasty).  You have chest pain.  You have shortness of breath. In this test, a radioactive dye (tracer) is injected into your bloodstream. After the tracer has traveled to your heart, an imaging device is used to measure how much of the tracer is  absorbed by or distributed to various areas of your heart. This procedure is usually done at a hospital and takes 2-4 hours. Tell a health care provider about:  Any allergies you have.  All medicines you are taking, including vitamins, herbs, eye drops, creams, and over-the-counter medicines.  Any problems you or family members have had with anesthetic medicines.  Any blood disorders you have.  Any surgeries you have had.  Any medical conditions you have.  Whether you are pregnant or may be pregnant. What are the risks? Generally, this is a safe procedure. However, problems may occur, including:  Serious chest pain and heart attack. This is only a risk if the stress portion of the test is done.  Rapid heartbeat.  Sensation of warmth in your chest. This usually passes quickly.  Allergic reaction to the tracer. What happens before the procedure?  Ask your health care provider about changing or stopping your regular medicines. This is especially important if you are taking diabetes medicines or blood thinners.  Follow instructions from your health care provider about eating or drinking restrictions.  Remove your jewelry on the day of the procedure. What happens during the procedure?  An IV will be inserted into one of your  veins.  Your health care provider will inject a small amount of radioactive tracer through the IV.  You will wait for 20-40 minutes while the tracer travels through your bloodstream.  Your heart activity will be monitored with an electrocardiogram (ECG).  You will lie down on an exam table.  Images of your heart will be taken for about 15-20 minutes.  You may also have a stress test. For this test, one of the following may be done: ? You will exercise on a treadmill or stationary bike. While you exercise, your heart's activity will be monitored with an ECG, and your blood pressure will be checked. ? You will be given medicines that will increase blood  flow to parts of your heart. This is done if you are unable to exercise.  When blood flow to your heart has peaked, a tracer will again be injected through the IV.  After 20-40 minutes, you will get back on the exam table and have more images taken of your heart.  Depending on the type of tracer used, scans may need to be repeated 3-4 hours later.  Your IV line will be removed when the procedure is over. The procedure may vary among health care providers and hospitals. What happens after the procedure?  Unless your health care provider tells you otherwise, you may return to your normal schedule, including diet, activities, and medicines.  Unless your health care provider tells you otherwise, you may increase your fluid intake. This will help to flush the contrast dye from your body. Drink enough fluid to keep your urine pale yellow.  Ask your health care provider, or the department that is doing the test: ? When will my results be ready? ? How will I get my results? Summary  A cardiac nuclear scan measures the blood flow to the heart when a person is resting and when he or she is exercising.  Tell your health care provider if you are pregnant.  Before the procedure, ask your health care provider about changing or stopping your regular medicines. This is especially important if you are taking diabetes medicines or blood thinners.  After the procedure, unless your health care provider tells you otherwise, increase your fluid intake. This will help flush the contrast dye from your body.  After the procedure, unless your health care provider tells you otherwise, you may return to your normal schedule, including diet, activities, and medicines. This information is not intended to replace advice given to you by your health care provider. Make sure you discuss any questions you have with your health care provider. Document Released: 01/26/2004 Document Revised: 06/16/2017 Document Reviewed:  06/16/2017 Elsevier Interactive Patient Education  2019 [employer].

## 2018-02-27 ENCOUNTER — Ambulatory Visit (INDEPENDENT_AMBULATORY_CARE_PROVIDER_SITE_OTHER): Payer: Medicare Other

## 2018-02-27 DIAGNOSIS — R0609 Other forms of dyspnea: Secondary | ICD-10-CM | POA: Diagnosis not present

## 2018-02-27 NOTE — Progress Notes (Signed)
Complete echocardiogram has been performed.  Jimmy Naya Ilagan RDCS, RVT 

## 2018-03-02 NOTE — Addendum Note (Signed)
Addended by: Orland Penman on: 03/02/2018 03:02 PM   Modules accepted: Orders

## 2018-03-06 DIAGNOSIS — E538 Deficiency of other specified B group vitamins: Secondary | ICD-10-CM | POA: Diagnosis not present

## 2018-03-06 DIAGNOSIS — I2510 Atherosclerotic heart disease of native coronary artery without angina pectoris: Secondary | ICD-10-CM | POA: Diagnosis not present

## 2018-03-06 DIAGNOSIS — E559 Vitamin D deficiency, unspecified: Secondary | ICD-10-CM | POA: Diagnosis not present

## 2018-03-06 DIAGNOSIS — L03119 Cellulitis of unspecified part of limb: Secondary | ICD-10-CM | POA: Diagnosis not present

## 2018-03-06 DIAGNOSIS — I2699 Other pulmonary embolism without acute cor pulmonale: Secondary | ICD-10-CM | POA: Diagnosis not present

## 2018-03-10 DIAGNOSIS — R0902 Hypoxemia: Secondary | ICD-10-CM | POA: Diagnosis not present

## 2018-03-10 DIAGNOSIS — J449 Chronic obstructive pulmonary disease, unspecified: Secondary | ICD-10-CM | POA: Diagnosis not present

## 2018-03-10 DIAGNOSIS — R0609 Other forms of dyspnea: Secondary | ICD-10-CM | POA: Diagnosis not present

## 2018-03-13 DIAGNOSIS — L03119 Cellulitis of unspecified part of limb: Secondary | ICD-10-CM | POA: Diagnosis not present

## 2018-03-13 DIAGNOSIS — E559 Vitamin D deficiency, unspecified: Secondary | ICD-10-CM | POA: Diagnosis not present

## 2018-03-13 DIAGNOSIS — I2510 Atherosclerotic heart disease of native coronary artery without angina pectoris: Secondary | ICD-10-CM | POA: Diagnosis not present

## 2018-03-13 DIAGNOSIS — E538 Deficiency of other specified B group vitamins: Secondary | ICD-10-CM | POA: Diagnosis not present

## 2018-03-13 DIAGNOSIS — I2699 Other pulmonary embolism without acute cor pulmonale: Secondary | ICD-10-CM | POA: Diagnosis not present

## 2018-03-18 DIAGNOSIS — H40003 Preglaucoma, unspecified, bilateral: Secondary | ICD-10-CM | POA: Diagnosis not present

## 2018-03-19 DIAGNOSIS — I2510 Atherosclerotic heart disease of native coronary artery without angina pectoris: Secondary | ICD-10-CM | POA: Diagnosis not present

## 2018-03-19 DIAGNOSIS — E559 Vitamin D deficiency, unspecified: Secondary | ICD-10-CM | POA: Diagnosis not present

## 2018-03-19 DIAGNOSIS — L03119 Cellulitis of unspecified part of limb: Secondary | ICD-10-CM | POA: Diagnosis not present

## 2018-03-19 DIAGNOSIS — M159 Polyosteoarthritis, unspecified: Secondary | ICD-10-CM | POA: Diagnosis not present

## 2018-04-03 DIAGNOSIS — L03119 Cellulitis of unspecified part of limb: Secondary | ICD-10-CM | POA: Diagnosis not present

## 2018-04-03 DIAGNOSIS — R609 Edema, unspecified: Secondary | ICD-10-CM | POA: Diagnosis not present

## 2018-04-03 DIAGNOSIS — E559 Vitamin D deficiency, unspecified: Secondary | ICD-10-CM | POA: Diagnosis not present

## 2018-04-03 DIAGNOSIS — E538 Deficiency of other specified B group vitamins: Secondary | ICD-10-CM | POA: Diagnosis not present

## 2018-04-03 DIAGNOSIS — I2510 Atherosclerotic heart disease of native coronary artery without angina pectoris: Secondary | ICD-10-CM | POA: Diagnosis not present

## 2018-04-08 DIAGNOSIS — J449 Chronic obstructive pulmonary disease, unspecified: Secondary | ICD-10-CM | POA: Diagnosis not present

## 2018-04-08 DIAGNOSIS — R0902 Hypoxemia: Secondary | ICD-10-CM | POA: Diagnosis not present

## 2018-04-13 DIAGNOSIS — J069 Acute upper respiratory infection, unspecified: Secondary | ICD-10-CM | POA: Diagnosis not present

## 2018-04-13 DIAGNOSIS — I2699 Other pulmonary embolism without acute cor pulmonale: Secondary | ICD-10-CM | POA: Diagnosis not present

## 2018-04-13 DIAGNOSIS — I2510 Atherosclerotic heart disease of native coronary artery without angina pectoris: Secondary | ICD-10-CM | POA: Diagnosis not present

## 2018-04-13 DIAGNOSIS — E538 Deficiency of other specified B group vitamins: Secondary | ICD-10-CM | POA: Diagnosis not present

## 2018-04-13 DIAGNOSIS — E559 Vitamin D deficiency, unspecified: Secondary | ICD-10-CM | POA: Diagnosis not present

## 2018-04-20 DIAGNOSIS — E559 Vitamin D deficiency, unspecified: Secondary | ICD-10-CM | POA: Diagnosis not present

## 2018-04-20 DIAGNOSIS — I2510 Atherosclerotic heart disease of native coronary artery without angina pectoris: Secondary | ICD-10-CM | POA: Diagnosis not present

## 2018-04-20 DIAGNOSIS — J449 Chronic obstructive pulmonary disease, unspecified: Secondary | ICD-10-CM | POA: Diagnosis not present

## 2018-04-20 DIAGNOSIS — J069 Acute upper respiratory infection, unspecified: Secondary | ICD-10-CM | POA: Diagnosis not present

## 2018-05-01 DIAGNOSIS — I2510 Atherosclerotic heart disease of native coronary artery without angina pectoris: Secondary | ICD-10-CM | POA: Diagnosis not present

## 2018-05-01 DIAGNOSIS — R609 Edema, unspecified: Secondary | ICD-10-CM | POA: Diagnosis not present

## 2018-05-01 DIAGNOSIS — E559 Vitamin D deficiency, unspecified: Secondary | ICD-10-CM | POA: Diagnosis not present

## 2018-05-01 DIAGNOSIS — M159 Polyosteoarthritis, unspecified: Secondary | ICD-10-CM | POA: Diagnosis not present

## 2018-05-04 ENCOUNTER — Telehealth: Payer: Self-pay

## 2018-05-04 MED ORDER — FUROSEMIDE 40 MG PO TABS: 40.0000 mg | ORAL_TABLET | Freq: Three times a day (TID) | ORAL | 2 refills | Status: DC

## 2018-05-04 NOTE — Telephone Encounter (Signed)
Patient states his legs her swelling and Dr.Lee was concerned that it may be due to circulation issues. He has chronic breathing problems but no exacerbation noticed lately. Patient is currently taking 40 mg lasix 3x's per day. He has had minimal weight gain but swelling is from knees down on both legs. Added for virtual visit tomorrow. Verbal consent given over phone. Patient instructed to have BP cuff and all medications on hand for reconciliation.

## 2018-05-05 ENCOUNTER — Encounter: Payer: Self-pay | Admitting: Cardiology

## 2018-05-05 ENCOUNTER — Other Ambulatory Visit: Payer: Self-pay

## 2018-05-05 ENCOUNTER — Telehealth: Payer: Self-pay | Admitting: Cardiology

## 2018-05-05 ENCOUNTER — Telehealth (INDEPENDENT_AMBULATORY_CARE_PROVIDER_SITE_OTHER): Payer: Medicare Other | Admitting: Cardiology

## 2018-05-05 VITALS — BP 122/67 | Ht 70.0 in | Wt 280.0 lb

## 2018-05-05 DIAGNOSIS — I10 Essential (primary) hypertension: Secondary | ICD-10-CM

## 2018-05-05 DIAGNOSIS — G4733 Obstructive sleep apnea (adult) (pediatric): Secondary | ICD-10-CM

## 2018-05-05 DIAGNOSIS — Z6841 Body Mass Index (BMI) 40.0 and over, adult: Secondary | ICD-10-CM

## 2018-05-05 DIAGNOSIS — Z86711 Personal history of pulmonary embolism: Secondary | ICD-10-CM

## 2018-05-05 DIAGNOSIS — R600 Localized edema: Secondary | ICD-10-CM

## 2018-05-05 MED ORDER — METOLAZONE 2.5 MG PO TABS: 2.5000 mg | ORAL_TABLET | Freq: Every day | ORAL | 1 refills | Status: DC

## 2018-05-05 NOTE — Progress Notes (Signed)
Virtual Visit via Video Note   This visit type was conducted due to national recommendations for restrictions regarding the COVID-19 Pandemic (e.g. social distancing) in an effort to limit this patient's exposure and mitigate transmission in our community.  Due to his co-morbid illnesses, this patient is at least at moderate risk for complications without adequate follow up.  This format is felt to be most appropriate for this patient at this time.  All issues noted in this document were discussed and addressed.  A limited physical exam was performed with this format.  Please refer to the patient's chart for his consent to telehealth for [facility].   Evaluation Performed:  Follow-up visit  Date:  05/05/2018   ID:  Glenn, Beck [birth year], MRN [id number]  Patient Location: Home Provider Location: Home  PCP:  Cher Nakai, MD  Cardiologist:  No primary care provider on file.  Electrophysiologist:  None   Chief Complaint: Pedal edema  History of Present Illness:    Glenn Beck is a 73 y.o. male with history of essential hypertension and morbid obesity.  He called our clinic mentioning that he has pedal edema.  He is was evaluated by video conferencing.  I repeatedly asked him whether he had sharp chest pain or shortness of breath and answered in the negative.  At the time of my evaluation, the patient is alert awake oriented and in no distress.  He has history of pulmonary embolism and is on anticoagulation.  The patient does not have symptoms concerning for COVID-19 infection (fever, chills, cough, or new shortness of breath).    Past Medical History:  Diagnosis Date  . Anxiety    takes Xanax daily as needed  . Arthritis    "all over my body"  . Asthma    bronchial  . Chronic back pain   . Complication of anesthesia    pt. reports swallowing has been difficult since having ACDF- 2016, has been eval. by ENT & testing done at [facility].   . Diverticulosis     takes Bentyl daily as needed  . GERD (gastroesophageal reflux disease)    takes Nexium daily as needed  . Glaucoma    uses eye drops  . Gout    on study medications for this  . Headache    r/t neck issues  . History of colon polyps    benign  . History of shingles   . Hypertension    takes Lisinopril and Metoprolol daily  . Hypothyroidism    takes Synthroid daily  . Insomnia    takes Triazolam nightly as needed  . Joint pain   . Kidney infection    beint treated at present with Amoxicillin   . Myocardial infarction ([place]) >56yrs ago  . Pneumonia 84yrs ago  . Shortness of breath dyspnea    with exertion  . Swallowing difficulty    has been evaluated at [facility] for this problem since Cervical Fusion   Past Surgical History:  Procedure Laterality Date  . ANTERIOR CERVICAL DECOMP/DISCECTOMY FUSION N/A 04/19/2014   Procedure: ANTERIOR CERVICAL DECOMPRESSION/DISCECTOMY FUSION CERVICAL THREE-FOUR CERVICAL FOUR-FIVE ,CERVICAL FIVE-SIX WITH INTERBODY PROTHESIS PLATING BONEGRAFT;  Surgeon: Consuella Lose, MD;  Location: [facility];  Service: Neurosurgery;  Laterality: N/A;  . BACK SURGERY     x 2;fusion  . CARDIAC CATHETERIZATION  >5yrs ago   /w angioplasty  . COLONOSCOPY    . ESOPHAGOGASTRODUODENOSCOPY    . EYE SURGERY Bilateral   .  HERNIA REPAIR Right    many yrs. ago   . KNEE ARTHROSCOPY Right   . TONSILLECTOMY    . TOTAL KNEE ARTHROPLASTY Right 12/26/2014   Procedure: TOTAL KNEE ARTHROPLASTY;  Surgeon: Vickey Huger, MD;  Location: [facility];  Service: Orthopedics;  Laterality: Right;     Current Meds  Medication Sig  . albuterol (PROVENTIL HFA;VENTOLIN HFA) 108 (90 BASE) MCG/ACT inhaler Inhale 2 puffs into the lungs every 6 (six) hours as needed for wheezing or shortness of breath.  Marland Kitchen apixaban (ELIQUIS) 5 MG TABS tablet Take 1 tablet (5 mg total) by mouth 2 (two) times daily.  Marland Kitchen esomeprazole (NEXIUM) 20 MG capsule Take 20 mg by mouth daily as needed (acid reflux).    . feeding supplement, ENSURE ENLIVE, (ENSURE ENLIVE) LIQD Take 237 mLs by mouth 2 (two) times daily between meals.  . furosemide (LASIX) 40 MG tablet Take 1 tablet (40 mg total) by mouth 3 (three) times daily.  Marland Kitchen HYDROcodone-acetaminophen (NORCO) 10-325 MG tablet Take 1 tablet by mouth 3 (three) times daily as needed (pain). for pain  . ipratropium-albuterol (DUONEB) 0.5-2.5 (3) MG/3ML SOLN Take 3 mLs by nebulization 4 (four) times daily as needed (shortness of breath/wheezing).   Marland Kitchen latanoprost (XALATAN) 0.005 % ophthalmic solution Place 1 drop into both eyes at bedtime.  Marland Kitchen levothyroxine (SYNTHROID, LEVOTHROID) 200 MCG tablet Take 200 mcg by mouth daily before breakfast.  . loratadine (CLARITIN) 10 MG tablet Take 10 mg by mouth daily as needed (seasonal allergies).   . metoprolol succinate (TOPROL-XL) 50 MG 24 hr tablet Take 1 tablet (50 mg total) by mouth daily.  . tamsulosin (FLOMAX) 0.4 MG CAPS capsule Take 0.4 mg by mouth daily as needed (infrequent urination).   . triazolam (HALCION) 0.25 MG tablet Take 0.125 mg by mouth at bedtime.      Allergies:   Ciprofloxacin; Nasacort [triamcinolone]; Novocain [procaine]; and Other   Social History   Tobacco Use  . Smoking status: Former Smoker    Packs/day: 1.50    Years: 40.00    Pack years: 60.00    Types: Cigarettes    Start date: 1961    Last attempt to quit: 2001    Years since quitting: 19.3  . Smokeless tobacco: Former Systems developer  . Tobacco comment: quit smoking in 2001  Substance Use Topics  . Alcohol use: No  . Drug use: No     Family Hx: The patient's family history is not on file.  ROS:   Please see the history of present illness.    Again patient denies any chest pain orthopnea or PND All other systems reviewed and are negative.   Prior CV studies:   The following studies were reviewed today:  Echocardiogram and stress test report was reviewed and discussed with the patient at length.  Labs/Other Tests and Data  Reviewed:    EKG:  No ECG reviewed.  Recent Labs: 08/07/2017: Hemoglobin 15.0; Platelets 228 08/22/2017: BUN 12; Creatinine, Ser 0.88; Potassium 4.8; Sodium 141 02/26/2018: NT-Pro BNP 73   Recent Lipid Panel No results found for: CHOL, TRIG, HDL, CHOLHDL, LDLCALC, LDLDIRECT  Wt Readings from Last 3 Encounters:  05/05/18 280 lb (127 kg)  02/26/18 278 lb 6.4 oz (126.3 kg)  08/22/17 279 lb (126.6 kg)     Objective:    Vital Signs:  BP 122/67 (BP Location: Left Arm, Patient Position: Sitting, Cuff Size: Normal)   Ht 5\' 10"  (1.778 m)   Wt 280 lb (127 kg)  BMI 40.18 kg/m    VITAL SIGNS:  reviewed  ASSESSMENT & PLAN:    1. Bilateral pedal edema: I evaluated him by video conferencing.  He bilaterally has 3-4+ pedal edema.  I will stop his furosemide at this time.  I will initiate him on Zaroxolyn 2.5 mg daily.  I would like him to get a Chem-7 in a week.  Fluid intake issues were also discussed.  He has history of pulmonary embolism but he tells me that he does not miss dose of his diuretic.  I would like to evaluate him back in a month and evaluate his blood work in a week. 2. He knows to go to the nearest emergency room for any significant concerns.  Patient had multiple questions which were answered to his satisfaction.  COVID-19 Education: The signs and symptoms of COVID-19 were discussed with the patient and how to seek care for testing (follow up with PCP or arrange E-visit).  The importance of social distancing was discussed today.  Time:   Today, I have spent 16 minutes with the patient with telehealth technology discussing the above problems.     Medication Adjustments/Labs and Tests Ordered: Current medicines are reviewed at length with the patient today.  Concerns regarding medicines are outlined above.   Tests Ordered: No orders of the defined types were placed in this encounter.   Medication Changes: No orders of the defined types were placed in this encounter.    Disposition:  Follow up in 1 month(s)  Signed, Jenean Lindau, MD  05/05/2018 11:29 AM    [place]

## 2018-05-05 NOTE — Telephone Encounter (Signed)
Glenn Beck obtained verbal consent// on triage call.     Virtual Visit Pre-Appointment Phone Call  "(Name), I am calling you today to discuss your upcoming appointment. We are currently trying to limit exposure to the virus that causes COVID-19 by seeing patients at home rather than in the office."  1. "What is the BEST phone number to call the day of the visit?" - include this in appointment notes  2. Do you have or have access to (through a family member/friend) a smartphone with video capability that we can use for your visit?" a. If yes - list this number in appt notes as cell (if different from BEST phone #) and list the appointment type as a VIDEO visit in appointment notes b. If no - list the appointment type as a PHONE visit in appointment notes  Confirm consent - "In the setting of the current Covid19 crisis, you are scheduled for a (phone or video) visit with your provider on (date) at (time).  Just as we do with many in-office visits, in order for you to participate in this visit, we must obtain consent.  If you'd like, I can send this to your mychart (if signed up) or email for you to review.  Otherwise, I can obtain your verbal consent now.  All virtual visits are billed to your insurance company just like a normal visit would be.  By agreeing to a virtual visit, we'd like you to understand that the technology does not allow for your provider to perform an examination, and thus may limit your provider's ability to fully assess your condition. If your provider identifies any concerns that need to be evaluated in person, we will make arrangements to do so.  Finally, though the technology is pretty good, we cannot assure that it will always work on either your or our end, and in the setting of a video visit, we may have to convert it to a phone-only visit.  In either situation, we cannot ensure that we have a secure connection.  Are you willing to proceed?" STAFF: Did the patient  verbally acknowledge consent to telehealth visit? Document YES/NO here: YES  3. Advise patient to be prepared - "Two hours prior to your appointment, go ahead and check your blood pressure, pulse, oxygen saturation, and your weight (if you have the equipment to check those) and write them all down. When your visit starts, your provider will ask you for this information. If you have an Apple Watch or Kardia device, please plan to have heart rate information ready on the day of your appointment. Please have a pen and paper handy nearby the day of the visit as well."  4. Give patient instructions for MyChart download to smartphone OR Doximity/Doxy.me as below if video visit (depending on what platform provider is using)  5. Inform patient they will receive a phone call 15 minutes prior to their appointment time (may be from unknown caller ID) so they should be prepared to answer    TELEPHONE CALL NOTE  Glenn Beck has been deemed a candidate for a follow-up tele-health visit to limit community exposure during the Covid-19 pandemic. I spoke with the patient via phone to ensure availability of phone/video source, confirm preferred email & phone number, and discuss instructions and expectations.  I reminded Glenn Beck to be prepared with any vital sign and/or heart rhythm information that could potentially be obtained via home monitoring, at the time of his visit. I reminded Glenn Beck to expect a phone call prior to his visit.  Glenn Beck 05/05/2018 10:49 AM   INSTRUCTIONS FOR DOWNLOADING THE MYCHART APP TO SMARTPHONE  - The patient must first make sure to have activated MyChart and know their login information - If Apple, go to [employer] and type in MyChart in the search bar and download the app. If Android, ask patient to go to [employer] and type in [place] in the search bar and download the app. The app is free but as with any other app downloads, their phone may  require them to verify saved payment information or Apple/Android password.  - The patient will need to then log into the app with their MyChart username and password, and select [facility] as their healthcare provider to link the account. When it is time for your visit, go to the MyChart app, find appointments, and click Begin Video Visit. Be sure to Select Allow for your device to access the Microphone and Camera for your visit. You will then be connected, and your provider will be with you shortly.  **If they have any issues connecting, or need assistance please contact MyChart service desk [phone])**  **If using a computer, in order to ensure the best quality for their visit they will need to use either of the following Internet Browsers: [employer], or Google Chrome**  IF USING DOXIMITY or DOXY.ME - The patient will receive a link just prior to their visit by text.     FULL LENGTH CONSENT FOR TELE-HEALTH VISIT   I hereby voluntarily request, consent and authorize [facility] and its employed or contracted physicians, physician assistants, nurse practitioners or other licensed health care professionals (the Practitioner), to provide me with telemedicine health care services (the Services") as deemed necessary by the treating Practitioner. I acknowledge and consent to receive the Services by the Practitioner via telemedicine. I understand that the telemedicine visit will involve communicating with the Practitioner through live audiovisual communication technology and the disclosure of certain medical information by electronic transmission. I acknowledge that I have been given the opportunity to request an in-person assessment or other available alternative prior to the telemedicine visit and am voluntarily participating in the telemedicine visit.  I understand that I have the right to withhold or withdraw my consent to the use of telemedicine in the course of my care at  any time, without affecting my right to future care or treatment, and that the Practitioner or I may terminate the telemedicine visit at any time. I understand that I have the right to inspect all information obtained and/or recorded in the course of the telemedicine visit and may receive copies of available information for a reasonable fee.  I understand that some of the potential risks of receiving the Services via telemedicine include:   Delay or interruption in medical evaluation due to technological equipment failure or disruption;  Information transmitted may not be sufficient (e.g. poor resolution of images) to allow for appropriate medical decision making by the Practitioner; and/or   In rare instances, security protocols could fail, causing a breach of personal health information.  Furthermore, I acknowledge that it is my responsibility to provide information about my medical history, conditions and care that is complete and accurate to the best of my ability. I acknowledge that Practitioner's advice, recommendations, and/or decision may be based on factors not within their control, such as incomplete or inaccurate data provided by me or distortions of diagnostic images  or specimens that may result from electronic transmissions. I understand that the practice of medicine is not an exact science and that Practitioner makes no warranties or guarantees regarding treatment outcomes. I acknowledge that I will receive a copy of this consent concurrently upon execution via email to the email address I last provided but may also request a printed copy by calling the office of [facility].    I understand that my insurance will be billed for this visit.   I have read or had this consent read to me.  I understand the contents of this consent, which adequately explains the benefits and risks of the Services being provided via telemedicine.   I have been provided ample opportunity to ask questions  regarding this consent and the Services and have had my questions answered to my satisfaction.  I give my informed consent for the services to be provided through the use of telemedicine in my medical care  By participating in this telemedicine visit I agree to the above.

## 2018-05-05 NOTE — Patient Instructions (Signed)
Medication Instructions:  STOP taking furosemide START taking metolazone 2.5 mg (1 tablet) daily, 1 to 2 hours after your normal morning medicines with a full glass of water.  If you need a refill on your cardiac medications before your next appointment, please call your pharmacy.   Lab work: NONE today. Patient agreed to come into office next week to have a BMP drawn between 8-4 MF except 12-1.  If you have labs (blood work) drawn today and your tests are completely normal, you will receive your results only by: Marland Kitchen MyChart Message (if you have MyChart) OR . A paper copy in the mail If you have any lab test that is abnormal or we need to change your treatment, we will call you to review the results.  Testing/Procedures: NONE  Follow-Up: At [facility], you and your health needs are our priority.  As part of our continuing mission to provide you with exceptional heart care, we have created designated Provider Care Teams.  These Care Teams include your primary Cardiologist (physician) and Advanced Practice Providers (APPs -  Physician Assistants and Nurse Practitioners) who all work together to provide you with the care you need, when you need it. You will need a follow up appointment in 1 months.     Any Other Special Instructions Will Be Listed Below     Metolazone tablets What is this medicine? METOLAZONE (me TOLE a zone) is a diuretic. It increases the amount of urine passed, which causes the body to lose salt and water. This medicine is used to treat high blood pressure. It is also reduces the swelling and water retention caused by heart or kidney disease. This medicine may be used for other purposes; ask your health care provider or pharmacist if you have questions. COMMON BRAND NAME(S): Mykrox, Zaroxolyn What should I tell my health care provider before I take this medicine? They need to know if you have any of these conditions: -diabetes -gout -immune system problems, like  lupus -kidney disease -liver disease -pancreatitis -small amount of urine or difficulty passing urine -an unusual or allergic reaction to metolazone, sulfa drugs, other medicines, foods, dyes, or preservatives -pregnant or trying to get pregnant -breast-feeding How should I use this medicine? Take this medicine by mouth with a glass of water. Follow the directions on the prescription label. Remember that you will need to pass urine frequently after taking this medicine. Do not take your doses at a time of day that will cause you problems. Do not take at bedtime. Take your medicine at regular intervals. Do not take your medicine more often than directed. Do not stop taking except on your doctor's advice. Talk to your pediatrician regarding the use of this medicine in children. Special care may be needed. Overdosage: If you think you have taken too much of this medicine contact a poison control center or emergency room at once. NOTE: This medicine is only for you. Do not share this medicine with others. What if I miss a dose? If you miss a dose, take it as soon as you can. If it is almost time for your next dose, take only that dose. Do not take double or extra doses. What may interact with this medicine? -alcohol -antiinflammatory drugs for pain or swelling -barbiturates for sleep or seizure control -digoxin -dofetilide -lithium -medicines for blood sugar -medicines for high blood pressure -medicines that relax muscles for surgery -methenamine -other diuretics -some medicines for pain -steroid hormones like cortisone, hydrocortisone, and prednisone -warfarin  This list may not describe all possible interactions. Give your health care provider a list of all the medicines, herbs, non-prescription drugs, or dietary supplements you use. Also tell them if you smoke, drink alcohol, or use illegal drugs. Some items may interact with your medicine. What should I watch for while using this  medicine? Visit your doctor or health care professional for regular checks on your progress. Check your blood pressure as directed. Ask your doctor or health care professional what your blood pressure should be and when you should contact him or her. You may need to be on a special diet while taking this medicine. Ask your doctor. Check with your doctor or health care professional if you get an attack of severe diarrhea, nausea and vomiting, or if you sweat a lot. The loss of too much body fluid can make it dangerous for you to take this medicine. You may get drowsy or dizzy. Do not drive, use machinery, or do anything that needs mental alertness until you know how this medicine affects you. Do not stand or sit up quickly, especially if you are an older patient. This reduces the risk of dizzy or fainting spells. Alcohol may interfere with the effect of this medicine. Avoid alcoholic drinks. This medicine may affect your blood sugar level. If you have diabetes, check with your doctor or health care professional before changing the dose of your diabetic medicine. This medicine can make you more sensitive to the sun. Keep out of the sun. If you cannot avoid being in the sun, wear protective clothing and use sunscreen. Do not use sun lamps or tanning beds/booths. What side effects may I notice from receiving this medicine? Side effects that you should report to your doctor or health care professional as soon as possible: -allergic reactions such as skin rash or itching, hives, swelling of the lips, mouth, tongue, or throat -fast or irregular heartbeat, chest pain -feeling faint -fever, chills -gout pain -hot red lump on leg -muscle pain, cramps -nausea, vomiting -numbness or tingling in hands, feet -pain or difficulty when passing urine -redness, blistering, peeling or loosening of the skin, including inside the mouth -unusual bleeding or bruising -unusually weak or tired -yellowing of the eyes,  skin Side effects that usually do not require medical attention (report to your doctor or health care professional if they continue or are bothersome): -abdominal pain -blurred vision -constipation or diarrhea -dry mouth -headache This list may not describe all possible side effects. Call your doctor for medical advice about side effects. You may report side effects to FDA at 1-800-FDA-1088. Where should I keep my medicine? Keep out of the reach of children. Store at room temperature between 15 and 30 degrees C (59 and 86 degrees F). Protect from light. Keep container tightly closed. Throw away any unused medicine after the expiration date. NOTE: This sheet is a summary. It may not cover all possible information. If you have questions about this medicine, talk to your doctor, pharmacist, or health care provider.  2019 Elsevier/Gold Standard (2007-07-20 14:11:48)

## 2018-05-05 NOTE — Addendum Note (Signed)
Addended by: Beckey Rutter on: 05/05/2018 12:32 PM   Modules accepted: Orders

## 2018-05-09 DIAGNOSIS — J449 Chronic obstructive pulmonary disease, unspecified: Secondary | ICD-10-CM | POA: Diagnosis not present

## 2018-05-09 DIAGNOSIS — R0902 Hypoxemia: Secondary | ICD-10-CM | POA: Diagnosis not present

## 2018-05-11 DIAGNOSIS — R600 Localized edema: Secondary | ICD-10-CM | POA: Diagnosis not present

## 2018-05-11 DIAGNOSIS — Z86711 Personal history of pulmonary embolism: Secondary | ICD-10-CM | POA: Diagnosis not present

## 2018-05-11 DIAGNOSIS — I10 Essential (primary) hypertension: Secondary | ICD-10-CM | POA: Diagnosis not present

## 2018-05-12 ENCOUNTER — Telehealth (INDEPENDENT_AMBULATORY_CARE_PROVIDER_SITE_OTHER): Payer: Medicare Other | Admitting: Cardiology

## 2018-05-12 ENCOUNTER — Encounter: Payer: Self-pay | Admitting: Cardiology

## 2018-05-12 ENCOUNTER — Telehealth: Payer: Self-pay

## 2018-05-12 ENCOUNTER — Other Ambulatory Visit: Payer: Self-pay

## 2018-05-12 VITALS — BP 129/65 | HR 78 | Ht 70.0 in | Wt 276.0 lb

## 2018-05-12 DIAGNOSIS — I10 Essential (primary) hypertension: Secondary | ICD-10-CM

## 2018-05-12 DIAGNOSIS — Z6841 Body Mass Index (BMI) 40.0 and over, adult: Secondary | ICD-10-CM

## 2018-05-12 DIAGNOSIS — G4733 Obstructive sleep apnea (adult) (pediatric): Secondary | ICD-10-CM

## 2018-05-12 DIAGNOSIS — Z86711 Personal history of pulmonary embolism: Secondary | ICD-10-CM

## 2018-05-12 DIAGNOSIS — R600 Localized edema: Secondary | ICD-10-CM

## 2018-05-12 LAB — BASIC METABOLIC PANEL
BUN/Creatinine Ratio: 12 (ref 10–24)
BUN: 11 mg/dL (ref 8–27)
CO2: 27 mmol/L (ref 20–29)
Calcium: 9.2 mg/dL (ref 8.6–10.2)
Chloride: 95 mmol/L — ABNORMAL LOW (ref 96–106)
Creatinine, Ser: 0.91 mg/dL (ref 0.76–1.27)
GFR calc Af Amer: 96 mL/min/{1.73_m2} (ref >59)
GFR calc non Af Amer: 83 mL/min/{1.73_m2} (ref >59)
Glucose: 92 mg/dL (ref 65–99)
Potassium: 3.6 mmol/L (ref 3.5–5.2)
Sodium: 138 mmol/L (ref 134–144)

## 2018-05-12 NOTE — Telephone Encounter (Signed)
-----   Message from Jenean Lindau, MD sent at 05/12/2018  8:12 AM EDT ----- The results of the study is unremarkable. Please inform patient. I will discuss in detail at next appointment. [facility]  primary care/referring physician Jenean Lindau, MD 05/12/2018 8:11 AM

## 2018-05-12 NOTE — Patient Instructions (Addendum)
Medication Instructions:  Your physician recommends that you continue on your current medications as directed. Please refer to the Current Medication list given to you today.  If you need a refill on your cardiac medications before your next appointment, please call your pharmacy.   Lab work: Patient will come in fasting on  05/25/18 To have a BMP drawn.  If you have labs (blood work) drawn today and your tests are completely normal, you will receive your results only by: Marland Kitchen MyChart Message (if you have MyChart) OR . A paper copy in the mail If you have any lab test that is abnormal or we need to change your treatment, we will call you to review the results.  Testing/Procedures: NONE  Follow-Up: At [facility], you and your health needs are our priority.  As part of our continuing mission to provide you with exceptional heart care, we have created designated Provider Care Teams.  These Care Teams include your primary Cardiologist (physician) and Advanced Practice Providers (APPs -  Physician Assistants and Nurse Practitioners) who all work together to provide you with the care you need, when you need it. You will need a follow up appointment in 2 weeks.

## 2018-05-12 NOTE — Telephone Encounter (Signed)
Dr.RRR reviewed results with patient during virtual visit today. Copy of results sent Dr.Lee per Dr.RRR request.

## 2018-05-12 NOTE — Addendum Note (Signed)
Addended by: Beckey Rutter on: 05/12/2018 12:00 PM   Modules accepted: Orders

## 2018-05-12 NOTE — Progress Notes (Signed)
Virtual Visit via Video Note   This visit type was conducted due to national recommendations for restrictions regarding the COVID-19 Pandemic (e.g. social distancing) in an effort to limit this patient's exposure and mitigate transmission in our community.  Due to his co-morbid illnesses, this patient is at least at moderate risk for complications without adequate follow up.  This format is felt to be most appropriate for this patient at this time.  All issues noted in this document were discussed and addressed.  A limited physical exam was performed with this format.  Please refer to the patient's chart for his consent to telehealth for [facility].   Evaluation Performed:  Follow-up visit  Date:  05/12/2018   ID:  Glenn Beck, Glenn Beck [birth year], MRN [id number]  Patient Location: Home Provider Location: Home  PCP:  Cher Nakai, MD  Cardiologist:  No primary care provider on file.  Electrophysiologist:  None   Chief Complaint: Bilateral pedal edema  History of Present Illness:    Glenn Beck is a 73 y.o. male with history of essential hypertension.  He was evaluated for significant bilateral pedal edema.  I discontinued his furosemide and put him on Zaroxolyn 2.5 mg daily.  Since then he has felt fine.  His legs have improved remarkably and he is happy about it.  The patient does not have symptoms concerning for COVID-19 infection (fever, chills, cough, or new shortness of breath).    Past Medical History:  Diagnosis Date  . Anxiety    takes Xanax daily as needed  . Arthritis    "all over my body"  . Asthma    bronchial  . Chronic back pain   . Complication of anesthesia    pt. reports swallowing has been difficult since having ACDF- 2016, has been eval. by ENT & testing done at [facility].   . Diverticulosis    takes Bentyl daily as needed  . GERD (gastroesophageal reflux disease)    takes Nexium daily as needed  . Glaucoma    uses eye drops  . Gout    on  study medications for this  . Headache    r/t neck issues  . History of colon polyps    benign  . History of shingles   . Hypertension    takes Lisinopril and Metoprolol daily  . Hypothyroidism    takes Synthroid daily  . Insomnia    takes Triazolam nightly as needed  . Joint pain   . Kidney infection    beint treated at present with Amoxicillin   . Myocardial infarction ([place]) >51yrs ago  . Pneumonia 97yrs ago  . Shortness of breath dyspnea    with exertion  . Swallowing difficulty    has been evaluated at [facility]. for this problem since Cervical Fusion   Past Surgical History:  Procedure Laterality Date  . ANTERIOR CERVICAL DECOMP/DISCECTOMY FUSION N/A 04/19/2014   Procedure: ANTERIOR CERVICAL DECOMPRESSION/DISCECTOMY FUSION CERVICAL THREE-FOUR CERVICAL FOUR-FIVE ,CERVICAL FIVE-SIX WITH INTERBODY PROTHESIS PLATING BONEGRAFT;  Surgeon: Consuella Lose, MD;  Location: [place] [facility];  Service: Neurosurgery;  Laterality: N/A;  . BACK SURGERY     x 2;fusion  . CARDIAC CATHETERIZATION  >67yrs ago   /w angioplasty  . COLONOSCOPY    . ESOPHAGOGASTRODUODENOSCOPY    . EYE SURGERY Bilateral   . HERNIA REPAIR Right    many yrs. ago   . KNEE ARTHROSCOPY Right   . TONSILLECTOMY    . TOTAL KNEE ARTHROPLASTY Right  12/26/2014   Procedure: TOTAL KNEE ARTHROPLASTY;  Surgeon: Vickey Huger, MD;  Location: [place];  Service: Orthopedics;  Laterality: Right;     Current Meds  Medication Sig  . albuterol (PROVENTIL HFA;VENTOLIN HFA) 108 (90 BASE) MCG/ACT inhaler Inhale 2 puffs into the lungs every 6 (six) hours as needed for wheezing or shortness of breath.  Marland Kitchen apixaban (ELIQUIS) 5 MG TABS tablet Take 1 tablet (5 mg total) by mouth 2 (two) times daily.  Marland Kitchen esomeprazole (NEXIUM) 20 MG capsule Take 20 mg by mouth daily as needed (acid reflux).   . feeding supplement, ENSURE ENLIVE, (ENSURE ENLIVE) LIQD Take 237 mLs by mouth 2 (two) times daily between meals.  Marland Kitchen HYDROcodone-acetaminophen (NORCO)  10-325 MG tablet Take 1 tablet by mouth 3 (three) times daily as needed (pain). for pain  . ipratropium-albuterol (DUONEB) 0.5-2.5 (3) MG/3ML SOLN Take 3 mLs by nebulization 4 (four) times daily as needed (shortness of breath/wheezing).   Marland Kitchen levothyroxine (SYNTHROID, LEVOTHROID) 200 MCG tablet Take 200 mcg by mouth daily before breakfast.  . loratadine (CLARITIN) 10 MG tablet Take 10 mg by mouth daily as needed (seasonal allergies).   . metolazone (ZAROXOLYN) 2.5 MG tablet Take 1 tablet (2.5 mg total) by mouth daily.  . metoprolol succinate (TOPROL-XL) 50 MG 24 hr tablet Take 1 tablet (50 mg total) by mouth daily.  . tamsulosin (FLOMAX) 0.4 MG CAPS capsule Take 0.4 mg by mouth daily as needed (infrequent urination).   . triazolam (HALCION) 0.25 MG tablet Take 0.125 mg by mouth at bedtime.      Allergies:   Ciprofloxacin; Nasacort [triamcinolone]; Novocain [procaine]; and Other   Social History   Tobacco Use  . Smoking status: Former Smoker    Packs/day: 1.50    Years: 40.00    Pack years: 60.00    Types: Cigarettes    Start date: 1961    Last attempt to quit: 2001    Years since quitting: 19.3  . Smokeless tobacco: Former Systems developer  . Tobacco comment: quit smoking in 2001  Substance Use Topics  . Alcohol use: No  . Drug use: No     Family Hx: The patient's family history is not on file.  ROS:   Please see the history of present illness.    No chest pain orthopnea or PND All other systems reviewed and are negative.   Prior CV studies:   The following studies were reviewed today:  A Chem-7 done yesterday was unremarkable  Labs/Other Tests and Data Reviewed:    EKG:  No ECG reviewed.  Recent Labs: 08/07/2017: Hemoglobin 15.0; Platelets 228 02/26/2018: NT-Pro BNP 73 05/11/2018: BUN 11; Creatinine, Ser 0.91; Potassium 3.6; Sodium 138   Recent Lipid Panel No results found for: CHOL, TRIG, HDL, CHOLHDL, LDLCALC, LDLDIRECT  Wt Readings from Last 3 Encounters:  05/12/18 276 lb  (125.2 kg)  05/05/18 280 lb (127 kg)  02/26/18 278 lb 6.4 oz (126.3 kg)     Objective:    Vital Signs:  BP 129/65 (BP Location: Left Arm, Patient Position: Sitting, Cuff Size: Normal)   Pulse 78   Ht 5\' 10"  (1.778 m)   Wt 276 lb (125.2 kg)   BMI 39.60 kg/m    VITAL SIGNS:  reviewed  ASSESSMENT & PLAN:    1. Bilateral pedal edema: This has subsided significantly since he began Zaroxolyn.  I reviewed his Chem-7 from yesterday and it was fine.  He will continue current medications.  He will see me in follow-up  appointment in 2 weeks.  One 1 day prior to that appointment he will come to our office and get a Chem-7.  His blood pressure is stable. 2. I told him several other measures such as keeping his feet elevated.  He will also wear or rather continue to wear compression stockings. 3. Diet was discussed for obesity and risks of obesity explained and he vocalized understanding.  COVID-19 Education: The signs and symptoms of COVID-19 were discussed with the patient and how to seek care for testing (follow up with PCP or arrange E-visit).  The importance of social distancing was discussed today.  Time:   Today, I have spent 15 minutes with the patient with telehealth technology discussing the above problems.     Medication Adjustments/Labs and Tests Ordered: Current medicines are reviewed at length with the patient today.  Concerns regarding medicines are outlined above.   Tests Ordered: No orders of the defined types were placed in this encounter.   Medication Changes: No orders of the defined types were placed in this encounter.   Disposition:  Follow up in 2 week(s)  Signed, Jenean Lindau, MD  05/12/2018 11:18 AM    [place]

## 2018-05-13 DIAGNOSIS — I2510 Atherosclerotic heart disease of native coronary artery without angina pectoris: Secondary | ICD-10-CM | POA: Diagnosis not present

## 2018-05-13 DIAGNOSIS — R609 Edema, unspecified: Secondary | ICD-10-CM | POA: Diagnosis not present

## 2018-05-13 DIAGNOSIS — J208 Acute bronchitis due to other specified organisms: Secondary | ICD-10-CM | POA: Diagnosis not present

## 2018-05-13 DIAGNOSIS — J449 Chronic obstructive pulmonary disease, unspecified: Secondary | ICD-10-CM | POA: Diagnosis not present

## 2018-05-13 DIAGNOSIS — E559 Vitamin D deficiency, unspecified: Secondary | ICD-10-CM | POA: Diagnosis not present

## 2018-05-26 ENCOUNTER — Other Ambulatory Visit: Payer: Self-pay

## 2018-05-26 ENCOUNTER — Telehealth (INDEPENDENT_AMBULATORY_CARE_PROVIDER_SITE_OTHER): Payer: Medicare Other | Admitting: Cardiology

## 2018-05-26 ENCOUNTER — Encounter: Payer: Self-pay | Admitting: Cardiology

## 2018-05-26 VITALS — BP 133/69 | HR 79 | Ht 70.0 in | Wt 280.0 lb

## 2018-05-26 DIAGNOSIS — I10 Essential (primary) hypertension: Secondary | ICD-10-CM

## 2018-05-26 DIAGNOSIS — G4733 Obstructive sleep apnea (adult) (pediatric): Secondary | ICD-10-CM

## 2018-05-26 DIAGNOSIS — Z86711 Personal history of pulmonary embolism: Secondary | ICD-10-CM

## 2018-05-26 NOTE — Patient Instructions (Signed)
Medication Instructions:  Your physician recommends that you continue on your current medications as directed. Please refer to the Current Medication list given to you today.  If you need a refill on your cardiac medications before your next appointment, please call your pharmacy.   Lab work: Your physician recommends that you return for lab work in:  1 MONTH BMP  If you have labs (blood work) drawn today and your tests are completely normal, you will receive your results only by: Marland Kitchen MyChart Message (if you have MyChart) OR . A paper copy in the mail If you have any lab test that is abnormal or we need to change your treatment, we will call you to review the results.  Testing/Procedures: None  Follow-Up: At [facility], you and your health needs are our priority.  As part of our continuing mission to provide you with exceptional heart care, we have created designated Provider Care Teams.  These Care Teams include your primary Cardiologist (physician) and Advanced Practice Providers (APPs -  Physician Assistants and Nurse Practitioners) who all work together to provide you with the care you need, when you need it. You will need a follow up appointment in 1 months.  Any Other Special Instructions Will Be Listed Below (If Applicable).

## 2018-05-26 NOTE — Progress Notes (Signed)
Virtual Visit via Video Note   This visit type was conducted due to national recommendations for restrictions regarding the COVID-19 Pandemic (e.g. social distancing) in an effort to limit this patient's exposure and mitigate transmission in our community.  Due to his co-morbid illnesses, this patient is at least at moderate risk for complications without adequate follow up.  This format is felt to be most appropriate for this patient at this time.  All issues noted in this document were discussed and addressed.  A limited physical exam was performed with this format.  Please refer to the patient's chart for his consent to telehealth for [facility].   Date:  05/26/2018   ID:  Glenn Beck, DOB Jun 05, 1945, MRN [id number]  Patient Location: Home Provider Location: Home  PCP:  Cher Nakai, MD  Cardiologist:  No primary care provider on file.  Electrophysiologist:  None   Evaluation Performed:  Follow-Up Visit  Chief Complaint:  Pedal edema  History of Present Illness:    Glenn Beck is a 73 y.o. male with past medical history of essential hypertension.  He was evaluated for significant pedal edema and was initiated on Zaroxolyn.  He is done very well with that and is happy with it.  He is not short of breath and he takes care of activities of daily living.  He is working on losing weight.  He tells me that he takes Zaroxolyn only 2-3 times or at best 4-5 times a week and when he does he uses potassium pill.  At the time of my evaluation, the patient is alert awake oriented and in no distress.  The patient does not have symptoms concerning for COVID-19 infection (fever, chills, cough, or new shortness of breath).    Past Medical History:  Diagnosis Date  . Anxiety    takes Xanax daily as needed  . Arthritis    "all over my body"  . Asthma    bronchial  . Chronic back pain   . Complication of anesthesia    pt. reports swallowing has been difficult since having ACDF-  2016, has been eval. by ENT & testing done at [facility].   . Diverticulosis    takes Bentyl daily as needed  . GERD (gastroesophageal reflux disease)    takes Nexium daily as needed  . Glaucoma    uses eye drops  . Gout    on study medications for this  . Headache    r/t neck issues  . History of colon polyps    benign  . History of shingles   . Hypertension    takes Lisinopril and Metoprolol daily  . Hypothyroidism    takes Synthroid daily  . Insomnia    takes Triazolam nightly as needed  . Joint pain   . Kidney infection    beint treated at present with Amoxicillin   . Myocardial infarction ([place]) >21yrs ago  . Pneumonia 22yrs ago  . Shortness of breath dyspnea    with exertion  . Swallowing difficulty    has been evaluated at [facility]. for this problem since Cervical Fusion   Past Surgical History:  Procedure Laterality Date  . ANTERIOR CERVICAL DECOMP/DISCECTOMY FUSION N/A 04/19/2014   Procedure: ANTERIOR CERVICAL DECOMPRESSION/DISCECTOMY FUSION CERVICAL THREE-FOUR CERVICAL FOUR-FIVE ,CERVICAL FIVE-SIX WITH INTERBODY PROTHESIS PLATING BONEGRAFT;  Surgeon: Consuella Lose, MD;  Location: [place] [facility];  Service: Neurosurgery;  Laterality: N/A;  . BACK SURGERY     x 2;fusion  . CARDIAC  CATHETERIZATION  >42yrs ago   /w angioplasty  . COLONOSCOPY    . ESOPHAGOGASTRODUODENOSCOPY    . EYE SURGERY Bilateral   . HERNIA REPAIR Right    many yrs. ago   . KNEE ARTHROSCOPY Right   . TONSILLECTOMY    . TOTAL KNEE ARTHROPLASTY Right 12/26/2014   Procedure: TOTAL KNEE ARTHROPLASTY;  Surgeon: Vickey Huger, MD;  Location: [place];  Service: Orthopedics;  Laterality: Right;     Current Meds  Medication Sig  . albuterol (PROVENTIL HFA;VENTOLIN HFA) 108 (90 BASE) MCG/ACT inhaler Inhale 2 puffs into the lungs every 6 (six) hours as needed for wheezing or shortness of breath.  Marland Kitchen apixaban (ELIQUIS) 5 MG TABS tablet Take 1 tablet (5 mg total) by mouth 2 (two) times daily.  Marland Kitchen  esomeprazole (NEXIUM) 20 MG capsule Take 20 mg by mouth daily as needed (acid reflux).   . feeding supplement, ENSURE ENLIVE, (ENSURE ENLIVE) LIQD Take 237 mLs by mouth 2 (two) times daily between meals.  Marland Kitchen HYDROcodone-acetaminophen (NORCO) 10-325 MG tablet Take 1 tablet by mouth 3 (three) times daily as needed (pain). for pain  . ipratropium-albuterol (DUONEB) 0.5-2.5 (3) MG/3ML SOLN Take 3 mLs by nebulization 4 (four) times daily as needed (shortness of breath/wheezing).   Marland Kitchen levothyroxine (SYNTHROID, LEVOTHROID) 200 MCG tablet Take 200 mcg by mouth daily before breakfast.  . loratadine (CLARITIN) 10 MG tablet Take 10 mg by mouth daily as needed (seasonal allergies).   . metolazone (ZAROXOLYN) 2.5 MG tablet Take 1 tablet (2.5 mg total) by mouth daily.  . metoprolol succinate (TOPROL-XL) 50 MG 24 hr tablet Take 1 tablet (50 mg total) by mouth daily.  . tamsulosin (FLOMAX) 0.4 MG CAPS capsule Take 0.4 mg by mouth daily as needed (infrequent urination).   . triazolam (HALCION) 0.25 MG tablet Take 0.125 mg by mouth at bedtime.      Allergies:   Ciprofloxacin; Nasacort [triamcinolone]; Novocain [procaine]; and Other   Social History   Tobacco Use  . Smoking status: Former Smoker    Packs/day: 1.50    Years: 40.00    Pack years: 60.00    Types: Cigarettes    Start date: 1961    Last attempt to quit: 2001    Years since quitting: 19.3  . Smokeless tobacco: Former Systems developer  . Tobacco comment: quit smoking in 2001  Substance Use Topics  . Alcohol use: No  . Drug use: No     Family Hx: The patient's family history is not on file.  ROS:   Please see the history of present illness.    None significant as mentioned above All other systems reviewed and are negative.   Prior CV studies:   The following studies were reviewed today:  Blood work was reviewed  Labs/Other Tests and Data Reviewed:    EKG:  No ECG reviewed.  Recent Labs: 08/07/2017: Hemoglobin 15.0; Platelets 228 02/26/2018:  NT-Pro BNP 73 05/11/2018: BUN 11; Creatinine, Ser 0.91; Potassium 3.6; Sodium 138   Recent Lipid Panel No results found for: CHOL, TRIG, HDL, CHOLHDL, LDLCALC, LDLDIRECT  Wt Readings from Last 3 Encounters:  05/26/18 280 lb (127 kg)  05/12/18 276 lb (125.2 kg)  05/05/18 280 lb (127 kg)     Objective:    Vital Signs:  BP 133/69 (BP Location: Left Arm, Patient Position: Sitting, Cuff Size: Normal)   Pulse 79   Ht 5\' 10"  (1.778 m)   Wt 280 lb (127 kg)   BMI 40.18 kg/m  VITAL SIGNS:  reviewed  ASSESSMENT & PLAN:    1. Essential hypertension: Stable at this time.  Diet was discussed.  I reviewed his blood work.  He is on diuretic therapy and tolerating it well.  He tells me that he takes this tablet Zaroxolyn 2.5 mg about 4-5 times a week and is fine with it.  When he takes it he takes a potassium pill.  Patient is happy with his follow-up.  He is trying to lose weight and dieting well.  He weighs himself on a regular basis.  He will be seen in follow-up appointment in a month or earlier if he has any concerns.  He had multiple questions which were answered to his satisfaction.  Blood work also was discussed with him.  We will repeat blood work in a month.  COVID-19 Education: The signs and symptoms of COVID-19 were discussed with the patient and how to seek care for testing (follow up with PCP or arrange E-visit).  The importance of social distancing was discussed today.  Time:   Today, I have spent 16 minutes with the patient with telehealth technology discussing the above problems.     Medication Adjustments/Labs and Tests Ordered: Current medicines are reviewed at length with the patient today.  Concerns regarding medicines are outlined above.   Tests Ordered: No orders of the defined types were placed in this encounter.   Medication Changes: No orders of the defined types were placed in this encounter.   Disposition:  Follow up in 1 month(s)  Signed, Jenean Lindau,  MD  05/26/2018 10:59 AM    [place]

## 2018-05-26 NOTE — Addendum Note (Signed)
Addended by: Particia Nearing B on: 05/26/2018 11:10 AM   Modules accepted: Orders

## 2018-05-29 DIAGNOSIS — M159 Polyosteoarthritis, unspecified: Secondary | ICD-10-CM | POA: Diagnosis not present

## 2018-05-29 DIAGNOSIS — E559 Vitamin D deficiency, unspecified: Secondary | ICD-10-CM | POA: Diagnosis not present

## 2018-05-29 DIAGNOSIS — E538 Deficiency of other specified B group vitamins: Secondary | ICD-10-CM | POA: Diagnosis not present

## 2018-05-29 DIAGNOSIS — R609 Edema, unspecified: Secondary | ICD-10-CM | POA: Diagnosis not present

## 2018-05-29 DIAGNOSIS — I2510 Atherosclerotic heart disease of native coronary artery without angina pectoris: Secondary | ICD-10-CM | POA: Diagnosis not present

## 2018-05-29 DIAGNOSIS — E039 Hypothyroidism, unspecified: Secondary | ICD-10-CM | POA: Diagnosis not present

## 2018-05-29 DIAGNOSIS — E785 Hyperlipidemia, unspecified: Secondary | ICD-10-CM | POA: Diagnosis not present

## 2018-05-29 DIAGNOSIS — I10 Essential (primary) hypertension: Secondary | ICD-10-CM | POA: Diagnosis not present

## 2018-06-08 DIAGNOSIS — R0902 Hypoxemia: Secondary | ICD-10-CM | POA: Diagnosis not present

## 2018-06-08 DIAGNOSIS — J449 Chronic obstructive pulmonary disease, unspecified: Secondary | ICD-10-CM | POA: Diagnosis not present

## 2018-06-09 DIAGNOSIS — I2510 Atherosclerotic heart disease of native coronary artery without angina pectoris: Secondary | ICD-10-CM | POA: Diagnosis not present

## 2018-06-09 DIAGNOSIS — J208 Acute bronchitis due to other specified organisms: Secondary | ICD-10-CM | POA: Diagnosis not present

## 2018-06-09 DIAGNOSIS — R609 Edema, unspecified: Secondary | ICD-10-CM | POA: Diagnosis not present

## 2018-06-09 DIAGNOSIS — M159 Polyosteoarthritis, unspecified: Secondary | ICD-10-CM | POA: Diagnosis not present

## 2018-06-26 DIAGNOSIS — E559 Vitamin D deficiency, unspecified: Secondary | ICD-10-CM | POA: Diagnosis not present

## 2018-06-26 DIAGNOSIS — R609 Edema, unspecified: Secondary | ICD-10-CM | POA: Diagnosis not present

## 2018-06-26 DIAGNOSIS — M159 Polyosteoarthritis, unspecified: Secondary | ICD-10-CM | POA: Diagnosis not present

## 2018-06-26 DIAGNOSIS — E538 Deficiency of other specified B group vitamins: Secondary | ICD-10-CM | POA: Diagnosis not present

## 2018-06-26 DIAGNOSIS — I2510 Atherosclerotic heart disease of native coronary artery without angina pectoris: Secondary | ICD-10-CM | POA: Diagnosis not present

## 2018-07-03 ENCOUNTER — Other Ambulatory Visit: Payer: Self-pay | Admitting: Cardiology

## 2018-07-09 ENCOUNTER — Encounter: Payer: Self-pay | Admitting: Cardiology

## 2018-07-09 ENCOUNTER — Telehealth (INDEPENDENT_AMBULATORY_CARE_PROVIDER_SITE_OTHER): Payer: Medicare Other | Admitting: Cardiology

## 2018-07-09 ENCOUNTER — Other Ambulatory Visit: Payer: Self-pay

## 2018-07-09 VITALS — BP 134/68 | HR 55 | Ht 70.0 in | Wt 275.0 lb

## 2018-07-09 DIAGNOSIS — Z86711 Personal history of pulmonary embolism: Secondary | ICD-10-CM

## 2018-07-09 DIAGNOSIS — J449 Chronic obstructive pulmonary disease, unspecified: Secondary | ICD-10-CM | POA: Diagnosis not present

## 2018-07-09 DIAGNOSIS — R0902 Hypoxemia: Secondary | ICD-10-CM | POA: Diagnosis not present

## 2018-07-09 DIAGNOSIS — Z7189 Other specified counseling: Secondary | ICD-10-CM

## 2018-07-09 DIAGNOSIS — I10 Essential (primary) hypertension: Secondary | ICD-10-CM

## 2018-07-09 DIAGNOSIS — Z13220 Encounter for screening for lipoid disorders: Secondary | ICD-10-CM

## 2018-07-09 DIAGNOSIS — Z1329 Encounter for screening for other suspected endocrine disorder: Secondary | ICD-10-CM

## 2018-07-09 NOTE — Patient Instructions (Signed)
Medication Instructions:  Your physician recommends that you continue on your current medications as directed. Please refer to the Current Medication list given to you today.  If you need a refill on your cardiac medications before your next appointment, please call your pharmacy.   Lab work: Your physician recommends that you come in FASTING for lipid, liver, TSH, CBC and BMP to be drawn.   If you have labs (blood work) drawn today and your tests are completely normal, you will receive your results only by: Marland Kitchen MyChart Message (if you have MyChart) OR . A paper copy in the mail If you have any lab test that is abnormal or we need to change your treatment, we will call you to review the results.  Testing/Procedures: NONE  Follow-Up: At [facility], you and your health needs are our priority.  As part of our continuing mission to provide you with exceptional heart care, we have created designated Provider Care Teams.  These Care Teams include your primary Cardiologist (physician) and Advanced Practice Providers (APPs -  Physician Assistants and Nurse Practitioners) who all work together to provide you with the care you need, when you need it. You will need a follow up appointment in 4 months.

## 2018-07-09 NOTE — Progress Notes (Signed)
Virtual Visit via Video Note   This visit type was conducted due to national recommendations for restrictions regarding the COVID-19 Pandemic (e.g. social distancing) in an effort to limit this patient's exposure and mitigate transmission in our community.  Due to his co-morbid illnesses, this patient is at least at moderate risk for complications without adequate follow up.  This format is felt to be most appropriate for this patient at this time.  All issues noted in this document were discussed and addressed.  A limited physical exam was performed with this format.  Please refer to the patient's chart for his consent to telehealth for [facility].   Date:  07/09/2018   ID:  Glenn Beck, DOB 11-04-1945, MRN [id number]  Patient Location: Home Provider Location: Office  PCP:  Cher Nakai, MD  Cardiologist:  Jenean Lindau, MD  Electrophysiologist:  None   Evaluation Performed:  Follow-Up Visit  Chief Complaint: Essential hypertension and pedal edema  History of Present Illness:    Glenn Beck is a 73 y.o. male with past medical history of essential hypertension and morbid obesity.  He denies any problems at this time and takes care of activities of daily living.  He leads a sedentary lifestyle because of morbid obesity.  He mentioned this to me that Glenn Beck is keeping his pedal edema at a very low level and he is happy about it.  At the time of my evaluation, the patient is alert awake oriented and in no distress.  The patient does not have symptoms concerning for COVID-19 infection (fever, chills, cough, or new shortness of breath).    Past Medical History:  Diagnosis Date  . Anxiety    takes Xanax daily as needed  . Arthritis    "all over my body"  . Asthma    bronchial  . Chronic back pain   . Complication of anesthesia    pt. reports swallowing has been difficult since having ACDF- 2016, has been eval. by ENT & testing done at [facility].   .  Diverticulosis    takes Bentyl daily as needed  . GERD (gastroesophageal reflux disease)    takes Nexium daily as needed  . Glaucoma    uses eye drops  . Gout    on study medications for this  . Headache    r/t neck issues  . History of colon polyps    benign  . History of shingles   . Hypertension    takes Lisinopril and Metoprolol daily  . Hypothyroidism    takes Synthroid daily  . Insomnia    takes Triazolam nightly as needed  . Joint pain   . Kidney infection    beint treated at present with Amoxicillin   . Myocardial infarction ([place]) >18yrs ago  . Pneumonia 66yrs ago  . Shortness of breath dyspnea    with exertion  . Swallowing difficulty    has been evaluated at [facility] for this problem since Cervical Fusion   Past Surgical History:  Procedure Laterality Date  . ANTERIOR CERVICAL DECOMP/DISCECTOMY FUSION N/A 04/19/2014   Procedure: ANTERIOR CERVICAL DECOMPRESSION/DISCECTOMY FUSION CERVICAL THREE-FOUR CERVICAL FOUR-FIVE ,CERVICAL FIVE-SIX WITH INTERBODY PROTHESIS PLATING BONEGRAFT;  Surgeon: Consuella Lose, MD;  Location: [facility];  Service: Neurosurgery;  Laterality: N/A;  . BACK SURGERY     x 2;fusion  . CARDIAC CATHETERIZATION  >74yrs ago   /w angioplasty  . COLONOSCOPY    . ESOPHAGOGASTRODUODENOSCOPY    . EYE SURGERY  Bilateral   . HERNIA REPAIR Right    many yrs. ago   . KNEE ARTHROSCOPY Right   . TONSILLECTOMY    . TOTAL KNEE ARTHROPLASTY Right 12/26/2014   Procedure: TOTAL KNEE ARTHROPLASTY;  Surgeon: Vickey Huger, MD;  Location: [facility];  Service: Orthopedics;  Laterality: Right;     Current Meds  Medication Sig  . albuterol (PROVENTIL HFA;VENTOLIN HFA) 108 (90 BASE) MCG/ACT inhaler Inhale 2 puffs into the lungs every 6 (six) hours as needed for wheezing or shortness of breath.  Marland Kitchen apixaban (ELIQUIS) 5 MG TABS tablet Take 1 tablet (5 mg total) by mouth 2 (two) times daily.  Marland Kitchen esomeprazole (NEXIUM) 20 MG capsule Take 20 mg by mouth daily as needed  (acid reflux).   . feeding supplement, ENSURE ENLIVE, (ENSURE ENLIVE) LIQD Take 237 mLs by mouth 2 (two) times daily between meals.  Marland Kitchen HYDROcodone-acetaminophen (NORCO) 10-325 MG tablet Take 1 tablet by mouth 3 (three) times daily as needed (pain). for pain  . ipratropium-albuterol (DUONEB) 0.5-2.5 (3) MG/3ML SOLN Take 3 mLs by nebulization 4 (four) times daily as needed (shortness of breath/wheezing).   Marland Kitchen levothyroxine (SYNTHROID, LEVOTHROID) 200 MCG tablet Take 200 mcg by mouth daily before breakfast.  . loratadine (CLARITIN) 10 MG tablet Take 10 mg by mouth daily as needed (seasonal allergies).   . metolazone (ZAROXOLYN) 2.5 MG tablet Take 2.5 mg by mouth every other day.  . metoprolol succinate (TOPROL-XL) 50 MG 24 hr tablet Take 1 tablet (50 mg total) by mouth daily.  . tamsulosin (FLOMAX) 0.4 MG CAPS capsule Take 0.4 mg by mouth daily as needed (infrequent urination).   . triazolam (HALCION) 0.25 MG tablet Take 0.125 mg by mouth at bedtime.   . [DISCONTINUED] metolazone (ZAROXOLYN) 2.5 MG tablet Take 1 tablet (2.5 mg total) by mouth daily.     Allergies:   Ciprofloxacin, Nasacort [triamcinolone], Novocain [procaine], and Other   Social History   Tobacco Use  . Smoking status: Former Smoker    Packs/day: 1.50    Years: 40.00    Pack years: 60.00    Types: Cigarettes    Start date: 1961    Quit date: 2001    Years since quitting: 19.4  . Smokeless tobacco: Former Systems developer  . Tobacco comment: quit smoking in 2001  Substance Use Topics  . Alcohol use: No  . Drug use: No     Family Hx: The patient's family history is not on file.  ROS:   Please see the history of present illness.    As mentioned above All other systems reviewed and are negative.   Prior CV studies:   The following studies were reviewed today:  Blood work report was reviewed  Labs/Other Tests and Data Reviewed:    EKG:  No ECG reviewed.  Recent Labs: 08/07/2017: Hemoglobin 15.0; Platelets 228  02/26/2018: NT-Pro BNP 73 05/11/2018: BUN 11; Creatinine, Ser 0.91; Potassium 3.6; Sodium 138   Recent Lipid Panel No results found for: CHOL, TRIG, HDL, CHOLHDL, LDLCALC, LDLDIRECT  Wt Readings from Last 3 Encounters:  07/09/18 275 lb (124.7 kg)  05/26/18 280 lb (127 kg)  05/12/18 276 lb (125.2 kg)     Objective:    Vital Signs:  Ht 5\' 10"  (1.778 m)   Wt 275 lb (124.7 kg)   BMI 39.46 kg/m    VITAL SIGNS:  reviewed  ASSESSMENT & PLAN:    1. Essential hypertension: I discussed my findings with the patient at extensive length.  His  blood pressure is stable.  His pedal edema is resolved.  He is on diuretic therapy.  He tells me that he has not had blood work done for a long time and I will get him here in the next few days for a Chem-7 CBC TSH and liver lipid check. 2. Morbid obesity: Diet was discussed for obesity and risks of obesity explained and he vocalized understanding.  He plans to comply and do better to reduce weight. 3. Patient will be seen in follow-up appointment in 4 months or earlier if the patient has any concerns.  Patient has history of sleep apnea and for him to get him referred for sleep studies but is not keen on it at this time and will discuss this in his appointment with me in 4 months.  COVID-19 Education: The signs and symptoms of COVID-19 were discussed with the patient and how to seek care for testing (follow up with PCP or arrange E-visit).  The importance of social distancing was discussed today.  Time:   Today, I have spent 15 minutes with the patient with telehealth technology discussing the above problems.     Medication Adjustments/Labs and Tests Ordered: Current medicines are reviewed at length with the patient today.  Concerns regarding medicines are outlined above.   Tests Ordered: No orders of the defined types were placed in this encounter.   Medication Changes: No orders of the defined types were placed in this encounter.   Follow Up:   In Person in 4 month(s)  Signed, Jenean Lindau, MD  07/09/2018 11:24 AM    [place]

## 2018-07-09 NOTE — Addendum Note (Signed)
Addended by: Beckey Rutter on: 07/09/2018 12:31 PM   Modules accepted: Orders

## 2018-07-13 DIAGNOSIS — I10 Essential (primary) hypertension: Secondary | ICD-10-CM | POA: Diagnosis not present

## 2018-07-13 DIAGNOSIS — Z13220 Encounter for screening for lipoid disorders: Secondary | ICD-10-CM | POA: Diagnosis not present

## 2018-07-13 DIAGNOSIS — Z86711 Personal history of pulmonary embolism: Secondary | ICD-10-CM | POA: Diagnosis not present

## 2018-07-13 DIAGNOSIS — Z1329 Encounter for screening for other suspected endocrine disorder: Secondary | ICD-10-CM | POA: Diagnosis not present

## 2018-07-14 ENCOUNTER — Telehealth: Payer: Self-pay

## 2018-07-14 LAB — HEPATIC FUNCTION PANEL
ALT: 17 IU/L (ref 0–44)
AST: 15 IU/L (ref 0–40)
Albumin: 3.8 g/dL (ref 3.7–4.7)
Alkaline Phosphatase: 61 IU/L (ref 39–117)
Bilirubin Total: 0.3 mg/dL (ref 0.0–1.2)
Bilirubin, Direct: 0.1 mg/dL (ref 0.00–0.40)
Total Protein: 6 g/dL (ref 6.0–8.5)

## 2018-07-14 LAB — LIPID PANEL
Chol/HDL Ratio: 3.7 ratio (ref 0.0–5.0)
Cholesterol, Total: 130 mg/dL (ref 100–199)
HDL: 35 mg/dL — ABNORMAL LOW (ref >39)
LDL Calculated: 70 mg/dL (ref 0–99)
Triglycerides: 126 mg/dL (ref 0–149)
VLDL Cholesterol Cal: 25 mg/dL (ref 5–40)

## 2018-07-14 LAB — CBC
Hematocrit: 45.6 % (ref 37.5–51.0)
Hemoglobin: 15.3 g/dL (ref 13.0–17.7)
MCH: 31.2 pg (ref 26.6–33.0)
MCHC: 33.6 g/dL (ref 31.5–35.7)
MCV: 93 fL (ref 79–97)
Platelets: 272 10*3/uL (ref 150–450)
RBC: 4.9 x10E6/uL (ref 4.14–5.80)
RDW: 12.3 % (ref 11.6–15.4)
WBC: 9.1 10*3/uL (ref 3.4–10.8)

## 2018-07-14 LAB — BASIC METABOLIC PANEL
BUN/Creatinine Ratio: 16 (ref 10–24)
BUN: 16 mg/dL (ref 8–27)
CO2: 26 mmol/L (ref 20–29)
Calcium: 9.3 mg/dL (ref 8.6–10.2)
Chloride: 95 mmol/L — ABNORMAL LOW (ref 96–106)
Creatinine, Ser: 0.97 mg/dL (ref 0.76–1.27)
GFR calc Af Amer: 88 mL/min/{1.73_m2} (ref >59)
GFR calc non Af Amer: 76 mL/min/{1.73_m2} (ref >59)
Glucose: 91 mg/dL (ref 65–99)
Potassium: 3.7 mmol/L (ref 3.5–5.2)
Sodium: 138 mmol/L (ref 134–144)

## 2018-07-14 LAB — TSH: TSH: 0.041 u[IU]/mL — ABNORMAL LOW (ref 0.450–4.500)

## 2018-07-14 NOTE — Telephone Encounter (Signed)
Glenn Beck spoke with Glenn Beck and informed her of abnormal results/incoming fax.

## 2018-07-14 NOTE — Telephone Encounter (Signed)
-----   Message from Jenean Lindau, MD sent at 07/14/2018  7:50 AM EDT ----- Labs are fine but thyroid is significantly abnormal. Have him call his pcp for this and talk to pcp nurse  And document. Cc pcp Jenean Lindau, MD 07/14/2018 7:49 AM

## 2018-07-14 NOTE — Telephone Encounter (Signed)
Glenn Beck spoke with Joellen Jersey at Dr.Lee's office and left message for patient to call back for results.Copy sent to Dr. Truman Hayward office per Dr. Docia Furl request

## 2018-07-15 NOTE — Telephone Encounter (Signed)
Information relayed to patient and he was informed that RN had spoken with Dr.Lee's office. No further questions at this time.

## 2018-07-24 DIAGNOSIS — R609 Edema, unspecified: Secondary | ICD-10-CM | POA: Diagnosis not present

## 2018-07-24 DIAGNOSIS — L0390 Cellulitis, unspecified: Secondary | ICD-10-CM | POA: Diagnosis not present

## 2018-07-24 DIAGNOSIS — E559 Vitamin D deficiency, unspecified: Secondary | ICD-10-CM | POA: Diagnosis not present

## 2018-07-24 DIAGNOSIS — M159 Polyosteoarthritis, unspecified: Secondary | ICD-10-CM | POA: Diagnosis not present

## 2018-07-24 DIAGNOSIS — I2510 Atherosclerotic heart disease of native coronary artery without angina pectoris: Secondary | ICD-10-CM | POA: Diagnosis not present

## 2018-07-24 DIAGNOSIS — E039 Hypothyroidism, unspecified: Secondary | ICD-10-CM | POA: Diagnosis not present

## 2018-08-07 DIAGNOSIS — I2699 Other pulmonary embolism without acute cor pulmonale: Secondary | ICD-10-CM | POA: Diagnosis not present

## 2018-08-07 DIAGNOSIS — E559 Vitamin D deficiency, unspecified: Secondary | ICD-10-CM | POA: Diagnosis not present

## 2018-08-07 DIAGNOSIS — I2510 Atherosclerotic heart disease of native coronary artery without angina pectoris: Secondary | ICD-10-CM | POA: Diagnosis not present

## 2018-08-07 DIAGNOSIS — E538 Deficiency of other specified B group vitamins: Secondary | ICD-10-CM | POA: Diagnosis not present

## 2018-08-07 DIAGNOSIS — R609 Edema, unspecified: Secondary | ICD-10-CM | POA: Diagnosis not present

## 2018-08-08 DIAGNOSIS — J449 Chronic obstructive pulmonary disease, unspecified: Secondary | ICD-10-CM | POA: Diagnosis not present

## 2018-08-08 DIAGNOSIS — R0902 Hypoxemia: Secondary | ICD-10-CM | POA: Diagnosis not present

## 2018-08-13 DIAGNOSIS — E559 Vitamin D deficiency, unspecified: Secondary | ICD-10-CM | POA: Diagnosis not present

## 2018-08-13 DIAGNOSIS — I2510 Atherosclerotic heart disease of native coronary artery without angina pectoris: Secondary | ICD-10-CM | POA: Diagnosis not present

## 2018-08-13 DIAGNOSIS — E538 Deficiency of other specified B group vitamins: Secondary | ICD-10-CM | POA: Diagnosis not present

## 2018-08-13 DIAGNOSIS — K4090 Unilateral inguinal hernia, without obstruction or gangrene, not specified as recurrent: Secondary | ICD-10-CM | POA: Diagnosis not present

## 2018-08-13 DIAGNOSIS — R609 Edema, unspecified: Secondary | ICD-10-CM | POA: Diagnosis not present

## 2018-08-14 DIAGNOSIS — E538 Deficiency of other specified B group vitamins: Secondary | ICD-10-CM | POA: Diagnosis not present

## 2018-08-14 DIAGNOSIS — K4090 Unilateral inguinal hernia, without obstruction or gangrene, not specified as recurrent: Secondary | ICD-10-CM | POA: Diagnosis not present

## 2018-08-14 DIAGNOSIS — R609 Edema, unspecified: Secondary | ICD-10-CM | POA: Diagnosis not present

## 2018-08-14 DIAGNOSIS — I2510 Atherosclerotic heart disease of native coronary artery without angina pectoris: Secondary | ICD-10-CM | POA: Diagnosis not present

## 2018-08-14 DIAGNOSIS — E559 Vitamin D deficiency, unspecified: Secondary | ICD-10-CM | POA: Diagnosis not present

## 2018-08-21 DIAGNOSIS — R609 Edema, unspecified: Secondary | ICD-10-CM | POA: Diagnosis not present

## 2018-08-21 DIAGNOSIS — J208 Acute bronchitis due to other specified organisms: Secondary | ICD-10-CM | POA: Diagnosis not present

## 2018-08-21 DIAGNOSIS — I2510 Atherosclerotic heart disease of native coronary artery without angina pectoris: Secondary | ICD-10-CM | POA: Diagnosis not present

## 2018-08-21 DIAGNOSIS — E538 Deficiency of other specified B group vitamins: Secondary | ICD-10-CM | POA: Diagnosis not present

## 2018-08-21 DIAGNOSIS — E559 Vitamin D deficiency, unspecified: Secondary | ICD-10-CM | POA: Diagnosis not present

## 2018-09-08 DIAGNOSIS — R0902 Hypoxemia: Secondary | ICD-10-CM | POA: Diagnosis not present

## 2018-09-08 DIAGNOSIS — J449 Chronic obstructive pulmonary disease, unspecified: Secondary | ICD-10-CM | POA: Diagnosis not present

## 2018-09-17 DIAGNOSIS — L309 Dermatitis, unspecified: Secondary | ICD-10-CM | POA: Diagnosis not present

## 2018-09-17 DIAGNOSIS — E538 Deficiency of other specified B group vitamins: Secondary | ICD-10-CM | POA: Diagnosis not present

## 2018-09-17 DIAGNOSIS — E559 Vitamin D deficiency, unspecified: Secondary | ICD-10-CM | POA: Diagnosis not present

## 2018-09-17 DIAGNOSIS — R609 Edema, unspecified: Secondary | ICD-10-CM | POA: Diagnosis not present

## 2018-09-17 DIAGNOSIS — I2510 Atherosclerotic heart disease of native coronary artery without angina pectoris: Secondary | ICD-10-CM | POA: Diagnosis not present

## 2018-09-23 DIAGNOSIS — H40013 Open angle with borderline findings, low risk, bilateral: Secondary | ICD-10-CM | POA: Diagnosis not present

## 2018-09-23 DIAGNOSIS — H0102A Squamous blepharitis right eye, upper and lower eyelids: Secondary | ICD-10-CM | POA: Diagnosis not present

## 2018-09-23 DIAGNOSIS — H0102B Squamous blepharitis left eye, upper and lower eyelids: Secondary | ICD-10-CM | POA: Diagnosis not present

## 2018-09-29 DIAGNOSIS — R609 Edema, unspecified: Secondary | ICD-10-CM | POA: Diagnosis not present

## 2018-09-29 DIAGNOSIS — I10 Essential (primary) hypertension: Secondary | ICD-10-CM | POA: Diagnosis not present

## 2018-09-29 DIAGNOSIS — J069 Acute upper respiratory infection, unspecified: Secondary | ICD-10-CM | POA: Diagnosis not present

## 2018-09-29 DIAGNOSIS — E559 Vitamin D deficiency, unspecified: Secondary | ICD-10-CM | POA: Diagnosis not present

## 2018-09-29 DIAGNOSIS — E785 Hyperlipidemia, unspecified: Secondary | ICD-10-CM | POA: Diagnosis not present

## 2018-09-29 DIAGNOSIS — L309 Dermatitis, unspecified: Secondary | ICD-10-CM | POA: Diagnosis not present

## 2018-09-29 DIAGNOSIS — I2510 Atherosclerotic heart disease of native coronary artery without angina pectoris: Secondary | ICD-10-CM | POA: Diagnosis not present

## 2018-10-08 DIAGNOSIS — H04123 Dry eye syndrome of bilateral lacrimal glands: Secondary | ICD-10-CM | POA: Diagnosis not present

## 2018-10-08 DIAGNOSIS — H40003 Preglaucoma, unspecified, bilateral: Secondary | ICD-10-CM | POA: Diagnosis not present

## 2018-10-09 DIAGNOSIS — J449 Chronic obstructive pulmonary disease, unspecified: Secondary | ICD-10-CM | POA: Diagnosis not present

## 2018-10-09 DIAGNOSIS — R0902 Hypoxemia: Secondary | ICD-10-CM | POA: Diagnosis not present

## 2018-11-08 DIAGNOSIS — R0902 Hypoxemia: Secondary | ICD-10-CM | POA: Diagnosis not present

## 2018-11-08 DIAGNOSIS — J449 Chronic obstructive pulmonary disease, unspecified: Secondary | ICD-10-CM | POA: Diagnosis not present

## 2018-11-10 ENCOUNTER — Ambulatory Visit: Payer: Medicare Other | Admitting: Cardiology

## 2018-11-18 DIAGNOSIS — E559 Vitamin D deficiency, unspecified: Secondary | ICD-10-CM | POA: Diagnosis not present

## 2018-11-18 DIAGNOSIS — E538 Deficiency of other specified B group vitamins: Secondary | ICD-10-CM | POA: Diagnosis not present

## 2018-11-18 DIAGNOSIS — L309 Dermatitis, unspecified: Secondary | ICD-10-CM | POA: Diagnosis not present

## 2018-11-18 DIAGNOSIS — R609 Edema, unspecified: Secondary | ICD-10-CM | POA: Diagnosis not present

## 2018-11-18 DIAGNOSIS — I2510 Atherosclerotic heart disease of native coronary artery without angina pectoris: Secondary | ICD-10-CM | POA: Diagnosis not present

## 2018-11-24 ENCOUNTER — Other Ambulatory Visit: Payer: Self-pay

## 2018-11-24 ENCOUNTER — Ambulatory Visit (INDEPENDENT_AMBULATORY_CARE_PROVIDER_SITE_OTHER): Payer: Medicare Other | Admitting: Cardiology

## 2018-11-24 ENCOUNTER — Encounter: Payer: Self-pay | Admitting: Cardiology

## 2018-11-24 VITALS — BP 130/70 | HR 73 | Ht 70.0 in | Wt 275.0 lb

## 2018-11-24 DIAGNOSIS — I10 Essential (primary) hypertension: Secondary | ICD-10-CM

## 2018-11-24 DIAGNOSIS — E039 Hypothyroidism, unspecified: Secondary | ICD-10-CM | POA: Diagnosis not present

## 2018-11-24 DIAGNOSIS — Z86711 Personal history of pulmonary embolism: Secondary | ICD-10-CM | POA: Diagnosis not present

## 2018-11-24 NOTE — Addendum Note (Signed)
Addended by: Beckey Rutter on: 11/24/2018 02:33 PM   Modules accepted: Orders

## 2018-11-24 NOTE — Progress Notes (Signed)
Cardiology Office Note:    Date:  11/24/2018   ID:  Glenn Beck, DOB 03-Apr-1945, MRN [id number]  PCP:  Cher Nakai, MD  Cardiologist:  Jenean Lindau, MD   Referring MD: Cher Nakai, MD    ASSESSMENT:    1. Essential hypertension   2. History of pulmonary embolism   3. Morbid obesity ([place])   4. Hypothyroidism, unspecified type    PLAN:    In order of problems listed above:  1. Primary prevention stressed with the patient.  Importance of compliance with diet and medication stressed and he vocalized understanding. 2. Essential hypertension: Blood pressure stable.  We will check electrolytes today and TSH also. 3. History of pulmonary embolism: He is on anticoagulations.  Benefits and potential risks explained and he vocalized understanding. 4. Morbid obesity: Diet was explained importance of regular exercise stressed risks of obesity explained and he vocalized understanding 5. Patient will be seen in follow-up appointment in 6 months or earlier if the patient has any concerns    Medication Adjustments/Labs and Tests Ordered: Current medicines are reviewed at length with the patient today.  Concerns regarding medicines are outlined above.  No orders of the defined types were placed in this encounter.  No orders of the defined types were placed in this encounter.    No chief complaint on file.    History of Present Illness:    Glenn Beck is a 73 y.o. male.  Patient has history of essential hypertension, history of pulmonary embolism and hypothyroidism.  He is morbidly obese.  He denies any problems at this time and takes care of activities of daily living.  No chest pain orthopnea or PND.  Overall he leads a sedentary lifestyle.  At the time of my evaluation, the patient is alert awake oriented and in no distress.  Past Medical History:  Diagnosis Date  . Anxiety    takes Xanax daily as needed  . Arthritis    "all over my body"  . Asthma    bronchial   . Chronic back pain   . Complication of anesthesia    pt. reports swallowing has been difficult since having ACDF- 2016, has been eval. by ENT & testing done at [facility].   . Diverticulosis    takes Bentyl daily as needed  . GERD (gastroesophageal reflux disease)    takes Nexium daily as needed  . Glaucoma    uses eye drops  . Gout    on study medications for this  . Headache    r/t neck issues  . History of colon polyps    benign  . History of shingles   . Hypertension    takes Lisinopril and Metoprolol daily  . Hypothyroidism    takes Synthroid daily  . Insomnia    takes Triazolam nightly as needed  . Joint pain   . Kidney infection    beint treated at present with Amoxicillin   . Myocardial infarction ([place]) >61yrs ago  . Pneumonia 87yrs ago  . Shortness of breath dyspnea    with exertion  . Swallowing difficulty    has been evaluated at [facility]. for this problem since Cervical Fusion    Past Surgical History:  Procedure Laterality Date  . ANTERIOR CERVICAL DECOMP/DISCECTOMY FUSION N/A 04/19/2014   Procedure: ANTERIOR CERVICAL DECOMPRESSION/DISCECTOMY FUSION CERVICAL THREE-FOUR CERVICAL FOUR-FIVE ,CERVICAL FIVE-SIX WITH INTERBODY PROTHESIS PLATING BONEGRAFT;  Surgeon: Consuella Lose, MD;  Location: [facility];  Service: Neurosurgery;  Laterality: N/A;  . BACK SURGERY     x 2;fusion  . CARDIAC CATHETERIZATION  >89yrs ago   /w angioplasty  . COLONOSCOPY    . ESOPHAGOGASTRODUODENOSCOPY    . EYE SURGERY Bilateral   . HERNIA REPAIR Right    many yrs. ago   . KNEE ARTHROSCOPY Right   . TONSILLECTOMY    . TOTAL KNEE ARTHROPLASTY Right 12/26/2014   Procedure: TOTAL KNEE ARTHROPLASTY;  Surgeon: Vickey Huger, MD;  Location: [facility];  Service: Orthopedics;  Laterality: Right;    Current Medications: Current Meds  Medication Sig  . albuterol (PROVENTIL HFA;VENTOLIN HFA) 108 (90 BASE) MCG/ACT inhaler Inhale 2 puffs into the lungs every 6 (six) hours as needed  for wheezing or shortness of breath.  Marland Kitchen apixaban (ELIQUIS) 5 MG TABS tablet Take 1 tablet (5 mg total) by mouth 2 (two) times daily.  Marland Kitchen esomeprazole (NEXIUM) 20 MG capsule Take 20 mg by mouth daily as needed (acid reflux).   . feeding supplement, ENSURE ENLIVE, (ENSURE ENLIVE) LIQD Take 237 mLs by mouth 2 (two) times daily between meals.  Marland Kitchen HYDROcodone-acetaminophen (NORCO) 10-325 MG tablet Take 1 tablet by mouth 3 (three) times daily as needed (pain). for pain  . ipratropium-albuterol (DUONEB) 0.5-2.5 (3) MG/3ML SOLN Take 3 mLs by nebulization 4 (four) times daily as needed (shortness of breath/wheezing).   Marland Kitchen levothyroxine (SYNTHROID, LEVOTHROID) 200 MCG tablet Take 200 mcg by mouth daily before breakfast.  . loratadine (CLARITIN) 10 MG tablet Take 10 mg by mouth daily as needed (seasonal allergies).   . metolazone (ZAROXOLYN) 2.5 MG tablet Take 2.5 mg by mouth every other day.  . metoprolol succinate (TOPROL-XL) 50 MG 24 hr tablet Take 1 tablet (50 mg total) by mouth daily.  . tamsulosin (FLOMAX) 0.4 MG CAPS capsule Take 0.4 mg by mouth daily as needed (infrequent urination).   . triazolam (HALCION) 0.25 MG tablet Take 0.125 mg by mouth at bedtime.      Allergies:   Ciprofloxacin, Nasacort [triamcinolone], Novocain [procaine], and Other   Social History   Socioeconomic History  . Marital status: Widowed    Spouse name: Not on file  . Number of children: Not on file  . Years of education: Not on file  . Highest education level: Not on file  Occupational History  . Occupation: Regulatory affairs officer  Social Needs  . Financial resource strain: Patient refused  . Food insecurity    Worry: Patient refused    Inability: Patient refused  . Transportation needs    Medical: Patient refused    Non-medical: Patient refused  Tobacco Use  . Smoking status: Former Smoker    Packs/day: 1.50    Years: 40.00    Pack years: 60.00    Types: Cigarettes    Start date: 1961    Quit date: 2001     Years since quitting: 19.8  . Smokeless tobacco: Former Systems developer  . Tobacco comment: quit smoking in 2001  Substance and Sexual Activity  . Alcohol use: No  . Drug use: No  . Sexual activity: Not Currently  Lifestyle  . Physical activity    Days per week: Patient refused    Minutes per session: Patient refused  . Stress: Patient refused  Relationships  . Social Herbalist on phone: Patient refused    Gets together: Patient refused    Attends religious service: Patient refused    Active member of club or organization: Patient refused    Attends meetings  of clubs or organizations: Patient refused    Relationship status: Patient refused  Other Topics Concern  . Not on file  Social History Narrative   No social documentation.     Family History: The patient's family history is not on file.  ROS:   Please see the history of present illness.    All other systems reviewed and are negative.  EKGs/Labs/Other Studies Reviewed:    The following studies were reviewed today: EKG was sinus rhythm and nonspecific ST-T changes.   Recent Labs: 02/26/2018: NT-Pro BNP 73 07/13/2018: ALT 17; BUN 16; Creatinine, Ser 0.97; Hemoglobin 15.3; Platelets 272; Potassium 3.7; Sodium 138; TSH 0.041  Recent Lipid Panel    Component Value Date/Time   CHOL 130 07/13/2018 1002   TRIG 126 07/13/2018 1002   HDL 35 (L) 07/13/2018 1002   CHOLHDL 3.7 07/13/2018 1002   LDLCALC 70 07/13/2018 1002    Physical Exam:    VS:  BP 130/70   Pulse 73   Ht 5\' 10"  (1.778 m)   Wt 275 lb (124.7 kg)   SpO2 98%   BMI 39.46 kg/m     Wt Readings from Last 3 Encounters:  11/24/18 275 lb (124.7 kg)  07/09/18 275 lb (124.7 kg)  05/26/18 280 lb (127 kg)     GEN: Patient is in no acute distress HEENT: Normal NECK: No JVD; No carotid bruits LYMPHATICS: No lymphadenopathy CARDIAC: Hear sounds regular, 2/6 systolic murmur at the apex. RESPIRATORY:  Clear to auscultation without rales, wheezing or rhonchi   ABDOMEN: Soft, non-tender, non-distended MUSCULOSKELETAL:  No edema; No deformity  SKIN: Warm and dry NEUROLOGIC:  Alert and oriented x 3 PSYCHIATRIC:  Normal affect   Signed, Jenean Lindau, MD  11/24/2018 2:25 PM    [facility]

## 2018-11-24 NOTE — Patient Instructions (Signed)
Medication Instructions:  Your physician recommends that you continue on your current medications as directed. Please refer to the Current Medication list given to you today.  *If you need a refill on your cardiac medications before your next appointment, please call your pharmacy*  Lab Work: Your physician recommends that you have a BMP and TSH drawn today  If you have labs (blood work) drawn today and your tests are completely normal, you will receive your results only by: Marland Kitchen MyChart Message (if you have MyChart) OR . A paper copy in the mail If you have any lab test that is abnormal or we need to change your treatment, we will call you to review the results.  Testing/Procedures: You had an EKG performed today  Follow-Up: At [facility], you and your health needs are our priority.  As part of our continuing mission to provide you with exceptional heart care, we have created designated Provider Care Teams.  These Care Teams include your primary Cardiologist (physician) and Advanced Practice Providers (APPs -  Physician Assistants and Nurse Practitioners) who all work together to provide you with the care you need, when you need it.  Your next appointment:   6 months  The format for your next appointment:   In Person  Provider:   Jyl Heinz, MD

## 2018-11-25 ENCOUNTER — Ambulatory Visit: Payer: Medicare Other | Admitting: Cardiology

## 2018-11-25 LAB — BASIC METABOLIC PANEL
BUN/Creatinine Ratio: 13 (ref 10–24)
BUN: 11 mg/dL (ref 8–27)
CO2: 27 mmol/L (ref 20–29)
Calcium: 9.2 mg/dL (ref 8.6–10.2)
Chloride: 97 mmol/L (ref 96–106)
Creatinine, Ser: 0.82 mg/dL (ref 0.76–1.27)
GFR calc Af Amer: 100 mL/min/{1.73_m2} (ref >59)
GFR calc non Af Amer: 87 mL/min/{1.73_m2} (ref >59)
Glucose: 102 mg/dL — ABNORMAL HIGH (ref 65–99)
Potassium: 3.9 mmol/L (ref 3.5–5.2)
Sodium: 138 mmol/L (ref 134–144)

## 2018-11-25 LAB — TSH: TSH: 0.029 u[IU]/mL — ABNORMAL LOW (ref 0.450–4.500)

## 2018-11-26 ENCOUNTER — Telehealth: Payer: Self-pay

## 2018-11-26 NOTE — Telephone Encounter (Signed)
-----   Message from Jenean Lindau, MD sent at 11/25/2018  4:36 PM EST ----- He needs to call his primary care physician for the thyroid test abnormality.  Chem-7 is unremarkable.  Please send copy to primary care Dr. Josepha Pigg, MD 11/25/2018 4:36 PM

## 2018-11-26 NOTE — Telephone Encounter (Signed)
Results relayed, copy to Dr. Truman Hayward

## 2018-12-07 DIAGNOSIS — I2510 Atherosclerotic heart disease of native coronary artery without angina pectoris: Secondary | ICD-10-CM | POA: Diagnosis not present

## 2018-12-07 DIAGNOSIS — E559 Vitamin D deficiency, unspecified: Secondary | ICD-10-CM | POA: Diagnosis not present

## 2018-12-07 DIAGNOSIS — L309 Dermatitis, unspecified: Secondary | ICD-10-CM | POA: Diagnosis not present

## 2018-12-07 DIAGNOSIS — M159 Polyosteoarthritis, unspecified: Secondary | ICD-10-CM | POA: Diagnosis not present

## 2018-12-07 DIAGNOSIS — R609 Edema, unspecified: Secondary | ICD-10-CM | POA: Diagnosis not present

## 2018-12-07 DIAGNOSIS — E039 Hypothyroidism, unspecified: Secondary | ICD-10-CM | POA: Diagnosis not present

## 2018-12-09 DIAGNOSIS — J449 Chronic obstructive pulmonary disease, unspecified: Secondary | ICD-10-CM | POA: Diagnosis not present

## 2018-12-09 DIAGNOSIS — R0902 Hypoxemia: Secondary | ICD-10-CM | POA: Diagnosis not present

## 2018-12-21 DIAGNOSIS — R609 Edema, unspecified: Secondary | ICD-10-CM | POA: Diagnosis not present

## 2018-12-21 DIAGNOSIS — E559 Vitamin D deficiency, unspecified: Secondary | ICD-10-CM | POA: Diagnosis not present

## 2018-12-21 DIAGNOSIS — L309 Dermatitis, unspecified: Secondary | ICD-10-CM | POA: Diagnosis not present

## 2018-12-21 DIAGNOSIS — I2510 Atherosclerotic heart disease of native coronary artery without angina pectoris: Secondary | ICD-10-CM | POA: Diagnosis not present

## 2018-12-21 DIAGNOSIS — M25511 Pain in right shoulder: Secondary | ICD-10-CM | POA: Diagnosis not present

## 2018-12-29 DIAGNOSIS — E559 Vitamin D deficiency, unspecified: Secondary | ICD-10-CM | POA: Diagnosis not present

## 2018-12-29 DIAGNOSIS — I2510 Atherosclerotic heart disease of native coronary artery without angina pectoris: Secondary | ICD-10-CM | POA: Diagnosis not present

## 2018-12-29 DIAGNOSIS — L309 Dermatitis, unspecified: Secondary | ICD-10-CM | POA: Diagnosis not present

## 2018-12-29 DIAGNOSIS — M25512 Pain in left shoulder: Secondary | ICD-10-CM | POA: Diagnosis not present

## 2018-12-29 DIAGNOSIS — R609 Edema, unspecified: Secondary | ICD-10-CM | POA: Diagnosis not present

## 2019-01-06 ENCOUNTER — Other Ambulatory Visit: Payer: Self-pay | Admitting: Cardiology

## 2019-01-08 DIAGNOSIS — J449 Chronic obstructive pulmonary disease, unspecified: Secondary | ICD-10-CM | POA: Diagnosis not present

## 2019-01-08 DIAGNOSIS — R0902 Hypoxemia: Secondary | ICD-10-CM | POA: Diagnosis not present

## 2019-01-13 DIAGNOSIS — Z9181 History of falling: Secondary | ICD-10-CM | POA: Diagnosis not present

## 2019-01-13 DIAGNOSIS — E785 Hyperlipidemia, unspecified: Secondary | ICD-10-CM | POA: Diagnosis not present

## 2019-01-13 DIAGNOSIS — Z0000 Encounter for general adult medical examination without abnormal findings: Secondary | ICD-10-CM | POA: Diagnosis not present

## 2019-01-21 DIAGNOSIS — E559 Vitamin D deficiency, unspecified: Secondary | ICD-10-CM | POA: Diagnosis not present

## 2019-01-21 DIAGNOSIS — I2510 Atherosclerotic heart disease of native coronary artery without angina pectoris: Secondary | ICD-10-CM | POA: Diagnosis not present

## 2019-01-21 DIAGNOSIS — M25512 Pain in left shoulder: Secondary | ICD-10-CM | POA: Diagnosis not present

## 2019-01-21 DIAGNOSIS — L309 Dermatitis, unspecified: Secondary | ICD-10-CM | POA: Diagnosis not present

## 2019-01-21 DIAGNOSIS — R609 Edema, unspecified: Secondary | ICD-10-CM | POA: Diagnosis not present

## 2019-01-28 DIAGNOSIS — R609 Edema, unspecified: Secondary | ICD-10-CM | POA: Diagnosis not present

## 2019-01-28 DIAGNOSIS — M159 Polyosteoarthritis, unspecified: Secondary | ICD-10-CM | POA: Diagnosis not present

## 2019-01-28 DIAGNOSIS — L0390 Cellulitis, unspecified: Secondary | ICD-10-CM | POA: Diagnosis not present

## 2019-01-28 DIAGNOSIS — L309 Dermatitis, unspecified: Secondary | ICD-10-CM | POA: Diagnosis not present

## 2019-01-28 DIAGNOSIS — I2510 Atherosclerotic heart disease of native coronary artery without angina pectoris: Secondary | ICD-10-CM | POA: Diagnosis not present

## 2019-02-08 DIAGNOSIS — J449 Chronic obstructive pulmonary disease, unspecified: Secondary | ICD-10-CM | POA: Diagnosis not present

## 2019-02-08 DIAGNOSIS — R0902 Hypoxemia: Secondary | ICD-10-CM | POA: Diagnosis not present

## 2019-02-09 DIAGNOSIS — L0390 Cellulitis, unspecified: Secondary | ICD-10-CM | POA: Diagnosis not present

## 2019-02-09 DIAGNOSIS — I2510 Atherosclerotic heart disease of native coronary artery without angina pectoris: Secondary | ICD-10-CM | POA: Diagnosis not present

## 2019-02-09 DIAGNOSIS — R609 Edema, unspecified: Secondary | ICD-10-CM | POA: Diagnosis not present

## 2019-02-09 DIAGNOSIS — M159 Polyosteoarthritis, unspecified: Secondary | ICD-10-CM | POA: Diagnosis not present

## 2019-02-09 DIAGNOSIS — L309 Dermatitis, unspecified: Secondary | ICD-10-CM | POA: Diagnosis not present

## 2019-02-23 DIAGNOSIS — M159 Polyosteoarthritis, unspecified: Secondary | ICD-10-CM | POA: Diagnosis not present

## 2019-02-23 DIAGNOSIS — I2510 Atherosclerotic heart disease of native coronary artery without angina pectoris: Secondary | ICD-10-CM | POA: Diagnosis not present

## 2019-02-23 DIAGNOSIS — R609 Edema, unspecified: Secondary | ICD-10-CM | POA: Diagnosis not present

## 2019-02-23 DIAGNOSIS — L0390 Cellulitis, unspecified: Secondary | ICD-10-CM | POA: Diagnosis not present

## 2019-02-23 DIAGNOSIS — L309 Dermatitis, unspecified: Secondary | ICD-10-CM | POA: Diagnosis not present

## 2019-03-02 DIAGNOSIS — R609 Edema, unspecified: Secondary | ICD-10-CM | POA: Diagnosis not present

## 2019-03-02 DIAGNOSIS — I2510 Atherosclerotic heart disease of native coronary artery without angina pectoris: Secondary | ICD-10-CM | POA: Diagnosis not present

## 2019-03-02 DIAGNOSIS — L309 Dermatitis, unspecified: Secondary | ICD-10-CM | POA: Diagnosis not present

## 2019-03-02 DIAGNOSIS — L0390 Cellulitis, unspecified: Secondary | ICD-10-CM | POA: Diagnosis not present

## 2019-03-02 DIAGNOSIS — M159 Polyosteoarthritis, unspecified: Secondary | ICD-10-CM | POA: Diagnosis not present

## 2019-03-09 DIAGNOSIS — M25512 Pain in left shoulder: Secondary | ICD-10-CM | POA: Diagnosis not present

## 2019-03-09 DIAGNOSIS — L0390 Cellulitis, unspecified: Secondary | ICD-10-CM | POA: Diagnosis not present

## 2019-03-09 DIAGNOSIS — M159 Polyosteoarthritis, unspecified: Secondary | ICD-10-CM | POA: Diagnosis not present

## 2019-03-09 DIAGNOSIS — L309 Dermatitis, unspecified: Secondary | ICD-10-CM | POA: Diagnosis not present

## 2019-03-09 DIAGNOSIS — I2510 Atherosclerotic heart disease of native coronary artery without angina pectoris: Secondary | ICD-10-CM | POA: Diagnosis not present

## 2019-03-10 ENCOUNTER — Other Ambulatory Visit: Payer: Self-pay

## 2019-03-10 ENCOUNTER — Encounter: Payer: Self-pay | Admitting: Cardiology

## 2019-03-10 ENCOUNTER — Ambulatory Visit (INDEPENDENT_AMBULATORY_CARE_PROVIDER_SITE_OTHER): Payer: Medicare Other | Admitting: Cardiology

## 2019-03-10 VITALS — BP 126/70 | HR 88 | Ht 70.0 in | Wt 280.0 lb

## 2019-03-10 DIAGNOSIS — Z86711 Personal history of pulmonary embolism: Secondary | ICD-10-CM | POA: Diagnosis not present

## 2019-03-10 DIAGNOSIS — I10 Essential (primary) hypertension: Secondary | ICD-10-CM | POA: Diagnosis not present

## 2019-03-10 DIAGNOSIS — G4733 Obstructive sleep apnea (adult) (pediatric): Secondary | ICD-10-CM | POA: Diagnosis not present

## 2019-03-10 DIAGNOSIS — Z6841 Body Mass Index (BMI) 40.0 and over, adult: Secondary | ICD-10-CM

## 2019-03-10 MED ORDER — HYDROCHLOROTHIAZIDE 12.5 MG PO TABS: 25.0000 mg | ORAL_TABLET | Freq: Every day | ORAL | 3 refills | Status: DC

## 2019-03-10 NOTE — Patient Instructions (Signed)
Medication Instructions:  Your physician has recommended you make the following change in your medication:   Stop Metolazone and start Hydrochlorothiazide 12.5 mg daily.  *If you need a refill on your cardiac medications before your next appointment, please call your pharmacy*  Lab Work: You had a BMET today. If you have labs (blood work) drawn today and your tests are completely normal, you will receive your results only by: Marland Kitchen MyChart Message (if you have MyChart) OR . A paper copy in the mail If you have any lab test that is abnormal or we need to change your treatment, we will call you to review the results.  Testing/Procedures: NA  Follow-Up: At [facility], you and your health needs are our priority.  As part of our continuing mission to provide you with exceptional heart care, we have created designated Provider Care Teams.  These Care Teams include your primary Cardiologist (physician) and Advanced Practice Providers (APPs -  Physician Assistants and Nurse Practitioners) who all work together to provide you with the care you need, when you need it.  Your next appointment:   1 month(s)  The format for your next appointment:   In Person  Provider:   Jyl Heinz, MD  Other Instructions NA

## 2019-03-10 NOTE — Progress Notes (Signed)
Cardiology Office Note:    Date:  03/10/2019   ID:  Glenn Beck, DOB 1945/10/06, MRN [id number]  PCP:  Cher Nakai, MD  Cardiologist:  Jenean Lindau, MD   Referring MD: Cher Nakai, MD    ASSESSMENT:    1. Essential hypertension   2. OSA (obstructive sleep apnea)   3. Morbid obesity with BMI of 40.0-44.9, adult ([place])   4. History of pulmonary embolism   5. Morbid obesity ([place])    PLAN:    In order of problems listed above:  1. Primary prevention stressed with the patient.  Importance of compliance with diet and medication stressed and he vocalized understanding 2. Bilaterally pedal edema: Patient mentions to me that hydrochlorothiazide works better for him so I will stop metoprolol and start him back on hydrochlorothiazide.  He will have a Chem-7 today.  He will be seen for follow-up appointment in a month.  He is on Eliquis and does not miss his medications.  He takes regularly. 3. History of pulmonary embolism: Patient is very regular with his anticoagulation and does not miss it. 4. Obstructive sleep apnea: Sleep health issues were discussed 5. Morbid obesity: Diet was discussed risks of obesity explained and he vocalized understanding.   Medication Adjustments/Labs and Tests Ordered: Current medicines are reviewed at length with the patient today.  Concerns regarding medicines are outlined above.  No orders of the defined types were placed in this encounter.  No orders of the defined types were placed in this encounter.    Chief Complaint  Patient presents with  . Follow-up     History of Present Illness:    Glenn Beck is a 74 y.o. male.  Patient has past medical history of essential hypertension sleep apnea morbid obesity and history of pulmonary embolism.  He denies any problems at this time and takes care of activities of daily living.  No chest pain orthopnea or PND.  His chronic complaint is of bilateral pedal edema.  At the time of my evaluation,  the patient is alert awake oriented and in no distress.  Past Medical History:  Diagnosis Date  . Anxiety    takes Xanax daily as needed  . Arthritis    "all over my body"  . Asthma    bronchial  . Chronic back pain   . Complication of anesthesia    pt. reports swallowing has been difficult since having ACDF- 2016, has been eval. by ENT & testing done at [facility].   . Diverticulosis    takes Bentyl daily as needed  . GERD (gastroesophageal reflux disease)    takes Nexium daily as needed  . Glaucoma    uses eye drops  . Gout    on study medications for this  . Headache    r/t neck issues  . History of colon polyps    benign  . History of shingles   . Hypertension    takes Lisinopril and Metoprolol daily  . Hypothyroidism    takes Synthroid daily  . Insomnia    takes Triazolam nightly as needed  . Joint pain   . Kidney infection    beint treated at present with Amoxicillin   . Myocardial infarction ([place]) >62yrs ago  . Pneumonia 69yrs ago  . Shortness of breath dyspnea    with exertion  . Swallowing difficulty    has been evaluated at [facility]. for this problem since Cervical Fusion    Past Surgical History:  Procedure Laterality Date  . ANTERIOR CERVICAL DECOMP/DISCECTOMY FUSION N/A 04/19/2014   Procedure: ANTERIOR CERVICAL DECOMPRESSION/DISCECTOMY FUSION CERVICAL THREE-FOUR CERVICAL FOUR-FIVE ,CERVICAL FIVE-SIX WITH INTERBODY PROTHESIS PLATING BONEGRAFT;  Surgeon: Consuella Lose, MD;  Location: [place] [facility];  Service: Neurosurgery;  Laterality: N/A;  . BACK SURGERY     x 2;fusion  . CARDIAC CATHETERIZATION  >79yrs ago   /w angioplasty  . COLONOSCOPY    . ESOPHAGOGASTRODUODENOSCOPY    . EYE SURGERY Bilateral   . HERNIA REPAIR Right    many yrs. ago   . KNEE ARTHROSCOPY Right   . TONSILLECTOMY    . TOTAL KNEE ARTHROPLASTY Right 12/26/2014   Procedure: TOTAL KNEE ARTHROPLASTY;  Surgeon: Vickey Huger, MD;  Location: [facility];  Service: Orthopedics;   Laterality: Right;    Current Medications: Current Meds  Medication Sig  . albuterol (PROVENTIL HFA;VENTOLIN HFA) 108 (90 BASE) MCG/ACT inhaler Inhale 2 puffs into the lungs every 6 (six) hours as needed for wheezing or shortness of breath.  Marland Kitchen apixaban (ELIQUIS) 5 MG TABS tablet Take 1 tablet (5 mg total) by mouth 2 (two) times daily.  Marland Kitchen esomeprazole (NEXIUM) 20 MG capsule Take 20 mg by mouth daily as needed (acid reflux).   . feeding supplement, ENSURE ENLIVE, (ENSURE ENLIVE) LIQD Take 237 mLs by mouth 2 (two) times daily between meals.  Marland Kitchen HYDROcodone-acetaminophen (NORCO) 10-325 MG tablet Take 1 tablet by mouth 3 (three) times daily as needed (pain). for pain  . ipratropium-albuterol (DUONEB) 0.5-2.5 (3) MG/3ML SOLN Take 3 mLs by nebulization 4 (four) times daily as needed (shortness of breath/wheezing).   Marland Kitchen levothyroxine (SYNTHROID, LEVOTHROID) 200 MCG tablet Take 200 mcg by mouth daily before breakfast.  . metolazone (ZAROXOLYN) 2.5 MG tablet Take 1 tablet (2.5 mg total) by mouth daily.  . metoprolol succinate (TOPROL-XL) 50 MG 24 hr tablet Take 1 tablet (50 mg total) by mouth daily.  . tamsulosin (FLOMAX) 0.4 MG CAPS capsule Take 0.4 mg by mouth daily as needed (infrequent urination).   . triazolam (HALCION) 0.25 MG tablet Take 0.125 mg by mouth at bedtime.      Allergies:   Ciprofloxacin, Nasacort [triamcinolone], Novocain [procaine], and Other   Social History   Socioeconomic History  . Marital status: Widowed    Spouse name: Not on file  . Number of children: Not on file  . Years of education: Not on file  . Highest education level: Not on file  Occupational History  . Occupation: Regulatory affairs officer  Tobacco Use  . Smoking status: Former Smoker    Packs/day: 1.50    Years: 40.00    Pack years: 60.00    Types: Cigarettes    Start date: 1961    Quit date: 2001    Years since quitting: 20.1  . Smokeless tobacco: Former Systems developer  . Tobacco comment: quit smoking in 2001    Substance and Sexual Activity  . Alcohol use: No  . Drug use: No  . Sexual activity: Not Currently  Other Topics Concern  . Not on file  Social History Narrative   No social documentation.   Social Determinants of Health   Financial Resource Strain:   . Difficulty of Paying Living Expenses: Not on file  Food Insecurity:   . Worried About Charity fundraiser in the Last Year: Not on file  . Ran Out of Food in the Last Year: Not on file  Transportation Needs:   . Lack of Transportation (Medical): Not on file  .  Lack of Transportation (Non-Medical): Not on file  Physical Activity:   . Days of Exercise per Week: Not on file  . Minutes of Exercise per Session: Not on file  Stress:   . Feeling of Stress : Not on file  Social Connections:   . Frequency of Communication with Friends and Family: Not on file  . Frequency of Social Gatherings with Friends and Family: Not on file  . Attends Religious Services: Not on file  . Active Member of Clubs or Organizations: Not on file  . Attends Archivist Meetings: Not on file  . Marital Status: Not on file     Family History: The patient's family history is not on file.  ROS:   Please see the history of present illness.    All other systems reviewed and are negative.  EKGs/Labs/Other Studies Reviewed:    The following studies were reviewed today: Labs were reviewed   Recent Labs: 07/13/2018: ALT 17; Hemoglobin 15.3; Platelets 272 11/24/2018: BUN 11; Creatinine, Ser 0.82; Potassium 3.9; Sodium 138; TSH 0.029  Recent Lipid Panel    Component Value Date/Time   CHOL 130 07/13/2018 1002   TRIG 126 07/13/2018 1002   HDL 35 (L) 07/13/2018 1002   CHOLHDL 3.7 07/13/2018 1002   LDLCALC 70 07/13/2018 1002    Physical Exam:    VS:  BP 126/70   Pulse 88   Ht 5\' 10"  (1.778 m)   Wt 280 lb (127 kg)   SpO2 91%   BMI 40.18 kg/m     Wt Readings from Last 3 Encounters:  03/10/19 280 lb (127 kg)  11/24/18 275 lb (124.7 kg)   07/09/18 275 lb (124.7 kg)     GEN: Patient is in no acute distress HEENT: Normal NECK: No JVD; No carotid bruits LYMPHATICS: No lymphadenopathy CARDIAC: Hear sounds regular, 2/6 systolic murmur at the apex. RESPIRATORY:  Clear to auscultation without rales, wheezing or rhonchi  ABDOMEN: Soft, non-tender, non-distended MUSCULOSKELETAL:  No edema; No deformity  SKIN: Warm and dry NEUROLOGIC:  Alert and oriented x 3 PSYCHIATRIC:  Normal affect   Signed, Jenean Lindau, MD  03/10/2019 11:51 AM    [place]

## 2019-03-11 DIAGNOSIS — R0902 Hypoxemia: Secondary | ICD-10-CM | POA: Diagnosis not present

## 2019-03-11 DIAGNOSIS — J449 Chronic obstructive pulmonary disease, unspecified: Secondary | ICD-10-CM | POA: Diagnosis not present

## 2019-03-11 LAB — BASIC METABOLIC PANEL
BUN/Creatinine Ratio: 14 (ref 10–24)
BUN: 12 mg/dL (ref 8–27)
CO2: 25 mmol/L (ref 20–29)
Calcium: 9.5 mg/dL (ref 8.6–10.2)
Chloride: 97 mmol/L (ref 96–106)
Creatinine, Ser: 0.86 mg/dL (ref 0.76–1.27)
GFR calc Af Amer: 98 mL/min/{1.73_m2} (ref >59)
GFR calc non Af Amer: 85 mL/min/{1.73_m2} (ref >59)
Glucose: 108 mg/dL — ABNORMAL HIGH (ref 65–99)
Potassium: 3.8 mmol/L (ref 3.5–5.2)
Sodium: 140 mmol/L (ref 134–144)

## 2019-03-29 DIAGNOSIS — I10 Essential (primary) hypertension: Secondary | ICD-10-CM | POA: Diagnosis not present

## 2019-03-29 DIAGNOSIS — E559 Vitamin D deficiency, unspecified: Secondary | ICD-10-CM | POA: Diagnosis not present

## 2019-03-29 DIAGNOSIS — E785 Hyperlipidemia, unspecified: Secondary | ICD-10-CM | POA: Diagnosis not present

## 2019-03-29 DIAGNOSIS — R609 Edema, unspecified: Secondary | ICD-10-CM | POA: Diagnosis not present

## 2019-03-29 DIAGNOSIS — I2510 Atherosclerotic heart disease of native coronary artery without angina pectoris: Secondary | ICD-10-CM | POA: Diagnosis not present

## 2019-03-29 DIAGNOSIS — L309 Dermatitis, unspecified: Secondary | ICD-10-CM | POA: Diagnosis not present

## 2019-03-29 DIAGNOSIS — L0390 Cellulitis, unspecified: Secondary | ICD-10-CM | POA: Diagnosis not present

## 2019-04-07 ENCOUNTER — Ambulatory Visit: Payer: Medicare Other | Admitting: Cardiology

## 2019-04-07 DIAGNOSIS — H04123 Dry eye syndrome of bilateral lacrimal glands: Secondary | ICD-10-CM | POA: Diagnosis not present

## 2019-04-07 DIAGNOSIS — H40003 Preglaucoma, unspecified, bilateral: Secondary | ICD-10-CM | POA: Diagnosis not present

## 2019-04-07 DIAGNOSIS — H25813 Combined forms of age-related cataract, bilateral: Secondary | ICD-10-CM | POA: Diagnosis not present

## 2019-04-08 DIAGNOSIS — R0902 Hypoxemia: Secondary | ICD-10-CM | POA: Diagnosis not present

## 2019-04-08 DIAGNOSIS — J449 Chronic obstructive pulmonary disease, unspecified: Secondary | ICD-10-CM | POA: Diagnosis not present

## 2019-04-12 DIAGNOSIS — I2510 Atherosclerotic heart disease of native coronary artery without angina pectoris: Secondary | ICD-10-CM | POA: Diagnosis not present

## 2019-04-12 DIAGNOSIS — R609 Edema, unspecified: Secondary | ICD-10-CM | POA: Diagnosis not present

## 2019-04-12 DIAGNOSIS — E559 Vitamin D deficiency, unspecified: Secondary | ICD-10-CM | POA: Diagnosis not present

## 2019-04-12 DIAGNOSIS — M25512 Pain in left shoulder: Secondary | ICD-10-CM | POA: Diagnosis not present

## 2019-04-12 DIAGNOSIS — L309 Dermatitis, unspecified: Secondary | ICD-10-CM | POA: Diagnosis not present

## 2019-05-04 ENCOUNTER — Other Ambulatory Visit: Payer: Self-pay

## 2019-05-04 ENCOUNTER — Ambulatory Visit: Payer: Medicare Other | Admitting: Cardiology

## 2019-05-04 VITALS — BP 118/70 | HR 73 | Temp 97.8°F | Ht 70.0 in | Wt 277.0 lb

## 2019-05-04 DIAGNOSIS — I10 Essential (primary) hypertension: Secondary | ICD-10-CM | POA: Diagnosis not present

## 2019-05-04 DIAGNOSIS — Z86711 Personal history of pulmonary embolism: Secondary | ICD-10-CM | POA: Diagnosis not present

## 2019-05-04 DIAGNOSIS — G4733 Obstructive sleep apnea (adult) (pediatric): Secondary | ICD-10-CM

## 2019-05-04 NOTE — Progress Notes (Signed)
Cardiology Office Note:    Date:  05/04/2019   ID:  Glenn Beck, DOB 1945-02-25, MRN [id number]  PCP:  Cher Nakai, MD  Cardiologist:  Jenean Lindau, MD   Referring MD: Cher Nakai, MD    ASSESSMENT:    1. Essential hypertension   2. OSA (obstructive sleep apnea)   3. Morbid obesity ([place])   4. History of pulmonary embolism    PLAN:    In order of problems listed above:  1. Primary prevention stressed with the patient.  Importance of compliance with diet medication stressed and he vocalized understanding. 2. Essential hypertension: Blood pressure is stable and diet was discussed 3. History of pulmonary embolism on anticoagulation: This is managed by his primary care physician.  Risks of anticoagulation explained and he understands 4. Morbid obesity: Diet was discussed risks of obesity explained and he understands.  He promises to do better to lose weight. 5. Patient considering neck surgery and I told him that if that is needed I will put him through a stress test to assess cardiovascular risk at the appropriate time and he vocalized understanding.Patient will be seen in follow-up appointment in 6 months or earlier if the patient has any concerns    Medication Adjustments/Labs and Tests Ordered: Current medicines are reviewed at length with the patient today.  Concerns regarding medicines are outlined above.  No orders of the defined types were placed in this encounter.  No orders of the defined types were placed in this encounter.    No chief complaint on file.    History of Present Illness:    Glenn Beck is a 74 y.o. male.  Patient has past medical history of essential hypertension, pulmonary embolism, morbid obesity.  He has history of essential hypertension.  He denies any problems at this time.  He leads a sedentary lifestyle because of orthopedic issues.  No chest pain orthopnea or PND.  At the time of my evaluation, the patient is alert awake oriented  and in no distress.  He mentions to me that he is contemplating cervical spine surgery.  Past Medical History:  Diagnosis Date  . Anxiety    takes Xanax daily as needed  . Arthritis    "all over my body"  . Asthma    bronchial  . Chronic back pain   . Complication of anesthesia    pt. reports swallowing has been difficult since having ACDF- 2016, has been eval. by ENT & testing done at [facility].   . CPAP use counseling   . Diverticulosis    takes Bentyl daily as needed  . Essential hypertension 11/14/2014  . GERD (gastroesophageal reflux disease)    takes Nexium daily as needed  . Glaucoma    uses eye drops  . Gout    on study medications for this  . Headache    r/t neck issues  . History of colon polyps    benign  . History of pulmonary embolism 08/15/2017  . History of shingles   . Hypertension    takes Lisinopril and Metoprolol daily  . Hypothyroidism    takes Synthroid daily  . Hypoxemia   . Insomnia    takes Triazolam nightly as needed  . Joint pain   . Kidney infection    beint treated at present with Amoxicillin   . Morbid obesity ([place]) 02/26/2018  . Morbid obesity with BMI of 40.0-44.9, adult ([place]) 11/14/2014  . Myocardial infarction ([place]) >61yrs ago  . OSA (  obstructive sleep apnea)   . Pneumonia 41yrs ago  . Primary osteoarthritis of right knee 12/06/2014  . S/P total knee arthroplasty 12/26/2014  . S/P total knee replacement 12/26/2014  . Shortness of breath dyspnea    with exertion  . Swallowing difficulty    has been evaluated at [facility]. for this problem since Cervical Fusion    Past Surgical History:  Procedure Laterality Date  . ANTERIOR CERVICAL DECOMP/DISCECTOMY FUSION N/A 04/19/2014   Procedure: ANTERIOR CERVICAL DECOMPRESSION/DISCECTOMY FUSION CERVICAL THREE-FOUR CERVICAL FOUR-FIVE ,CERVICAL FIVE-SIX WITH INTERBODY PROTHESIS PLATING BONEGRAFT;  Surgeon: Consuella Lose, MD;  Location: [facility];  Service: Neurosurgery;  Laterality:  N/A;  . BACK SURGERY     x 2;fusion  . CARDIAC CATHETERIZATION  >3yrs ago   /w angioplasty  . COLONOSCOPY    . ESOPHAGOGASTRODUODENOSCOPY    . EYE SURGERY Bilateral   . HERNIA REPAIR Right    many yrs. ago   . KNEE ARTHROSCOPY Right   . TONSILLECTOMY    . TOTAL KNEE ARTHROPLASTY Right 12/26/2014   Procedure: TOTAL KNEE ARTHROPLASTY;  Surgeon: Vickey Huger, MD;  Location: [place];  Service: Orthopedics;  Laterality: Right;    Current Medications: Current Meds  Medication Sig  . albuterol (PROVENTIL HFA;VENTOLIN HFA) 108 (90 BASE) MCG/ACT inhaler Inhale 2 puffs into the lungs every 6 (six) hours as needed for wheezing or shortness of breath.  Marland Kitchen apixaban (ELIQUIS) 5 MG TABS tablet Take 1 tablet (5 mg total) by mouth 2 (two) times daily.  Marland Kitchen esomeprazole (NEXIUM) 20 MG capsule Take 20 mg by mouth daily as needed (acid reflux).   . feeding supplement, ENSURE ENLIVE, (ENSURE ENLIVE) LIQD Take 237 mLs by mouth 2 (two) times daily between meals.  Marland Kitchen HYDROcodone-acetaminophen (NORCO) 10-325 MG tablet Take 1 tablet by mouth 3 (three) times daily as needed (pain). for pain  . ipratropium-albuterol (DUONEB) 0.5-2.5 (3) MG/3ML SOLN Take 3 mLs by nebulization 4 (four) times daily as needed (shortness of breath/wheezing).   Marland Kitchen levothyroxine (SYNTHROID, LEVOTHROID) 200 MCG tablet Take 200 mcg by mouth daily before breakfast.  . metoprolol succinate (TOPROL-XL) 50 MG 24 hr tablet Take 1 tablet (50 mg total) by mouth daily.  . tamsulosin (FLOMAX) 0.4 MG CAPS capsule Take 0.4 mg by mouth daily as needed (infrequent urination).   . triazolam (HALCION) 0.25 MG tablet Take 0.125 mg by mouth at bedtime.      Allergies:   Ciprofloxacin, Nasacort [triamcinolone], Novocain [procaine], and Other   Social History   Socioeconomic History  . Marital status: Widowed    Spouse name: Not on file  . Number of children: Not on file  . Years of education: Not on file  . Highest education level: Not on file    Occupational History  . Occupation: Regulatory affairs officer  Tobacco Use  . Smoking status: Former Smoker    Packs/day: 1.50    Years: 40.00    Pack years: 60.00    Types: Cigarettes    Start date: 1961    Quit date: 2001    Years since quitting: 20.3  . Smokeless tobacco: Former Systems developer  . Tobacco comment: quit smoking in 2001  Substance and Sexual Activity  . Alcohol use: No  . Drug use: No  . Sexual activity: Not Currently  Other Topics Concern  . Not on file  Social History Narrative   No social documentation.   Social Determinants of Health   Financial Resource Strain:   . Difficulty of Paying  Living Expenses:   Food Insecurity:   . Worried About Charity fundraiser in the Last Year:   . Arboriculturist in the Last Year:   Transportation Needs:   . Film/video editor (Medical):   Marland Kitchen Lack of Transportation (Non-Medical):   Physical Activity:   . Days of Exercise per Week:   . Minutes of Exercise per Session:   Stress:   . Feeling of Stress :   Social Connections:   . Frequency of Communication with Friends and Family:   . Frequency of Social Gatherings with Friends and Family:   . Attends Religious Services:   . Active Member of Clubs or Organizations:   . Attends Archivist Meetings:   Marland Kitchen Marital Status:      Family History: The patient's family history is not on file.  ROS:   Please see the history of present illness.    All other systems reviewed and are negative.  EKGs/Labs/Other Studies Reviewed:    The following studies were reviewed today: Lab work from [employer] sheet was reviewed and his lipids were fine.   Recent Labs: 07/13/2018: ALT 17; Hemoglobin 15.3; Platelets 272 11/24/2018: TSH 0.029 03/10/2019: BUN 12; Creatinine, Ser 0.86; Potassium 3.8; Sodium 140  Recent Lipid Panel    Component Value Date/Time   CHOL 130 07/13/2018 1002   TRIG 126 07/13/2018 1002   HDL 35 (L) 07/13/2018 1002   CHOLHDL 3.7 07/13/2018 1002   LDLCALC 70  07/13/2018 1002    Physical Exam:    VS:  BP 118/70   Pulse 73   Temp 97.8 F (36.6 C)   Ht 5\' 10"  (1.778 m)   Wt 277 lb (125.6 kg)   SpO2 95%   BMI 39.75 kg/m     Wt Readings from Last 3 Encounters:  05/04/19 277 lb (125.6 kg)  03/10/19 280 lb (127 kg)  11/24/18 275 lb (124.7 kg)     GEN: Patient is in no acute distress HEENT: Normal NECK: No JVD; No carotid bruits LYMPHATICS: No lymphadenopathy CARDIAC: Hear sounds regular, 2/6 systolic murmur at the apex. RESPIRATORY:  Clear to auscultation without rales, wheezing or rhonchi  ABDOMEN: Soft, non-tender, non-distended MUSCULOSKELETAL:  No edema; No deformity  SKIN: Warm and dry NEUROLOGIC:  Alert and oriented x 3 PSYCHIATRIC:  Normal affect   Signed, Jenean Lindau, MD  05/04/2019 3:53 PM    [facility]

## 2019-05-04 NOTE — Patient Instructions (Signed)

## 2019-05-09 DIAGNOSIS — R0902 Hypoxemia: Secondary | ICD-10-CM | POA: Diagnosis not present

## 2019-05-09 DIAGNOSIS — J449 Chronic obstructive pulmonary disease, unspecified: Secondary | ICD-10-CM | POA: Diagnosis not present

## 2019-05-11 DIAGNOSIS — L309 Dermatitis, unspecified: Secondary | ICD-10-CM | POA: Diagnosis not present

## 2019-05-11 DIAGNOSIS — I2510 Atherosclerotic heart disease of native coronary artery without angina pectoris: Secondary | ICD-10-CM | POA: Diagnosis not present

## 2019-05-11 DIAGNOSIS — E559 Vitamin D deficiency, unspecified: Secondary | ICD-10-CM | POA: Diagnosis not present

## 2019-05-11 DIAGNOSIS — J449 Chronic obstructive pulmonary disease, unspecified: Secondary | ICD-10-CM | POA: Diagnosis not present

## 2019-05-11 DIAGNOSIS — R609 Edema, unspecified: Secondary | ICD-10-CM | POA: Diagnosis not present

## 2019-06-08 DIAGNOSIS — J449 Chronic obstructive pulmonary disease, unspecified: Secondary | ICD-10-CM | POA: Diagnosis not present

## 2019-06-08 DIAGNOSIS — R0902 Hypoxemia: Secondary | ICD-10-CM | POA: Diagnosis not present

## 2019-06-15 DIAGNOSIS — I2510 Atherosclerotic heart disease of native coronary artery without angina pectoris: Secondary | ICD-10-CM | POA: Diagnosis not present

## 2019-06-15 DIAGNOSIS — L309 Dermatitis, unspecified: Secondary | ICD-10-CM | POA: Diagnosis not present

## 2019-06-15 DIAGNOSIS — R609 Edema, unspecified: Secondary | ICD-10-CM | POA: Diagnosis not present

## 2019-06-15 DIAGNOSIS — E559 Vitamin D deficiency, unspecified: Secondary | ICD-10-CM | POA: Diagnosis not present

## 2019-06-15 DIAGNOSIS — I2699 Other pulmonary embolism without acute cor pulmonale: Secondary | ICD-10-CM | POA: Diagnosis not present

## 2019-07-02 DIAGNOSIS — E785 Hyperlipidemia, unspecified: Secondary | ICD-10-CM | POA: Diagnosis not present

## 2019-07-02 DIAGNOSIS — J208 Acute bronchitis due to other specified organisms: Secondary | ICD-10-CM | POA: Diagnosis not present

## 2019-07-02 DIAGNOSIS — I2510 Atherosclerotic heart disease of native coronary artery without angina pectoris: Secondary | ICD-10-CM | POA: Diagnosis not present

## 2019-07-02 DIAGNOSIS — R609 Edema, unspecified: Secondary | ICD-10-CM | POA: Diagnosis not present

## 2019-07-02 DIAGNOSIS — B9689 Other specified bacterial agents as the cause of diseases classified elsewhere: Secondary | ICD-10-CM | POA: Diagnosis not present

## 2019-07-02 DIAGNOSIS — L309 Dermatitis, unspecified: Secondary | ICD-10-CM | POA: Diagnosis not present

## 2019-07-02 DIAGNOSIS — E039 Hypothyroidism, unspecified: Secondary | ICD-10-CM | POA: Diagnosis not present

## 2019-07-02 DIAGNOSIS — I10 Essential (primary) hypertension: Secondary | ICD-10-CM | POA: Diagnosis not present

## 2019-07-09 DIAGNOSIS — J449 Chronic obstructive pulmonary disease, unspecified: Secondary | ICD-10-CM | POA: Diagnosis not present

## 2019-07-09 DIAGNOSIS — R0902 Hypoxemia: Secondary | ICD-10-CM | POA: Diagnosis not present

## 2019-07-23 DIAGNOSIS — I2510 Atherosclerotic heart disease of native coronary artery without angina pectoris: Secondary | ICD-10-CM | POA: Diagnosis not present

## 2019-07-23 DIAGNOSIS — L309 Dermatitis, unspecified: Secondary | ICD-10-CM | POA: Diagnosis not present

## 2019-07-23 DIAGNOSIS — R609 Edema, unspecified: Secondary | ICD-10-CM | POA: Diagnosis not present

## 2019-07-23 DIAGNOSIS — M159 Polyosteoarthritis, unspecified: Secondary | ICD-10-CM | POA: Diagnosis not present

## 2019-07-23 DIAGNOSIS — E559 Vitamin D deficiency, unspecified: Secondary | ICD-10-CM | POA: Diagnosis not present

## 2019-08-08 DIAGNOSIS — J449 Chronic obstructive pulmonary disease, unspecified: Secondary | ICD-10-CM | POA: Diagnosis not present

## 2019-08-08 DIAGNOSIS — R0902 Hypoxemia: Secondary | ICD-10-CM | POA: Diagnosis not present

## 2019-08-24 ENCOUNTER — Other Ambulatory Visit: Payer: Self-pay | Admitting: Cardiology

## 2019-08-24 DIAGNOSIS — L0390 Cellulitis, unspecified: Secondary | ICD-10-CM | POA: Diagnosis not present

## 2019-08-24 DIAGNOSIS — R609 Edema, unspecified: Secondary | ICD-10-CM | POA: Diagnosis not present

## 2019-08-24 DIAGNOSIS — I2510 Atherosclerotic heart disease of native coronary artery without angina pectoris: Secondary | ICD-10-CM | POA: Diagnosis not present

## 2019-08-24 DIAGNOSIS — L309 Dermatitis, unspecified: Secondary | ICD-10-CM | POA: Diagnosis not present

## 2019-08-24 DIAGNOSIS — M159 Polyosteoarthritis, unspecified: Secondary | ICD-10-CM | POA: Diagnosis not present

## 2019-08-31 DIAGNOSIS — I2510 Atherosclerotic heart disease of native coronary artery without angina pectoris: Secondary | ICD-10-CM | POA: Diagnosis not present

## 2019-08-31 DIAGNOSIS — L309 Dermatitis, unspecified: Secondary | ICD-10-CM | POA: Diagnosis not present

## 2019-08-31 DIAGNOSIS — R609 Edema, unspecified: Secondary | ICD-10-CM | POA: Diagnosis not present

## 2019-08-31 DIAGNOSIS — M159 Polyosteoarthritis, unspecified: Secondary | ICD-10-CM | POA: Diagnosis not present

## 2019-08-31 DIAGNOSIS — L0390 Cellulitis, unspecified: Secondary | ICD-10-CM | POA: Diagnosis not present

## 2019-09-08 DIAGNOSIS — B9689 Other specified bacterial agents as the cause of diseases classified elsewhere: Secondary | ICD-10-CM | POA: Diagnosis not present

## 2019-09-08 DIAGNOSIS — J208 Acute bronchitis due to other specified organisms: Secondary | ICD-10-CM | POA: Diagnosis not present

## 2019-09-08 DIAGNOSIS — R0902 Hypoxemia: Secondary | ICD-10-CM | POA: Diagnosis not present

## 2019-09-08 DIAGNOSIS — I2510 Atherosclerotic heart disease of native coronary artery without angina pectoris: Secondary | ICD-10-CM | POA: Diagnosis not present

## 2019-09-08 DIAGNOSIS — E559 Vitamin D deficiency, unspecified: Secondary | ICD-10-CM | POA: Diagnosis not present

## 2019-09-08 DIAGNOSIS — L309 Dermatitis, unspecified: Secondary | ICD-10-CM | POA: Diagnosis not present

## 2019-09-08 DIAGNOSIS — J449 Chronic obstructive pulmonary disease, unspecified: Secondary | ICD-10-CM | POA: Diagnosis not present

## 2019-09-14 DIAGNOSIS — R062 Wheezing: Secondary | ICD-10-CM | POA: Diagnosis not present

## 2019-09-14 DIAGNOSIS — E559 Vitamin D deficiency, unspecified: Secondary | ICD-10-CM | POA: Diagnosis not present

## 2019-09-14 DIAGNOSIS — J208 Acute bronchitis due to other specified organisms: Secondary | ICD-10-CM | POA: Diagnosis not present

## 2019-09-14 DIAGNOSIS — R05 Cough: Secondary | ICD-10-CM | POA: Diagnosis not present

## 2019-09-14 DIAGNOSIS — B9689 Other specified bacterial agents as the cause of diseases classified elsewhere: Secondary | ICD-10-CM | POA: Diagnosis not present

## 2019-09-14 DIAGNOSIS — L309 Dermatitis, unspecified: Secondary | ICD-10-CM | POA: Diagnosis not present

## 2019-09-14 DIAGNOSIS — J439 Emphysema, unspecified: Secondary | ICD-10-CM | POA: Diagnosis not present

## 2019-09-14 DIAGNOSIS — J209 Acute bronchitis, unspecified: Secondary | ICD-10-CM | POA: Diagnosis not present

## 2019-09-14 DIAGNOSIS — I2510 Atherosclerotic heart disease of native coronary artery without angina pectoris: Secondary | ICD-10-CM | POA: Diagnosis not present

## 2019-09-16 DIAGNOSIS — E559 Vitamin D deficiency, unspecified: Secondary | ICD-10-CM | POA: Diagnosis not present

## 2019-09-16 DIAGNOSIS — L309 Dermatitis, unspecified: Secondary | ICD-10-CM | POA: Diagnosis not present

## 2019-09-16 DIAGNOSIS — I2510 Atherosclerotic heart disease of native coronary artery without angina pectoris: Secondary | ICD-10-CM | POA: Diagnosis not present

## 2019-09-16 DIAGNOSIS — J208 Acute bronchitis due to other specified organisms: Secondary | ICD-10-CM | POA: Diagnosis not present

## 2019-09-16 DIAGNOSIS — B9689 Other specified bacterial agents as the cause of diseases classified elsewhere: Secondary | ICD-10-CM | POA: Diagnosis not present

## 2019-09-24 DIAGNOSIS — E559 Vitamin D deficiency, unspecified: Secondary | ICD-10-CM | POA: Diagnosis not present

## 2019-09-24 DIAGNOSIS — B9689 Other specified bacterial agents as the cause of diseases classified elsewhere: Secondary | ICD-10-CM | POA: Diagnosis not present

## 2019-09-24 DIAGNOSIS — J208 Acute bronchitis due to other specified organisms: Secondary | ICD-10-CM | POA: Diagnosis not present

## 2019-09-24 DIAGNOSIS — L309 Dermatitis, unspecified: Secondary | ICD-10-CM | POA: Diagnosis not present

## 2019-09-24 DIAGNOSIS — I2510 Atherosclerotic heart disease of native coronary artery without angina pectoris: Secondary | ICD-10-CM | POA: Diagnosis not present

## 2019-09-29 DIAGNOSIS — J208 Acute bronchitis due to other specified organisms: Secondary | ICD-10-CM | POA: Diagnosis not present

## 2019-09-29 DIAGNOSIS — L309 Dermatitis, unspecified: Secondary | ICD-10-CM | POA: Diagnosis not present

## 2019-09-29 DIAGNOSIS — B9689 Other specified bacterial agents as the cause of diseases classified elsewhere: Secondary | ICD-10-CM | POA: Diagnosis not present

## 2019-09-29 DIAGNOSIS — E559 Vitamin D deficiency, unspecified: Secondary | ICD-10-CM | POA: Diagnosis not present

## 2019-09-29 DIAGNOSIS — I2510 Atherosclerotic heart disease of native coronary artery without angina pectoris: Secondary | ICD-10-CM | POA: Diagnosis not present

## 2019-10-05 DIAGNOSIS — E559 Vitamin D deficiency, unspecified: Secondary | ICD-10-CM | POA: Diagnosis not present

## 2019-10-05 DIAGNOSIS — E538 Deficiency of other specified B group vitamins: Secondary | ICD-10-CM | POA: Diagnosis not present

## 2019-10-05 DIAGNOSIS — M159 Polyosteoarthritis, unspecified: Secondary | ICD-10-CM | POA: Diagnosis not present

## 2019-10-05 DIAGNOSIS — I2510 Atherosclerotic heart disease of native coronary artery without angina pectoris: Secondary | ICD-10-CM | POA: Diagnosis not present

## 2019-10-05 DIAGNOSIS — L309 Dermatitis, unspecified: Secondary | ICD-10-CM | POA: Diagnosis not present

## 2019-10-09 DIAGNOSIS — R0902 Hypoxemia: Secondary | ICD-10-CM | POA: Diagnosis not present

## 2019-10-09 DIAGNOSIS — J449 Chronic obstructive pulmonary disease, unspecified: Secondary | ICD-10-CM | POA: Diagnosis not present

## 2019-10-13 DIAGNOSIS — H25813 Combined forms of age-related cataract, bilateral: Secondary | ICD-10-CM | POA: Diagnosis not present

## 2019-10-13 DIAGNOSIS — H40013 Open angle with borderline findings, low risk, bilateral: Secondary | ICD-10-CM | POA: Diagnosis not present

## 2019-10-13 DIAGNOSIS — H04123 Dry eye syndrome of bilateral lacrimal glands: Secondary | ICD-10-CM | POA: Diagnosis not present

## 2019-11-01 DIAGNOSIS — H25811 Combined forms of age-related cataract, right eye: Secondary | ICD-10-CM | POA: Diagnosis not present

## 2019-11-08 DIAGNOSIS — J449 Chronic obstructive pulmonary disease, unspecified: Secondary | ICD-10-CM | POA: Diagnosis not present

## 2019-11-08 DIAGNOSIS — R0902 Hypoxemia: Secondary | ICD-10-CM | POA: Diagnosis not present

## 2019-11-10 DIAGNOSIS — J189 Pneumonia, unspecified organism: Secondary | ICD-10-CM | POA: Insufficient documentation

## 2019-11-10 DIAGNOSIS — H409 Unspecified glaucoma: Secondary | ICD-10-CM | POA: Insufficient documentation

## 2019-11-10 DIAGNOSIS — Z86010 Personal history of colonic polyps: Secondary | ICD-10-CM | POA: Insufficient documentation

## 2019-11-10 DIAGNOSIS — G8929 Other chronic pain: Secondary | ICD-10-CM | POA: Insufficient documentation

## 2019-11-10 DIAGNOSIS — G4700 Insomnia, unspecified: Secondary | ICD-10-CM | POA: Insufficient documentation

## 2019-11-10 DIAGNOSIS — N159 Renal tubulo-interstitial disease, unspecified: Secondary | ICD-10-CM | POA: Insufficient documentation

## 2019-11-10 DIAGNOSIS — F419 Anxiety disorder, unspecified: Secondary | ICD-10-CM | POA: Insufficient documentation

## 2019-11-10 DIAGNOSIS — J45909 Unspecified asthma, uncomplicated: Secondary | ICD-10-CM | POA: Insufficient documentation

## 2019-11-10 DIAGNOSIS — T8859XA Other complications of anesthesia, initial encounter: Secondary | ICD-10-CM | POA: Insufficient documentation

## 2019-11-10 DIAGNOSIS — Z8619 Personal history of other infectious and parasitic diseases: Secondary | ICD-10-CM | POA: Insufficient documentation

## 2019-11-10 DIAGNOSIS — I219 Acute myocardial infarction, unspecified: Secondary | ICD-10-CM | POA: Insufficient documentation

## 2019-11-10 DIAGNOSIS — K5790 Diverticulosis of intestine, part unspecified, without perforation or abscess without bleeding: Secondary | ICD-10-CM | POA: Insufficient documentation

## 2019-11-10 DIAGNOSIS — M2550 Pain in unspecified joint: Secondary | ICD-10-CM | POA: Insufficient documentation

## 2019-11-10 DIAGNOSIS — K219 Gastro-esophageal reflux disease without esophagitis: Secondary | ICD-10-CM | POA: Insufficient documentation

## 2019-11-10 DIAGNOSIS — R1310 Dysphagia, unspecified: Secondary | ICD-10-CM | POA: Insufficient documentation

## 2019-11-10 DIAGNOSIS — M1990 Unspecified osteoarthritis, unspecified site: Secondary | ICD-10-CM | POA: Insufficient documentation

## 2019-11-10 DIAGNOSIS — M109 Gout, unspecified: Secondary | ICD-10-CM | POA: Insufficient documentation

## 2019-11-10 DIAGNOSIS — I10 Essential (primary) hypertension: Secondary | ICD-10-CM | POA: Insufficient documentation

## 2019-11-10 DIAGNOSIS — R519 Headache, unspecified: Secondary | ICD-10-CM | POA: Insufficient documentation

## 2019-11-12 ENCOUNTER — Ambulatory Visit: Payer: Medicare Other | Admitting: Cardiology

## 2019-11-12 ENCOUNTER — Encounter: Payer: Self-pay | Admitting: Cardiology

## 2019-11-12 ENCOUNTER — Other Ambulatory Visit: Payer: Self-pay

## 2019-11-12 VITALS — BP 134/74 | HR 62 | Ht 70.0 in | Wt 251.2 lb

## 2019-11-12 DIAGNOSIS — Z01810 Encounter for preprocedural cardiovascular examination: Secondary | ICD-10-CM | POA: Diagnosis not present

## 2019-11-12 DIAGNOSIS — Z86711 Personal history of pulmonary embolism: Secondary | ICD-10-CM

## 2019-11-12 DIAGNOSIS — G4733 Obstructive sleep apnea (adult) (pediatric): Secondary | ICD-10-CM

## 2019-11-12 DIAGNOSIS — I4510 Unspecified right bundle-branch block: Secondary | ICD-10-CM

## 2019-11-12 DIAGNOSIS — I10 Essential (primary) hypertension: Secondary | ICD-10-CM

## 2019-11-12 NOTE — Patient Instructions (Signed)

## 2019-11-12 NOTE — Progress Notes (Signed)
Cardiology Office Note:    Date:  11/12/2019   ID:  Glenn Beck, DOB Feb 20, 1945, MRN [id number]  PCP:  Cher Nakai, MD  Cardiologist:  Jenean Lindau, MD   Referring MD: Cher Nakai, MD    ASSESSMENT:    1. Essential hypertension   2. OSA (obstructive sleep apnea)   3. Preoperative cardiovascular examination   4. History of pulmonary embolism   5. Right bundle branch block    PLAN:    In order of problems listed above:  1. Preoperative cardiovascular evaluation: Patient has risk factors for coronary artery disease but his effort tolerance is fair.  He walks 10 minutes without any issues.  Subsequently his effort is limited by shortness of breath due to morbid obesity and other issues and also because of orthopedic issues.  His effort tolerance has not changed.  He has had a stress test last year and it was unremarkable.  In view of this I think he is not at high risk for this low risk surgery which is a cataract surgery.  Please do not hesitate to call us if any questions about his cardiovascular management.  The patient has been advised to walk 10 minutes 2 or 3 times a day and he promises to do so. 2. Essential hypertension: Blood pressure stable and diet was emphasized 3. Obstructive sleep apnea: Sleep health issues were discussed 4. History of pulmonary embolism on anticoagulation: Followed by primary care physician. 5. Right bundle branch block: Stable and medical management. 6. Patient will be seen in follow-up appointment in 6 months or earlier if the patient has any concerns.  Patient had multiple questions which were answered to satisfaction.   Medication Adjustments/Labs and Tests Ordered: Current medicines are reviewed at length with the patient today.  Concerns regarding medicines are outlined above.  Orders Placed This Encounter  Procedures  . EKG 12-Lead   No orders of the defined types were placed in this encounter.    No chief complaint on file.     History of Present Illness:    Glenn Beck is a 74 y.o. male.  Patient has past medical history of pulmonary embolism on anticoagulation, essential hypertension obstructive sleep apnea and obesity.  He mentions to me that he is planning to undergo cataract surgery.  He denies any chest pain orthopnea or PND.  His chronic dyspnea on exertion.  He walks 10 minutes a day twice a day or so.  He and his effort is limited because of dyspnea which is chronic and longstanding and orthopedic issues.  At the time of my evaluation, the patient is alert awake oriented and in no distress.  Past Medical History:  Diagnosis Date  . Anxiety    takes Xanax daily as needed  . Arthritis    "all over my body"  . Asthma    bronchial  . Chronic back pain   . Complication of anesthesia    pt. reports swallowing has been difficult since having ACDF- 2016, has been eval. by ENT & testing done at [facility].   . CPAP use counseling   . Diverticulosis    takes Bentyl daily as needed  . Essential hypertension 11/14/2014  . GERD (gastroesophageal reflux disease)    takes Nexium daily as needed  . Glaucoma    uses eye drops  . Gout    on study medications for this  . Headache    r/t neck issues  . History of colon polyps  benign  . History of pulmonary embolism 08/15/2017  . History of shingles   . Hypertension    takes Lisinopril and Metoprolol daily  . Hypothyroidism    takes Synthroid daily  . Hypoxemia   . Insomnia    takes Triazolam nightly as needed  . Joint pain   . Kidney infection    beint treated at present with Amoxicillin   . Morbid obesity ([place]) 02/26/2018  . Morbid obesity with BMI of 40.0-44.9, adult ([place]) 11/14/2014  . Myocardial infarction ([place]) >2yrs ago  . OSA (obstructive sleep apnea)   . Pneumonia 27yrs ago  . Primary osteoarthritis of right knee 12/06/2014  . S/P total knee arthroplasty 12/26/2014  . S/P total knee replacement 12/26/2014  . Shortness of breath  dyspnea    with exertion  . Swallowing difficulty    has been evaluated at [facility]. for this problem since Cervical Fusion    Past Surgical History:  Procedure Laterality Date  . ANTERIOR CERVICAL DECOMP/DISCECTOMY FUSION N/A 04/19/2014   Procedure: ANTERIOR CERVICAL DECOMPRESSION/DISCECTOMY FUSION CERVICAL THREE-FOUR CERVICAL FOUR-FIVE ,CERVICAL FIVE-SIX WITH INTERBODY PROTHESIS PLATING BONEGRAFT;  Surgeon: Consuella Lose, MD;  Location: [facility];  Service: Neurosurgery;  Laterality: N/A;  . BACK SURGERY     x 2;fusion  . CARDIAC CATHETERIZATION  >60yrs ago   /w angioplasty  . COLONOSCOPY    . ESOPHAGOGASTRODUODENOSCOPY    . EYE SURGERY Bilateral   . HERNIA REPAIR Right    many yrs. ago   . KNEE ARTHROSCOPY Right   . TONSILLECTOMY    . TOTAL KNEE ARTHROPLASTY Right 12/26/2014   Procedure: TOTAL KNEE ARTHROPLASTY;  Surgeon: Vickey Huger, MD;  Location: [facility];  Service: Orthopedics;  Laterality: Right;    Current Medications: Current Meds  Medication Sig  . albuterol (PROVENTIL HFA;VENTOLIN HFA) 108 (90 BASE) MCG/ACT inhaler Inhale 2 puffs into the lungs every 6 (six) hours as needed for wheezing or shortness of breath.  Marland Kitchen apixaban (ELIQUIS) 5 MG TABS tablet Take 1 tablet (5 mg total) by mouth 2 (two) times daily.  Marland Kitchen augmented betamethasone dipropionate (DIPROLENE-AF) 0.05 % cream Apply 1 application topically 2 (two) times daily.  Marland Kitchen doxycycline (VIBRA-TABS) 100 MG tablet Take 100 mg by mouth 2 (two) times daily.  Marland Kitchen esomeprazole (NEXIUM) 20 MG capsule Take 20 mg by mouth daily as needed (acid reflux).   . feeding supplement, ENSURE ENLIVE, (ENSURE ENLIVE) LIQD Take 237 mLs by mouth 2 (two) times daily between meals.  . furosemide (LASIX) 40 MG tablet Take 40 mg by mouth 3 (three) times daily as needed.  Marland Kitchen HYDROcodone-acetaminophen (NORCO) 10-325 MG tablet Take 1 tablet by mouth 3 (three) times daily as needed (pain). for pain  . ipratropium-albuterol (DUONEB) 0.5-2.5 (3)  MG/3ML SOLN Take 3 mLs by nebulization 4 (four) times daily as needed (shortness of breath/wheezing).   Marland Kitchen levothyroxine (SYNTHROID, LEVOTHROID) 200 MCG tablet Take 200 mcg by mouth daily before breakfast.  . metoprolol succinate (TOPROL-XL) 50 MG 24 hr tablet Take 1 tablet (50 mg total) by mouth daily.  . predniSONE (DELTASONE) 10 MG tablet 6 tabs daily for 2 days, 4 tabs daily for 2 days, 2 tabs daily for 2 days, 1 tab daily for 2 days  . tamsulosin (FLOMAX) 0.4 MG CAPS capsule Take 0.4 mg by mouth daily as needed (infrequent urination).   . triazolam (HALCION) 0.25 MG tablet Take 0.125 mg by mouth at bedtime.      Allergies:   Ciprofloxacin, Nasacort [triamcinolone],  Novocain [procaine], and Other   Social History   Socioeconomic History  . Marital status: Widowed    Spouse name: Not on file  . Number of children: Not on file  . Years of education: Not on file  . Highest education level: Not on file  Occupational History  . Occupation: Regulatory affairs officer  Tobacco Use  . Smoking status: Former Smoker    Packs/day: 1.50    Years: 40.00    Pack years: 60.00    Types: Cigarettes    Start date: 1961    Quit date: 2001    Years since quitting: 20.8  . Smokeless tobacco: Former Systems developer  . Tobacco comment: quit smoking in 2001  Vaping Use  . Vaping Use: Never used  Substance and Sexual Activity  . Alcohol use: No  . Drug use: No  . Sexual activity: Not Currently  Other Topics Concern  . Not on file  Social History Narrative   No social documentation.   Social Determinants of Health   Financial Resource Strain:   . Difficulty of Paying Living Expenses: Not on file  Food Insecurity:   . Worried About Charity fundraiser in the Last Year: Not on file  . Ran Out of Food in the Last Year: Not on file  Transportation Needs:   . Lack of Transportation (Medical): Not on file  . Lack of Transportation (Non-Medical): Not on file  Physical Activity:   . Days of Exercise per Week: Not  on file  . Minutes of Exercise per Session: Not on file  Stress:   . Feeling of Stress : Not on file  Social Connections:   . Frequency of Communication with Friends and Family: Not on file  . Frequency of Social Gatherings with Friends and Family: Not on file  . Attends Religious Services: Not on file  . Active Member of Clubs or Organizations: Not on file  . Attends Archivist Meetings: Not on file  . Marital Status: Not on file     Family History: The patient's family history is not on file.  ROS:   Please see the history of present illness.    All other systems reviewed and are negative.  EKGs/Labs/Other Studies Reviewed:    The following studies were reviewed today: EKG reveals sinus rhythm and right bundle branch block   Recent Labs: 11/24/2018: TSH 0.029 03/10/2019: BUN 12; Creatinine, Ser 0.86; Potassium 3.8; Sodium 140  Recent Lipid Panel    Component Value Date/Time   CHOL 130 07/13/2018 1002   TRIG 126 07/13/2018 1002   HDL 35 (L) 07/13/2018 1002   CHOLHDL 3.7 07/13/2018 1002   LDLCALC 70 07/13/2018 1002    Physical Exam:    VS:  BP 134/74   Pulse 62   Ht 5\' 10"  (1.778 m)   Wt 251 lb 3.2 oz (113.9 kg)   SpO2 95%   BMI 36.04 kg/m     Wt Readings from Last 3 Encounters:  11/12/19 251 lb 3.2 oz (113.9 kg)  05/04/19 277 lb (125.6 kg)  03/10/19 280 lb (127 kg)     GEN: Patient is in no acute distress HEENT: Normal NECK: No JVD; No carotid bruits LYMPHATICS: No lymphadenopathy CARDIAC: Hear sounds regular, 2/6 systolic murmur at the apex. RESPIRATORY:  Clear to auscultation without rales, wheezing or rhonchi  ABDOMEN: Soft, non-tender, non-distended MUSCULOSKELETAL:  No edema; No deformity  SKIN: Warm and dry NEUROLOGIC:  Alert and oriented x 3 PSYCHIATRIC:  Normal  affect   Signed, Jenean Lindau, MD  11/12/2019 10:01 AM    [place]

## 2019-11-16 ENCOUNTER — Telehealth: Payer: Self-pay | Admitting: Cardiology

## 2019-11-16 NOTE — Telephone Encounter (Signed)
Patient is calling to see if Dr. Geraldo Pitter has seen in the medical clearance to Dr. Novella Rob office. Please call to discuss or confirm

## 2019-11-16 NOTE — Telephone Encounter (Signed)
Last note faxed to Gwinda Passe with [facility] in [place]. Pt aware.

## 2019-11-18 NOTE — Telephone Encounter (Signed)
Follow up   Burnett Corrente PA  from [facility] is calling and says they have not received a fax stating he is okay for surgery. Pt is scheduled on Monday   Fax [phone]  Please advise

## 2019-11-18 NOTE — Telephone Encounter (Signed)
I have now faxed it to this number.

## 2020-01-18 DIAGNOSIS — Z0000 Encounter for general adult medical examination without abnormal findings: Secondary | ICD-10-CM | POA: Diagnosis not present

## 2020-01-18 DIAGNOSIS — Z9181 History of falling: Secondary | ICD-10-CM | POA: Diagnosis not present

## 2020-01-18 DIAGNOSIS — E785 Hyperlipidemia, unspecified: Secondary | ICD-10-CM | POA: Diagnosis not present

## 2020-02-08 DIAGNOSIS — I2510 Atherosclerotic heart disease of native coronary artery without angina pectoris: Secondary | ICD-10-CM | POA: Diagnosis not present

## 2020-02-08 DIAGNOSIS — L309 Dermatitis, unspecified: Secondary | ICD-10-CM | POA: Diagnosis not present

## 2020-02-08 DIAGNOSIS — E559 Vitamin D deficiency, unspecified: Secondary | ICD-10-CM | POA: Diagnosis not present

## 2020-02-08 DIAGNOSIS — M109 Gout, unspecified: Secondary | ICD-10-CM | POA: Diagnosis not present

## 2020-02-08 DIAGNOSIS — R0902 Hypoxemia: Secondary | ICD-10-CM | POA: Diagnosis not present

## 2020-02-08 DIAGNOSIS — L0390 Cellulitis, unspecified: Secondary | ICD-10-CM | POA: Diagnosis not present

## 2020-02-08 DIAGNOSIS — E538 Deficiency of other specified B group vitamins: Secondary | ICD-10-CM | POA: Diagnosis not present

## 2020-02-08 DIAGNOSIS — G8929 Other chronic pain: Secondary | ICD-10-CM | POA: Diagnosis not present

## 2020-02-08 DIAGNOSIS — M542 Cervicalgia: Secondary | ICD-10-CM | POA: Diagnosis not present

## 2020-02-08 DIAGNOSIS — J449 Chronic obstructive pulmonary disease, unspecified: Secondary | ICD-10-CM | POA: Diagnosis not present

## 2020-02-08 DIAGNOSIS — K589 Irritable bowel syndrome without diarrhea: Secondary | ICD-10-CM | POA: Diagnosis not present

## 2020-02-08 DIAGNOSIS — Z86711 Personal history of pulmonary embolism: Secondary | ICD-10-CM | POA: Diagnosis not present

## 2020-02-15 DIAGNOSIS — K589 Irritable bowel syndrome without diarrhea: Secondary | ICD-10-CM | POA: Diagnosis not present

## 2020-02-15 DIAGNOSIS — I2510 Atherosclerotic heart disease of native coronary artery without angina pectoris: Secondary | ICD-10-CM | POA: Diagnosis not present

## 2020-02-15 DIAGNOSIS — E538 Deficiency of other specified B group vitamins: Secondary | ICD-10-CM | POA: Diagnosis not present

## 2020-02-15 DIAGNOSIS — Z86711 Personal history of pulmonary embolism: Secondary | ICD-10-CM | POA: Diagnosis not present

## 2020-02-15 DIAGNOSIS — E039 Hypothyroidism, unspecified: Secondary | ICD-10-CM | POA: Diagnosis not present

## 2020-02-15 DIAGNOSIS — L309 Dermatitis, unspecified: Secondary | ICD-10-CM | POA: Diagnosis not present

## 2020-02-15 DIAGNOSIS — E559 Vitamin D deficiency, unspecified: Secondary | ICD-10-CM | POA: Diagnosis not present

## 2020-02-15 DIAGNOSIS — L0390 Cellulitis, unspecified: Secondary | ICD-10-CM | POA: Diagnosis not present

## 2020-02-15 DIAGNOSIS — M109 Gout, unspecified: Secondary | ICD-10-CM | POA: Diagnosis not present

## 2020-02-15 DIAGNOSIS — G8929 Other chronic pain: Secondary | ICD-10-CM | POA: Diagnosis not present

## 2020-02-15 DIAGNOSIS — M542 Cervicalgia: Secondary | ICD-10-CM | POA: Diagnosis not present

## 2020-02-22 DIAGNOSIS — L0390 Cellulitis, unspecified: Secondary | ICD-10-CM | POA: Diagnosis not present

## 2020-02-22 DIAGNOSIS — G8929 Other chronic pain: Secondary | ICD-10-CM | POA: Diagnosis not present

## 2020-02-22 DIAGNOSIS — K589 Irritable bowel syndrome without diarrhea: Secondary | ICD-10-CM | POA: Diagnosis not present

## 2020-02-22 DIAGNOSIS — E538 Deficiency of other specified B group vitamins: Secondary | ICD-10-CM | POA: Diagnosis not present

## 2020-02-22 DIAGNOSIS — Z86711 Personal history of pulmonary embolism: Secondary | ICD-10-CM | POA: Diagnosis not present

## 2020-02-22 DIAGNOSIS — E559 Vitamin D deficiency, unspecified: Secondary | ICD-10-CM | POA: Diagnosis not present

## 2020-02-22 DIAGNOSIS — M542 Cervicalgia: Secondary | ICD-10-CM | POA: Diagnosis not present

## 2020-02-22 DIAGNOSIS — I2510 Atherosclerotic heart disease of native coronary artery without angina pectoris: Secondary | ICD-10-CM | POA: Diagnosis not present

## 2020-02-22 DIAGNOSIS — M109 Gout, unspecified: Secondary | ICD-10-CM | POA: Diagnosis not present

## 2020-02-22 DIAGNOSIS — K2100 Gastro-esophageal reflux disease with esophagitis, without bleeding: Secondary | ICD-10-CM | POA: Diagnosis not present

## 2020-03-10 DIAGNOSIS — G8929 Other chronic pain: Secondary | ICD-10-CM | POA: Diagnosis not present

## 2020-03-10 DIAGNOSIS — M542 Cervicalgia: Secondary | ICD-10-CM | POA: Diagnosis not present

## 2020-03-10 DIAGNOSIS — L0390 Cellulitis, unspecified: Secondary | ICD-10-CM | POA: Diagnosis not present

## 2020-03-10 DIAGNOSIS — K589 Irritable bowel syndrome without diarrhea: Secondary | ICD-10-CM | POA: Diagnosis not present

## 2020-03-10 DIAGNOSIS — Z86711 Personal history of pulmonary embolism: Secondary | ICD-10-CM | POA: Diagnosis not present

## 2020-03-10 DIAGNOSIS — E559 Vitamin D deficiency, unspecified: Secondary | ICD-10-CM | POA: Diagnosis not present

## 2020-03-10 DIAGNOSIS — J439 Emphysema, unspecified: Secondary | ICD-10-CM | POA: Diagnosis not present

## 2020-03-10 DIAGNOSIS — I2510 Atherosclerotic heart disease of native coronary artery without angina pectoris: Secondary | ICD-10-CM | POA: Diagnosis not present

## 2020-03-10 DIAGNOSIS — E538 Deficiency of other specified B group vitamins: Secondary | ICD-10-CM | POA: Diagnosis not present

## 2020-03-10 DIAGNOSIS — J449 Chronic obstructive pulmonary disease, unspecified: Secondary | ICD-10-CM | POA: Diagnosis not present

## 2020-03-10 DIAGNOSIS — M109 Gout, unspecified: Secondary | ICD-10-CM | POA: Diagnosis not present

## 2020-03-10 DIAGNOSIS — R0902 Hypoxemia: Secondary | ICD-10-CM | POA: Diagnosis not present

## 2020-03-28 DIAGNOSIS — E538 Deficiency of other specified B group vitamins: Secondary | ICD-10-CM | POA: Diagnosis not present

## 2020-03-28 DIAGNOSIS — M542 Cervicalgia: Secondary | ICD-10-CM | POA: Diagnosis not present

## 2020-03-28 DIAGNOSIS — E785 Hyperlipidemia, unspecified: Secondary | ICD-10-CM | POA: Diagnosis not present

## 2020-03-28 DIAGNOSIS — L309 Dermatitis, unspecified: Secondary | ICD-10-CM | POA: Diagnosis not present

## 2020-03-28 DIAGNOSIS — E559 Vitamin D deficiency, unspecified: Secondary | ICD-10-CM | POA: Diagnosis not present

## 2020-03-28 DIAGNOSIS — I2510 Atherosclerotic heart disease of native coronary artery without angina pectoris: Secondary | ICD-10-CM | POA: Diagnosis not present

## 2020-03-28 DIAGNOSIS — G8929 Other chronic pain: Secondary | ICD-10-CM | POA: Diagnosis not present

## 2020-03-28 DIAGNOSIS — Z86711 Personal history of pulmonary embolism: Secondary | ICD-10-CM | POA: Diagnosis not present

## 2020-03-28 DIAGNOSIS — I10 Essential (primary) hypertension: Secondary | ICD-10-CM | POA: Diagnosis not present

## 2020-03-28 DIAGNOSIS — M109 Gout, unspecified: Secondary | ICD-10-CM | POA: Diagnosis not present

## 2020-03-28 DIAGNOSIS — K589 Irritable bowel syndrome without diarrhea: Secondary | ICD-10-CM | POA: Diagnosis not present

## 2020-03-28 DIAGNOSIS — Z139 Encounter for screening, unspecified: Secondary | ICD-10-CM | POA: Diagnosis not present

## 2020-03-28 DIAGNOSIS — K2100 Gastro-esophageal reflux disease with esophagitis, without bleeding: Secondary | ICD-10-CM | POA: Diagnosis not present

## 2020-04-07 DIAGNOSIS — R0902 Hypoxemia: Secondary | ICD-10-CM | POA: Diagnosis not present

## 2020-04-07 DIAGNOSIS — J449 Chronic obstructive pulmonary disease, unspecified: Secondary | ICD-10-CM | POA: Diagnosis not present

## 2020-04-12 DIAGNOSIS — G8929 Other chronic pain: Secondary | ICD-10-CM | POA: Diagnosis not present

## 2020-04-12 DIAGNOSIS — L309 Dermatitis, unspecified: Secondary | ICD-10-CM | POA: Diagnosis not present

## 2020-04-12 DIAGNOSIS — K2100 Gastro-esophageal reflux disease with esophagitis, without bleeding: Secondary | ICD-10-CM | POA: Diagnosis not present

## 2020-04-12 DIAGNOSIS — M542 Cervicalgia: Secondary | ICD-10-CM | POA: Diagnosis not present

## 2020-04-12 DIAGNOSIS — I2510 Atherosclerotic heart disease of native coronary artery without angina pectoris: Secondary | ICD-10-CM | POA: Diagnosis not present

## 2020-04-12 DIAGNOSIS — K589 Irritable bowel syndrome without diarrhea: Secondary | ICD-10-CM | POA: Diagnosis not present

## 2020-04-12 DIAGNOSIS — M109 Gout, unspecified: Secondary | ICD-10-CM | POA: Diagnosis not present

## 2020-04-12 DIAGNOSIS — E559 Vitamin D deficiency, unspecified: Secondary | ICD-10-CM | POA: Diagnosis not present

## 2020-04-12 DIAGNOSIS — L0390 Cellulitis, unspecified: Secondary | ICD-10-CM | POA: Diagnosis not present

## 2020-04-12 DIAGNOSIS — E538 Deficiency of other specified B group vitamins: Secondary | ICD-10-CM | POA: Diagnosis not present

## 2020-04-19 DIAGNOSIS — M109 Gout, unspecified: Secondary | ICD-10-CM | POA: Diagnosis not present

## 2020-04-19 DIAGNOSIS — M542 Cervicalgia: Secondary | ICD-10-CM | POA: Diagnosis not present

## 2020-04-19 DIAGNOSIS — L0390 Cellulitis, unspecified: Secondary | ICD-10-CM | POA: Diagnosis not present

## 2020-04-19 DIAGNOSIS — E538 Deficiency of other specified B group vitamins: Secondary | ICD-10-CM | POA: Diagnosis not present

## 2020-04-19 DIAGNOSIS — G8929 Other chronic pain: Secondary | ICD-10-CM | POA: Diagnosis not present

## 2020-04-19 DIAGNOSIS — I2510 Atherosclerotic heart disease of native coronary artery without angina pectoris: Secondary | ICD-10-CM | POA: Diagnosis not present

## 2020-04-19 DIAGNOSIS — K2100 Gastro-esophageal reflux disease with esophagitis, without bleeding: Secondary | ICD-10-CM | POA: Diagnosis not present

## 2020-04-19 DIAGNOSIS — K589 Irritable bowel syndrome without diarrhea: Secondary | ICD-10-CM | POA: Diagnosis not present

## 2020-04-19 DIAGNOSIS — E559 Vitamin D deficiency, unspecified: Secondary | ICD-10-CM | POA: Diagnosis not present

## 2020-04-19 DIAGNOSIS — L309 Dermatitis, unspecified: Secondary | ICD-10-CM | POA: Diagnosis not present

## 2020-04-26 DIAGNOSIS — M109 Gout, unspecified: Secondary | ICD-10-CM | POA: Diagnosis not present

## 2020-04-26 DIAGNOSIS — G8929 Other chronic pain: Secondary | ICD-10-CM | POA: Diagnosis not present

## 2020-04-26 DIAGNOSIS — K589 Irritable bowel syndrome without diarrhea: Secondary | ICD-10-CM | POA: Diagnosis not present

## 2020-04-26 DIAGNOSIS — M542 Cervicalgia: Secondary | ICD-10-CM | POA: Diagnosis not present

## 2020-04-26 DIAGNOSIS — L0390 Cellulitis, unspecified: Secondary | ICD-10-CM | POA: Diagnosis not present

## 2020-04-26 DIAGNOSIS — K2100 Gastro-esophageal reflux disease with esophagitis, without bleeding: Secondary | ICD-10-CM | POA: Diagnosis not present

## 2020-04-26 DIAGNOSIS — E538 Deficiency of other specified B group vitamins: Secondary | ICD-10-CM | POA: Diagnosis not present

## 2020-04-26 DIAGNOSIS — E559 Vitamin D deficiency, unspecified: Secondary | ICD-10-CM | POA: Diagnosis not present

## 2020-04-26 DIAGNOSIS — I2510 Atherosclerotic heart disease of native coronary artery without angina pectoris: Secondary | ICD-10-CM | POA: Diagnosis not present

## 2020-04-26 DIAGNOSIS — L309 Dermatitis, unspecified: Secondary | ICD-10-CM | POA: Diagnosis not present

## 2020-05-06 DIAGNOSIS — L539 Erythematous condition, unspecified: Secondary | ICD-10-CM | POA: Diagnosis not present

## 2020-05-06 DIAGNOSIS — T370X5A Adverse effect of sulfonamides, initial encounter: Secondary | ICD-10-CM | POA: Diagnosis not present

## 2020-05-06 DIAGNOSIS — L299 Pruritus, unspecified: Secondary | ICD-10-CM | POA: Diagnosis not present

## 2020-05-06 DIAGNOSIS — T368X5A Adverse effect of other systemic antibiotics, initial encounter: Secondary | ICD-10-CM | POA: Diagnosis not present

## 2020-05-08 DIAGNOSIS — R0902 Hypoxemia: Secondary | ICD-10-CM | POA: Diagnosis not present

## 2020-05-08 DIAGNOSIS — J449 Chronic obstructive pulmonary disease, unspecified: Secondary | ICD-10-CM | POA: Diagnosis not present

## 2020-05-11 DIAGNOSIS — G8929 Other chronic pain: Secondary | ICD-10-CM | POA: Diagnosis not present

## 2020-05-11 DIAGNOSIS — K2100 Gastro-esophageal reflux disease with esophagitis, without bleeding: Secondary | ICD-10-CM | POA: Diagnosis not present

## 2020-05-11 DIAGNOSIS — E538 Deficiency of other specified B group vitamins: Secondary | ICD-10-CM | POA: Diagnosis not present

## 2020-05-11 DIAGNOSIS — L309 Dermatitis, unspecified: Secondary | ICD-10-CM | POA: Diagnosis not present

## 2020-05-11 DIAGNOSIS — K589 Irritable bowel syndrome without diarrhea: Secondary | ICD-10-CM | POA: Diagnosis not present

## 2020-05-11 DIAGNOSIS — M542 Cervicalgia: Secondary | ICD-10-CM | POA: Diagnosis not present

## 2020-05-11 DIAGNOSIS — R0600 Dyspnea, unspecified: Secondary | ICD-10-CM | POA: Diagnosis not present

## 2020-05-11 DIAGNOSIS — E559 Vitamin D deficiency, unspecified: Secondary | ICD-10-CM | POA: Diagnosis not present

## 2020-05-11 DIAGNOSIS — M109 Gout, unspecified: Secondary | ICD-10-CM | POA: Diagnosis not present

## 2020-05-11 DIAGNOSIS — I2510 Atherosclerotic heart disease of native coronary artery without angina pectoris: Secondary | ICD-10-CM | POA: Diagnosis not present

## 2020-05-19 DIAGNOSIS — M542 Cervicalgia: Secondary | ICD-10-CM | POA: Diagnosis not present

## 2020-05-19 DIAGNOSIS — G8929 Other chronic pain: Secondary | ICD-10-CM | POA: Diagnosis not present

## 2020-05-19 DIAGNOSIS — E559 Vitamin D deficiency, unspecified: Secondary | ICD-10-CM | POA: Diagnosis not present

## 2020-05-19 DIAGNOSIS — K589 Irritable bowel syndrome without diarrhea: Secondary | ICD-10-CM | POA: Diagnosis not present

## 2020-05-19 DIAGNOSIS — E538 Deficiency of other specified B group vitamins: Secondary | ICD-10-CM | POA: Diagnosis not present

## 2020-05-19 DIAGNOSIS — E039 Hypothyroidism, unspecified: Secondary | ICD-10-CM | POA: Diagnosis not present

## 2020-05-19 DIAGNOSIS — K2100 Gastro-esophageal reflux disease with esophagitis, without bleeding: Secondary | ICD-10-CM | POA: Diagnosis not present

## 2020-05-19 DIAGNOSIS — I2510 Atherosclerotic heart disease of native coronary artery without angina pectoris: Secondary | ICD-10-CM | POA: Diagnosis not present

## 2020-05-19 DIAGNOSIS — M109 Gout, unspecified: Secondary | ICD-10-CM | POA: Diagnosis not present

## 2020-05-19 DIAGNOSIS — L309 Dermatitis, unspecified: Secondary | ICD-10-CM | POA: Diagnosis not present

## 2020-05-29 DIAGNOSIS — E538 Deficiency of other specified B group vitamins: Secondary | ICD-10-CM | POA: Diagnosis not present

## 2020-05-29 DIAGNOSIS — M542 Cervicalgia: Secondary | ICD-10-CM | POA: Diagnosis not present

## 2020-05-29 DIAGNOSIS — K2100 Gastro-esophageal reflux disease with esophagitis, without bleeding: Secondary | ICD-10-CM | POA: Diagnosis not present

## 2020-05-29 DIAGNOSIS — L0390 Cellulitis, unspecified: Secondary | ICD-10-CM | POA: Diagnosis not present

## 2020-05-29 DIAGNOSIS — M109 Gout, unspecified: Secondary | ICD-10-CM | POA: Diagnosis not present

## 2020-05-29 DIAGNOSIS — L309 Dermatitis, unspecified: Secondary | ICD-10-CM | POA: Diagnosis not present

## 2020-05-29 DIAGNOSIS — E559 Vitamin D deficiency, unspecified: Secondary | ICD-10-CM | POA: Diagnosis not present

## 2020-05-29 DIAGNOSIS — K589 Irritable bowel syndrome without diarrhea: Secondary | ICD-10-CM | POA: Diagnosis not present

## 2020-05-29 DIAGNOSIS — I2510 Atherosclerotic heart disease of native coronary artery without angina pectoris: Secondary | ICD-10-CM | POA: Diagnosis not present

## 2020-05-29 DIAGNOSIS — G8929 Other chronic pain: Secondary | ICD-10-CM | POA: Diagnosis not present

## 2020-06-05 DIAGNOSIS — M109 Gout, unspecified: Secondary | ICD-10-CM | POA: Diagnosis not present

## 2020-06-05 DIAGNOSIS — M542 Cervicalgia: Secondary | ICD-10-CM | POA: Diagnosis not present

## 2020-06-05 DIAGNOSIS — K589 Irritable bowel syndrome without diarrhea: Secondary | ICD-10-CM | POA: Diagnosis not present

## 2020-06-05 DIAGNOSIS — E559 Vitamin D deficiency, unspecified: Secondary | ICD-10-CM | POA: Diagnosis not present

## 2020-06-05 DIAGNOSIS — G8929 Other chronic pain: Secondary | ICD-10-CM | POA: Diagnosis not present

## 2020-06-05 DIAGNOSIS — E538 Deficiency of other specified B group vitamins: Secondary | ICD-10-CM | POA: Diagnosis not present

## 2020-06-05 DIAGNOSIS — K2100 Gastro-esophageal reflux disease with esophagitis, without bleeding: Secondary | ICD-10-CM | POA: Diagnosis not present

## 2020-06-05 DIAGNOSIS — L0390 Cellulitis, unspecified: Secondary | ICD-10-CM | POA: Diagnosis not present

## 2020-06-05 DIAGNOSIS — I2510 Atherosclerotic heart disease of native coronary artery without angina pectoris: Secondary | ICD-10-CM | POA: Diagnosis not present

## 2020-06-05 DIAGNOSIS — L309 Dermatitis, unspecified: Secondary | ICD-10-CM | POA: Diagnosis not present

## 2020-06-07 DIAGNOSIS — R0902 Hypoxemia: Secondary | ICD-10-CM | POA: Diagnosis not present

## 2020-06-07 DIAGNOSIS — J449 Chronic obstructive pulmonary disease, unspecified: Secondary | ICD-10-CM | POA: Diagnosis not present

## 2020-06-15 DIAGNOSIS — E559 Vitamin D deficiency, unspecified: Secondary | ICD-10-CM | POA: Diagnosis not present

## 2020-06-15 DIAGNOSIS — L309 Dermatitis, unspecified: Secondary | ICD-10-CM | POA: Diagnosis not present

## 2020-06-15 DIAGNOSIS — K589 Irritable bowel syndrome without diarrhea: Secondary | ICD-10-CM | POA: Diagnosis not present

## 2020-06-15 DIAGNOSIS — J208 Acute bronchitis due to other specified organisms: Secondary | ICD-10-CM | POA: Diagnosis not present

## 2020-06-15 DIAGNOSIS — M542 Cervicalgia: Secondary | ICD-10-CM | POA: Diagnosis not present

## 2020-06-15 DIAGNOSIS — M109 Gout, unspecified: Secondary | ICD-10-CM | POA: Diagnosis not present

## 2020-06-15 DIAGNOSIS — I2510 Atherosclerotic heart disease of native coronary artery without angina pectoris: Secondary | ICD-10-CM | POA: Diagnosis not present

## 2020-06-15 DIAGNOSIS — G8929 Other chronic pain: Secondary | ICD-10-CM | POA: Diagnosis not present

## 2020-06-15 DIAGNOSIS — B9689 Other specified bacterial agents as the cause of diseases classified elsewhere: Secondary | ICD-10-CM | POA: Diagnosis not present

## 2020-06-15 DIAGNOSIS — J439 Emphysema, unspecified: Secondary | ICD-10-CM | POA: Diagnosis not present

## 2020-06-15 DIAGNOSIS — E538 Deficiency of other specified B group vitamins: Secondary | ICD-10-CM | POA: Diagnosis not present

## 2020-06-22 DIAGNOSIS — I2510 Atherosclerotic heart disease of native coronary artery without angina pectoris: Secondary | ICD-10-CM | POA: Diagnosis not present

## 2020-06-22 DIAGNOSIS — K589 Irritable bowel syndrome without diarrhea: Secondary | ICD-10-CM | POA: Diagnosis not present

## 2020-06-22 DIAGNOSIS — B9689 Other specified bacterial agents as the cause of diseases classified elsewhere: Secondary | ICD-10-CM | POA: Diagnosis not present

## 2020-06-22 DIAGNOSIS — E785 Hyperlipidemia, unspecified: Secondary | ICD-10-CM | POA: Diagnosis not present

## 2020-06-22 DIAGNOSIS — E538 Deficiency of other specified B group vitamins: Secondary | ICD-10-CM | POA: Diagnosis not present

## 2020-06-22 DIAGNOSIS — G8929 Other chronic pain: Secondary | ICD-10-CM | POA: Diagnosis not present

## 2020-06-22 DIAGNOSIS — E559 Vitamin D deficiency, unspecified: Secondary | ICD-10-CM | POA: Diagnosis not present

## 2020-06-22 DIAGNOSIS — J208 Acute bronchitis due to other specified organisms: Secondary | ICD-10-CM | POA: Diagnosis not present

## 2020-06-22 DIAGNOSIS — L309 Dermatitis, unspecified: Secondary | ICD-10-CM | POA: Diagnosis not present

## 2020-06-22 DIAGNOSIS — M542 Cervicalgia: Secondary | ICD-10-CM | POA: Diagnosis not present

## 2020-06-22 DIAGNOSIS — I10 Essential (primary) hypertension: Secondary | ICD-10-CM | POA: Diagnosis not present

## 2020-06-22 DIAGNOSIS — J439 Emphysema, unspecified: Secondary | ICD-10-CM | POA: Diagnosis not present

## 2020-06-22 DIAGNOSIS — M109 Gout, unspecified: Secondary | ICD-10-CM | POA: Diagnosis not present

## 2020-07-08 DIAGNOSIS — J449 Chronic obstructive pulmonary disease, unspecified: Secondary | ICD-10-CM | POA: Diagnosis not present

## 2020-07-08 DIAGNOSIS — R0902 Hypoxemia: Secondary | ICD-10-CM | POA: Diagnosis not present

## 2020-07-13 DIAGNOSIS — L309 Dermatitis, unspecified: Secondary | ICD-10-CM | POA: Diagnosis not present

## 2020-07-13 DIAGNOSIS — M109 Gout, unspecified: Secondary | ICD-10-CM | POA: Diagnosis not present

## 2020-07-13 DIAGNOSIS — K589 Irritable bowel syndrome without diarrhea: Secondary | ICD-10-CM | POA: Diagnosis not present

## 2020-07-13 DIAGNOSIS — J439 Emphysema, unspecified: Secondary | ICD-10-CM | POA: Diagnosis not present

## 2020-07-13 DIAGNOSIS — B9689 Other specified bacterial agents as the cause of diseases classified elsewhere: Secondary | ICD-10-CM | POA: Diagnosis not present

## 2020-07-13 DIAGNOSIS — E538 Deficiency of other specified B group vitamins: Secondary | ICD-10-CM | POA: Diagnosis not present

## 2020-07-13 DIAGNOSIS — E559 Vitamin D deficiency, unspecified: Secondary | ICD-10-CM | POA: Diagnosis not present

## 2020-07-13 DIAGNOSIS — J208 Acute bronchitis due to other specified organisms: Secondary | ICD-10-CM | POA: Diagnosis not present

## 2020-07-13 DIAGNOSIS — M542 Cervicalgia: Secondary | ICD-10-CM | POA: Diagnosis not present

## 2020-07-13 DIAGNOSIS — G8929 Other chronic pain: Secondary | ICD-10-CM | POA: Diagnosis not present

## 2020-07-13 DIAGNOSIS — I2510 Atherosclerotic heart disease of native coronary artery without angina pectoris: Secondary | ICD-10-CM | POA: Diagnosis not present

## 2020-07-31 DIAGNOSIS — L309 Dermatitis, unspecified: Secondary | ICD-10-CM | POA: Diagnosis not present

## 2020-07-31 DIAGNOSIS — I2510 Atherosclerotic heart disease of native coronary artery without angina pectoris: Secondary | ICD-10-CM | POA: Diagnosis not present

## 2020-07-31 DIAGNOSIS — M542 Cervicalgia: Secondary | ICD-10-CM | POA: Diagnosis not present

## 2020-07-31 DIAGNOSIS — K2100 Gastro-esophageal reflux disease with esophagitis, without bleeding: Secondary | ICD-10-CM | POA: Diagnosis not present

## 2020-07-31 DIAGNOSIS — E538 Deficiency of other specified B group vitamins: Secondary | ICD-10-CM | POA: Diagnosis not present

## 2020-07-31 DIAGNOSIS — J439 Emphysema, unspecified: Secondary | ICD-10-CM | POA: Diagnosis not present

## 2020-07-31 DIAGNOSIS — E559 Vitamin D deficiency, unspecified: Secondary | ICD-10-CM | POA: Diagnosis not present

## 2020-07-31 DIAGNOSIS — K589 Irritable bowel syndrome without diarrhea: Secondary | ICD-10-CM | POA: Diagnosis not present

## 2020-07-31 DIAGNOSIS — M109 Gout, unspecified: Secondary | ICD-10-CM | POA: Diagnosis not present

## 2020-07-31 DIAGNOSIS — G8929 Other chronic pain: Secondary | ICD-10-CM | POA: Diagnosis not present

## 2020-07-31 DIAGNOSIS — M25551 Pain in right hip: Secondary | ICD-10-CM | POA: Diagnosis not present

## 2020-08-07 DIAGNOSIS — R0902 Hypoxemia: Secondary | ICD-10-CM | POA: Diagnosis not present

## 2020-08-07 DIAGNOSIS — J449 Chronic obstructive pulmonary disease, unspecified: Secondary | ICD-10-CM | POA: Diagnosis not present

## 2020-08-14 DIAGNOSIS — G8929 Other chronic pain: Secondary | ICD-10-CM | POA: Diagnosis not present

## 2020-08-14 DIAGNOSIS — L309 Dermatitis, unspecified: Secondary | ICD-10-CM | POA: Diagnosis not present

## 2020-08-14 DIAGNOSIS — M109 Gout, unspecified: Secondary | ICD-10-CM | POA: Diagnosis not present

## 2020-08-14 DIAGNOSIS — K2100 Gastro-esophageal reflux disease with esophagitis, without bleeding: Secondary | ICD-10-CM | POA: Diagnosis not present

## 2020-08-14 DIAGNOSIS — J439 Emphysema, unspecified: Secondary | ICD-10-CM | POA: Diagnosis not present

## 2020-08-14 DIAGNOSIS — E559 Vitamin D deficiency, unspecified: Secondary | ICD-10-CM | POA: Diagnosis not present

## 2020-08-14 DIAGNOSIS — J069 Acute upper respiratory infection, unspecified: Secondary | ICD-10-CM | POA: Diagnosis not present

## 2020-08-14 DIAGNOSIS — E538 Deficiency of other specified B group vitamins: Secondary | ICD-10-CM | POA: Diagnosis not present

## 2020-08-14 DIAGNOSIS — K589 Irritable bowel syndrome without diarrhea: Secondary | ICD-10-CM | POA: Diagnosis not present

## 2020-08-14 DIAGNOSIS — M542 Cervicalgia: Secondary | ICD-10-CM | POA: Diagnosis not present

## 2020-08-14 DIAGNOSIS — I2510 Atherosclerotic heart disease of native coronary artery without angina pectoris: Secondary | ICD-10-CM | POA: Diagnosis not present

## 2020-08-28 DIAGNOSIS — E538 Deficiency of other specified B group vitamins: Secondary | ICD-10-CM | POA: Diagnosis not present

## 2020-08-28 DIAGNOSIS — I2510 Atherosclerotic heart disease of native coronary artery without angina pectoris: Secondary | ICD-10-CM | POA: Diagnosis not present

## 2020-08-28 DIAGNOSIS — K589 Irritable bowel syndrome without diarrhea: Secondary | ICD-10-CM | POA: Diagnosis not present

## 2020-08-28 DIAGNOSIS — J439 Emphysema, unspecified: Secondary | ICD-10-CM | POA: Diagnosis not present

## 2020-08-28 DIAGNOSIS — G8929 Other chronic pain: Secondary | ICD-10-CM | POA: Diagnosis not present

## 2020-08-28 DIAGNOSIS — L309 Dermatitis, unspecified: Secondary | ICD-10-CM | POA: Diagnosis not present

## 2020-08-28 DIAGNOSIS — K2100 Gastro-esophageal reflux disease with esophagitis, without bleeding: Secondary | ICD-10-CM | POA: Diagnosis not present

## 2020-08-28 DIAGNOSIS — L739 Follicular disorder, unspecified: Secondary | ICD-10-CM | POA: Diagnosis not present

## 2020-08-28 DIAGNOSIS — M542 Cervicalgia: Secondary | ICD-10-CM | POA: Diagnosis not present

## 2020-08-28 DIAGNOSIS — E559 Vitamin D deficiency, unspecified: Secondary | ICD-10-CM | POA: Diagnosis not present

## 2020-08-28 DIAGNOSIS — M109 Gout, unspecified: Secondary | ICD-10-CM | POA: Diagnosis not present

## 2020-09-05 DIAGNOSIS — G8929 Other chronic pain: Secondary | ICD-10-CM | POA: Diagnosis not present

## 2020-09-05 DIAGNOSIS — B9561 Methicillin susceptible Staphylococcus aureus infection as the cause of diseases classified elsewhere: Secondary | ICD-10-CM | POA: Diagnosis not present

## 2020-09-05 DIAGNOSIS — L304 Erythema intertrigo: Secondary | ICD-10-CM | POA: Diagnosis not present

## 2020-09-05 DIAGNOSIS — L739 Follicular disorder, unspecified: Secondary | ICD-10-CM | POA: Diagnosis not present

## 2020-09-05 DIAGNOSIS — J439 Emphysema, unspecified: Secondary | ICD-10-CM | POA: Diagnosis not present

## 2020-09-05 DIAGNOSIS — K2100 Gastro-esophageal reflux disease with esophagitis, without bleeding: Secondary | ICD-10-CM | POA: Diagnosis not present

## 2020-09-05 DIAGNOSIS — M109 Gout, unspecified: Secondary | ICD-10-CM | POA: Diagnosis not present

## 2020-09-05 DIAGNOSIS — L0100 Impetigo, unspecified: Secondary | ICD-10-CM | POA: Diagnosis not present

## 2020-09-05 DIAGNOSIS — L309 Dermatitis, unspecified: Secondary | ICD-10-CM | POA: Diagnosis not present

## 2020-09-05 DIAGNOSIS — E559 Vitamin D deficiency, unspecified: Secondary | ICD-10-CM | POA: Diagnosis not present

## 2020-09-05 DIAGNOSIS — M542 Cervicalgia: Secondary | ICD-10-CM | POA: Diagnosis not present

## 2020-09-05 DIAGNOSIS — E538 Deficiency of other specified B group vitamins: Secondary | ICD-10-CM | POA: Diagnosis not present

## 2020-09-05 DIAGNOSIS — B356 Tinea cruris: Secondary | ICD-10-CM | POA: Diagnosis not present

## 2020-09-05 DIAGNOSIS — B372 Candidiasis of skin and nail: Secondary | ICD-10-CM | POA: Diagnosis not present

## 2020-09-05 DIAGNOSIS — I2510 Atherosclerotic heart disease of native coronary artery without angina pectoris: Secondary | ICD-10-CM | POA: Diagnosis not present

## 2020-09-05 DIAGNOSIS — K589 Irritable bowel syndrome without diarrhea: Secondary | ICD-10-CM | POA: Diagnosis not present

## 2020-09-07 DIAGNOSIS — J449 Chronic obstructive pulmonary disease, unspecified: Secondary | ICD-10-CM | POA: Diagnosis not present

## 2020-09-07 DIAGNOSIS — R0902 Hypoxemia: Secondary | ICD-10-CM | POA: Diagnosis not present

## 2020-09-12 DIAGNOSIS — K2100 Gastro-esophageal reflux disease with esophagitis, without bleeding: Secondary | ICD-10-CM | POA: Diagnosis not present

## 2020-09-12 DIAGNOSIS — I2510 Atherosclerotic heart disease of native coronary artery without angina pectoris: Secondary | ICD-10-CM | POA: Diagnosis not present

## 2020-09-12 DIAGNOSIS — L739 Follicular disorder, unspecified: Secondary | ICD-10-CM | POA: Diagnosis not present

## 2020-09-12 DIAGNOSIS — M542 Cervicalgia: Secondary | ICD-10-CM | POA: Diagnosis not present

## 2020-09-12 DIAGNOSIS — E538 Deficiency of other specified B group vitamins: Secondary | ICD-10-CM | POA: Diagnosis not present

## 2020-09-12 DIAGNOSIS — G8929 Other chronic pain: Secondary | ICD-10-CM | POA: Diagnosis not present

## 2020-09-12 DIAGNOSIS — J439 Emphysema, unspecified: Secondary | ICD-10-CM | POA: Diagnosis not present

## 2020-09-12 DIAGNOSIS — M109 Gout, unspecified: Secondary | ICD-10-CM | POA: Diagnosis not present

## 2020-09-12 DIAGNOSIS — L309 Dermatitis, unspecified: Secondary | ICD-10-CM | POA: Diagnosis not present

## 2020-09-12 DIAGNOSIS — K589 Irritable bowel syndrome without diarrhea: Secondary | ICD-10-CM | POA: Diagnosis not present

## 2020-09-12 DIAGNOSIS — E559 Vitamin D deficiency, unspecified: Secondary | ICD-10-CM | POA: Diagnosis not present

## 2020-09-13 DIAGNOSIS — L299 Pruritus, unspecified: Secondary | ICD-10-CM | POA: Diagnosis not present

## 2020-09-13 DIAGNOSIS — L989 Disorder of the skin and subcutaneous tissue, unspecified: Secondary | ICD-10-CM | POA: Diagnosis not present

## 2020-09-13 DIAGNOSIS — D485 Neoplasm of uncertain behavior of skin: Secondary | ICD-10-CM | POA: Diagnosis not present

## 2020-09-13 DIAGNOSIS — L219 Seborrheic dermatitis, unspecified: Secondary | ICD-10-CM | POA: Diagnosis not present

## 2020-09-13 DIAGNOSIS — L02212 Cutaneous abscess of back [any part, except buttock]: Secondary | ICD-10-CM | POA: Diagnosis not present

## 2020-09-13 DIAGNOSIS — L853 Xerosis cutis: Secondary | ICD-10-CM | POA: Diagnosis not present

## 2020-09-19 DIAGNOSIS — L400 Psoriasis vulgaris: Secondary | ICD-10-CM | POA: Diagnosis not present

## 2020-09-19 DIAGNOSIS — L299 Pruritus, unspecified: Secondary | ICD-10-CM | POA: Diagnosis not present

## 2020-09-20 DIAGNOSIS — L739 Follicular disorder, unspecified: Secondary | ICD-10-CM | POA: Diagnosis not present

## 2020-09-20 DIAGNOSIS — J439 Emphysema, unspecified: Secondary | ICD-10-CM | POA: Diagnosis not present

## 2020-09-20 DIAGNOSIS — K589 Irritable bowel syndrome without diarrhea: Secondary | ICD-10-CM | POA: Diagnosis not present

## 2020-09-20 DIAGNOSIS — M542 Cervicalgia: Secondary | ICD-10-CM | POA: Diagnosis not present

## 2020-09-20 DIAGNOSIS — I2510 Atherosclerotic heart disease of native coronary artery without angina pectoris: Secondary | ICD-10-CM | POA: Diagnosis not present

## 2020-09-20 DIAGNOSIS — G8929 Other chronic pain: Secondary | ICD-10-CM | POA: Diagnosis not present

## 2020-09-20 DIAGNOSIS — L309 Dermatitis, unspecified: Secondary | ICD-10-CM | POA: Diagnosis not present

## 2020-09-20 DIAGNOSIS — E559 Vitamin D deficiency, unspecified: Secondary | ICD-10-CM | POA: Diagnosis not present

## 2020-09-20 DIAGNOSIS — E538 Deficiency of other specified B group vitamins: Secondary | ICD-10-CM | POA: Diagnosis not present

## 2020-09-20 DIAGNOSIS — M109 Gout, unspecified: Secondary | ICD-10-CM | POA: Diagnosis not present

## 2020-09-26 DIAGNOSIS — L400 Psoriasis vulgaris: Secondary | ICD-10-CM | POA: Diagnosis not present

## 2020-09-26 DIAGNOSIS — Z79899 Other long term (current) drug therapy: Secondary | ICD-10-CM | POA: Diagnosis not present

## 2020-09-26 DIAGNOSIS — R531 Weakness: Secondary | ICD-10-CM | POA: Diagnosis not present

## 2020-10-02 DIAGNOSIS — L270 Generalized skin eruption due to drugs and medicaments taken internally: Secondary | ICD-10-CM | POA: Diagnosis not present

## 2020-10-02 DIAGNOSIS — L0100 Impetigo, unspecified: Secondary | ICD-10-CM | POA: Diagnosis not present

## 2020-10-02 DIAGNOSIS — L02415 Cutaneous abscess of right lower limb: Secondary | ICD-10-CM | POA: Diagnosis not present

## 2020-10-08 DIAGNOSIS — R0902 Hypoxemia: Secondary | ICD-10-CM | POA: Diagnosis not present

## 2020-10-08 DIAGNOSIS — J449 Chronic obstructive pulmonary disease, unspecified: Secondary | ICD-10-CM | POA: Diagnosis not present

## 2020-10-10 DIAGNOSIS — L97921 Non-pressure chronic ulcer of unspecified part of left lower leg limited to breakdown of skin: Secondary | ICD-10-CM | POA: Diagnosis not present

## 2020-10-10 DIAGNOSIS — L0100 Impetigo, unspecified: Secondary | ICD-10-CM | POA: Diagnosis not present

## 2020-10-10 DIAGNOSIS — L97911 Non-pressure chronic ulcer of unspecified part of right lower leg limited to breakdown of skin: Secondary | ICD-10-CM | POA: Diagnosis not present

## 2020-10-10 DIAGNOSIS — I8310 Varicose veins of unspecified lower extremity with inflammation: Secondary | ICD-10-CM | POA: Diagnosis not present

## 2020-10-17 DIAGNOSIS — L0100 Impetigo, unspecified: Secondary | ICD-10-CM | POA: Diagnosis not present

## 2020-10-17 DIAGNOSIS — I8310 Varicose veins of unspecified lower extremity with inflammation: Secondary | ICD-10-CM | POA: Diagnosis not present

## 2020-10-20 DIAGNOSIS — J439 Emphysema, unspecified: Secondary | ICD-10-CM | POA: Diagnosis not present

## 2020-10-20 DIAGNOSIS — M159 Polyosteoarthritis, unspecified: Secondary | ICD-10-CM | POA: Diagnosis not present

## 2020-10-20 DIAGNOSIS — E559 Vitamin D deficiency, unspecified: Secondary | ICD-10-CM | POA: Diagnosis not present

## 2020-10-20 DIAGNOSIS — K2100 Gastro-esophageal reflux disease with esophagitis, without bleeding: Secondary | ICD-10-CM | POA: Diagnosis not present

## 2020-10-20 DIAGNOSIS — K589 Irritable bowel syndrome without diarrhea: Secondary | ICD-10-CM | POA: Diagnosis not present

## 2020-10-20 DIAGNOSIS — G8929 Other chronic pain: Secondary | ICD-10-CM | POA: Diagnosis not present

## 2020-10-20 DIAGNOSIS — I2510 Atherosclerotic heart disease of native coronary artery without angina pectoris: Secondary | ICD-10-CM | POA: Diagnosis not present

## 2020-10-20 DIAGNOSIS — M109 Gout, unspecified: Secondary | ICD-10-CM | POA: Diagnosis not present

## 2020-10-20 DIAGNOSIS — L309 Dermatitis, unspecified: Secondary | ICD-10-CM | POA: Diagnosis not present

## 2020-10-20 DIAGNOSIS — E538 Deficiency of other specified B group vitamins: Secondary | ICD-10-CM | POA: Diagnosis not present

## 2020-10-20 DIAGNOSIS — M542 Cervicalgia: Secondary | ICD-10-CM | POA: Diagnosis not present

## 2020-11-07 DIAGNOSIS — I8310 Varicose veins of unspecified lower extremity with inflammation: Secondary | ICD-10-CM | POA: Diagnosis not present

## 2020-11-07 DIAGNOSIS — J449 Chronic obstructive pulmonary disease, unspecified: Secondary | ICD-10-CM | POA: Diagnosis not present

## 2020-11-07 DIAGNOSIS — R0902 Hypoxemia: Secondary | ICD-10-CM | POA: Diagnosis not present

## 2020-11-14 DIAGNOSIS — L0100 Impetigo, unspecified: Secondary | ICD-10-CM | POA: Diagnosis not present

## 2020-11-14 DIAGNOSIS — Z79899 Other long term (current) drug therapy: Secondary | ICD-10-CM | POA: Diagnosis not present

## 2020-11-21 DIAGNOSIS — L0100 Impetigo, unspecified: Secondary | ICD-10-CM | POA: Diagnosis not present

## 2020-11-21 DIAGNOSIS — L304 Erythema intertrigo: Secondary | ICD-10-CM | POA: Diagnosis not present

## 2020-11-23 DIAGNOSIS — J439 Emphysema, unspecified: Secondary | ICD-10-CM | POA: Diagnosis not present

## 2020-11-23 DIAGNOSIS — E785 Hyperlipidemia, unspecified: Secondary | ICD-10-CM | POA: Diagnosis not present

## 2020-11-23 DIAGNOSIS — M109 Gout, unspecified: Secondary | ICD-10-CM | POA: Diagnosis not present

## 2020-11-23 DIAGNOSIS — M159 Polyosteoarthritis, unspecified: Secondary | ICD-10-CM | POA: Diagnosis not present

## 2020-11-23 DIAGNOSIS — I2510 Atherosclerotic heart disease of native coronary artery without angina pectoris: Secondary | ICD-10-CM | POA: Diagnosis not present

## 2020-11-23 DIAGNOSIS — I10 Essential (primary) hypertension: Secondary | ICD-10-CM | POA: Diagnosis not present

## 2020-11-23 DIAGNOSIS — G8929 Other chronic pain: Secondary | ICD-10-CM | POA: Diagnosis not present

## 2020-11-23 DIAGNOSIS — J209 Acute bronchitis, unspecified: Secondary | ICD-10-CM | POA: Diagnosis not present

## 2020-11-23 DIAGNOSIS — E559 Vitamin D deficiency, unspecified: Secondary | ICD-10-CM | POA: Diagnosis not present

## 2020-11-23 DIAGNOSIS — E039 Hypothyroidism, unspecified: Secondary | ICD-10-CM | POA: Diagnosis not present

## 2020-11-23 DIAGNOSIS — L309 Dermatitis, unspecified: Secondary | ICD-10-CM | POA: Diagnosis not present

## 2020-11-23 DIAGNOSIS — K589 Irritable bowel syndrome without diarrhea: Secondary | ICD-10-CM | POA: Diagnosis not present

## 2020-11-23 DIAGNOSIS — E538 Deficiency of other specified B group vitamins: Secondary | ICD-10-CM | POA: Diagnosis not present

## 2020-11-23 DIAGNOSIS — M542 Cervicalgia: Secondary | ICD-10-CM | POA: Diagnosis not present

## 2020-12-08 DIAGNOSIS — R0902 Hypoxemia: Secondary | ICD-10-CM | POA: Diagnosis not present

## 2020-12-08 DIAGNOSIS — J449 Chronic obstructive pulmonary disease, unspecified: Secondary | ICD-10-CM | POA: Diagnosis not present

## 2020-12-12 DIAGNOSIS — L299 Pruritus, unspecified: Secondary | ICD-10-CM | POA: Diagnosis not present

## 2020-12-12 DIAGNOSIS — L209 Atopic dermatitis, unspecified: Secondary | ICD-10-CM | POA: Diagnosis not present

## 2020-12-12 DIAGNOSIS — L0100 Impetigo, unspecified: Secondary | ICD-10-CM | POA: Diagnosis not present

## 2020-12-21 DIAGNOSIS — E559 Vitamin D deficiency, unspecified: Secondary | ICD-10-CM | POA: Diagnosis not present

## 2020-12-21 DIAGNOSIS — I2510 Atherosclerotic heart disease of native coronary artery without angina pectoris: Secondary | ICD-10-CM | POA: Diagnosis not present

## 2020-12-21 DIAGNOSIS — J208 Acute bronchitis due to other specified organisms: Secondary | ICD-10-CM | POA: Diagnosis not present

## 2020-12-21 DIAGNOSIS — M545 Low back pain, unspecified: Secondary | ICD-10-CM | POA: Diagnosis not present

## 2020-12-21 DIAGNOSIS — K589 Irritable bowel syndrome without diarrhea: Secondary | ICD-10-CM | POA: Diagnosis not present

## 2020-12-21 DIAGNOSIS — B9689 Other specified bacterial agents as the cause of diseases classified elsewhere: Secondary | ICD-10-CM | POA: Diagnosis not present

## 2020-12-21 DIAGNOSIS — M159 Polyosteoarthritis, unspecified: Secondary | ICD-10-CM | POA: Diagnosis not present

## 2020-12-21 DIAGNOSIS — G8929 Other chronic pain: Secondary | ICD-10-CM | POA: Diagnosis not present

## 2020-12-21 DIAGNOSIS — J439 Emphysema, unspecified: Secondary | ICD-10-CM | POA: Diagnosis not present

## 2020-12-21 DIAGNOSIS — M542 Cervicalgia: Secondary | ICD-10-CM | POA: Diagnosis not present

## 2020-12-21 DIAGNOSIS — L309 Dermatitis, unspecified: Secondary | ICD-10-CM | POA: Diagnosis not present

## 2021-01-07 DIAGNOSIS — J449 Chronic obstructive pulmonary disease, unspecified: Secondary | ICD-10-CM | POA: Diagnosis not present

## 2021-01-07 DIAGNOSIS — R0902 Hypoxemia: Secondary | ICD-10-CM | POA: Diagnosis not present

## 2021-01-12 DIAGNOSIS — E538 Deficiency of other specified B group vitamins: Secondary | ICD-10-CM | POA: Diagnosis not present

## 2021-01-12 DIAGNOSIS — L0390 Cellulitis, unspecified: Secondary | ICD-10-CM | POA: Diagnosis not present

## 2021-01-12 DIAGNOSIS — M109 Gout, unspecified: Secondary | ICD-10-CM | POA: Diagnosis not present

## 2021-01-12 DIAGNOSIS — G8929 Other chronic pain: Secondary | ICD-10-CM | POA: Diagnosis not present

## 2021-01-12 DIAGNOSIS — I2510 Atherosclerotic heart disease of native coronary artery without angina pectoris: Secondary | ICD-10-CM | POA: Diagnosis not present

## 2021-01-12 DIAGNOSIS — K589 Irritable bowel syndrome without diarrhea: Secondary | ICD-10-CM | POA: Diagnosis not present

## 2021-01-12 DIAGNOSIS — L309 Dermatitis, unspecified: Secondary | ICD-10-CM | POA: Diagnosis not present

## 2021-01-12 DIAGNOSIS — M542 Cervicalgia: Secondary | ICD-10-CM | POA: Diagnosis not present

## 2021-01-12 DIAGNOSIS — E559 Vitamin D deficiency, unspecified: Secondary | ICD-10-CM | POA: Diagnosis not present

## 2021-01-18 DIAGNOSIS — K589 Irritable bowel syndrome without diarrhea: Secondary | ICD-10-CM | POA: Diagnosis not present

## 2021-01-18 DIAGNOSIS — L309 Dermatitis, unspecified: Secondary | ICD-10-CM | POA: Diagnosis not present

## 2021-01-18 DIAGNOSIS — L0390 Cellulitis, unspecified: Secondary | ICD-10-CM | POA: Diagnosis not present

## 2021-01-18 DIAGNOSIS — M109 Gout, unspecified: Secondary | ICD-10-CM | POA: Diagnosis not present

## 2021-01-18 DIAGNOSIS — E559 Vitamin D deficiency, unspecified: Secondary | ICD-10-CM | POA: Diagnosis not present

## 2021-01-18 DIAGNOSIS — I2510 Atherosclerotic heart disease of native coronary artery without angina pectoris: Secondary | ICD-10-CM | POA: Diagnosis not present

## 2021-01-18 DIAGNOSIS — M542 Cervicalgia: Secondary | ICD-10-CM | POA: Diagnosis not present

## 2021-01-18 DIAGNOSIS — G8929 Other chronic pain: Secondary | ICD-10-CM | POA: Diagnosis not present

## 2021-01-18 DIAGNOSIS — E538 Deficiency of other specified B group vitamins: Secondary | ICD-10-CM | POA: Diagnosis not present

## 2021-01-19 DIAGNOSIS — Z9181 History of falling: Secondary | ICD-10-CM | POA: Diagnosis not present

## 2021-01-19 DIAGNOSIS — E785 Hyperlipidemia, unspecified: Secondary | ICD-10-CM | POA: Diagnosis not present

## 2021-01-19 DIAGNOSIS — Z0000 Encounter for general adult medical examination without abnormal findings: Secondary | ICD-10-CM | POA: Diagnosis not present

## 2021-01-26 DIAGNOSIS — E039 Hypothyroidism, unspecified: Secondary | ICD-10-CM | POA: Diagnosis not present

## 2021-01-26 DIAGNOSIS — R0600 Dyspnea, unspecified: Secondary | ICD-10-CM | POA: Diagnosis not present

## 2021-01-26 DIAGNOSIS — Z86711 Personal history of pulmonary embolism: Secondary | ICD-10-CM | POA: Diagnosis not present

## 2021-01-26 DIAGNOSIS — G4700 Insomnia, unspecified: Secondary | ICD-10-CM | POA: Diagnosis not present

## 2021-01-26 DIAGNOSIS — M545 Low back pain, unspecified: Secondary | ICD-10-CM | POA: Diagnosis not present

## 2021-01-26 DIAGNOSIS — L0390 Cellulitis, unspecified: Secondary | ICD-10-CM | POA: Diagnosis not present

## 2021-01-26 DIAGNOSIS — K2100 Gastro-esophageal reflux disease with esophagitis, without bleeding: Secondary | ICD-10-CM | POA: Diagnosis not present

## 2021-01-26 DIAGNOSIS — E538 Deficiency of other specified B group vitamins: Secondary | ICD-10-CM | POA: Diagnosis not present

## 2021-01-26 DIAGNOSIS — J439 Emphysema, unspecified: Secondary | ICD-10-CM | POA: Diagnosis not present

## 2021-01-26 DIAGNOSIS — R609 Edema, unspecified: Secondary | ICD-10-CM | POA: Diagnosis not present

## 2021-02-07 DIAGNOSIS — R0902 Hypoxemia: Secondary | ICD-10-CM | POA: Diagnosis not present

## 2021-02-07 DIAGNOSIS — J449 Chronic obstructive pulmonary disease, unspecified: Secondary | ICD-10-CM | POA: Diagnosis not present

## 2021-02-14 DIAGNOSIS — L0390 Cellulitis, unspecified: Secondary | ICD-10-CM | POA: Diagnosis not present

## 2021-02-14 DIAGNOSIS — E039 Hypothyroidism, unspecified: Secondary | ICD-10-CM | POA: Diagnosis not present

## 2021-02-14 DIAGNOSIS — E559 Vitamin D deficiency, unspecified: Secondary | ICD-10-CM | POA: Diagnosis not present

## 2021-02-14 DIAGNOSIS — M109 Gout, unspecified: Secondary | ICD-10-CM | POA: Diagnosis not present

## 2021-02-14 DIAGNOSIS — R0600 Dyspnea, unspecified: Secondary | ICD-10-CM | POA: Diagnosis not present

## 2021-02-14 DIAGNOSIS — M542 Cervicalgia: Secondary | ICD-10-CM | POA: Diagnosis not present

## 2021-02-14 DIAGNOSIS — E538 Deficiency of other specified B group vitamins: Secondary | ICD-10-CM | POA: Diagnosis not present

## 2021-02-14 DIAGNOSIS — G8929 Other chronic pain: Secondary | ICD-10-CM | POA: Diagnosis not present

## 2021-02-14 DIAGNOSIS — K589 Irritable bowel syndrome without diarrhea: Secondary | ICD-10-CM | POA: Diagnosis not present

## 2021-02-14 DIAGNOSIS — L309 Dermatitis, unspecified: Secondary | ICD-10-CM | POA: Diagnosis not present

## 2021-02-14 DIAGNOSIS — K2100 Gastro-esophageal reflux disease with esophagitis, without bleeding: Secondary | ICD-10-CM | POA: Diagnosis not present

## 2021-02-20 DIAGNOSIS — L300 Nummular dermatitis: Secondary | ICD-10-CM | POA: Diagnosis not present

## 2021-03-08 DIAGNOSIS — Z79899 Other long term (current) drug therapy: Secondary | ICD-10-CM | POA: Diagnosis not present

## 2021-03-08 DIAGNOSIS — R531 Weakness: Secondary | ICD-10-CM | POA: Diagnosis not present

## 2021-03-10 DIAGNOSIS — J449 Chronic obstructive pulmonary disease, unspecified: Secondary | ICD-10-CM | POA: Diagnosis not present

## 2021-03-10 DIAGNOSIS — R0902 Hypoxemia: Secondary | ICD-10-CM | POA: Diagnosis not present

## 2021-03-24 DIAGNOSIS — D485 Neoplasm of uncertain behavior of skin: Secondary | ICD-10-CM | POA: Diagnosis not present

## 2021-03-24 DIAGNOSIS — L989 Disorder of the skin and subcutaneous tissue, unspecified: Secondary | ICD-10-CM | POA: Diagnosis not present

## 2021-03-26 DIAGNOSIS — K589 Irritable bowel syndrome without diarrhea: Secondary | ICD-10-CM | POA: Diagnosis not present

## 2021-03-26 DIAGNOSIS — K2100 Gastro-esophageal reflux disease with esophagitis, without bleeding: Secondary | ICD-10-CM | POA: Diagnosis not present

## 2021-03-26 DIAGNOSIS — L0390 Cellulitis, unspecified: Secondary | ICD-10-CM | POA: Diagnosis not present

## 2021-03-26 DIAGNOSIS — E039 Hypothyroidism, unspecified: Secondary | ICD-10-CM | POA: Diagnosis not present

## 2021-03-26 DIAGNOSIS — E559 Vitamin D deficiency, unspecified: Secondary | ICD-10-CM | POA: Diagnosis not present

## 2021-03-26 DIAGNOSIS — M542 Cervicalgia: Secondary | ICD-10-CM | POA: Diagnosis not present

## 2021-03-26 DIAGNOSIS — G8929 Other chronic pain: Secondary | ICD-10-CM | POA: Diagnosis not present

## 2021-03-26 DIAGNOSIS — L309 Dermatitis, unspecified: Secondary | ICD-10-CM | POA: Diagnosis not present

## 2021-03-26 DIAGNOSIS — M109 Gout, unspecified: Secondary | ICD-10-CM | POA: Diagnosis not present

## 2021-03-26 DIAGNOSIS — E538 Deficiency of other specified B group vitamins: Secondary | ICD-10-CM | POA: Diagnosis not present

## 2021-04-03 DIAGNOSIS — I83218 Varicose veins of right lower extremity with both ulcer of other part of lower extremity and inflammation: Secondary | ICD-10-CM | POA: Diagnosis not present

## 2021-04-03 DIAGNOSIS — I83228 Varicose veins of left lower extremity with both ulcer of other part of lower extremity and inflammation: Secondary | ICD-10-CM | POA: Diagnosis not present

## 2021-04-03 DIAGNOSIS — L97821 Non-pressure chronic ulcer of other part of left lower leg limited to breakdown of skin: Secondary | ICD-10-CM | POA: Diagnosis not present

## 2021-04-03 DIAGNOSIS — L03115 Cellulitis of right lower limb: Secondary | ICD-10-CM | POA: Diagnosis not present

## 2021-04-03 DIAGNOSIS — L97819 Non-pressure chronic ulcer of other part of right lower leg with unspecified severity: Secondary | ICD-10-CM | POA: Diagnosis not present

## 2021-04-04 DIAGNOSIS — E039 Hypothyroidism, unspecified: Secondary | ICD-10-CM | POA: Diagnosis not present

## 2021-04-04 DIAGNOSIS — K2100 Gastro-esophageal reflux disease with esophagitis, without bleeding: Secondary | ICD-10-CM | POA: Diagnosis not present

## 2021-04-04 DIAGNOSIS — L0390 Cellulitis, unspecified: Secondary | ICD-10-CM | POA: Diagnosis not present

## 2021-04-04 DIAGNOSIS — E559 Vitamin D deficiency, unspecified: Secondary | ICD-10-CM | POA: Diagnosis not present

## 2021-04-04 DIAGNOSIS — M109 Gout, unspecified: Secondary | ICD-10-CM | POA: Diagnosis not present

## 2021-04-04 DIAGNOSIS — M542 Cervicalgia: Secondary | ICD-10-CM | POA: Diagnosis not present

## 2021-04-04 DIAGNOSIS — E538 Deficiency of other specified B group vitamins: Secondary | ICD-10-CM | POA: Diagnosis not present

## 2021-04-04 DIAGNOSIS — I10 Essential (primary) hypertension: Secondary | ICD-10-CM | POA: Diagnosis not present

## 2021-04-04 DIAGNOSIS — K589 Irritable bowel syndrome without diarrhea: Secondary | ICD-10-CM | POA: Diagnosis not present

## 2021-04-04 DIAGNOSIS — G8929 Other chronic pain: Secondary | ICD-10-CM | POA: Diagnosis not present

## 2021-04-04 DIAGNOSIS — E785 Hyperlipidemia, unspecified: Secondary | ICD-10-CM | POA: Diagnosis not present

## 2021-04-04 DIAGNOSIS — L309 Dermatitis, unspecified: Secondary | ICD-10-CM | POA: Diagnosis not present

## 2021-04-07 DIAGNOSIS — J449 Chronic obstructive pulmonary disease, unspecified: Secondary | ICD-10-CM | POA: Diagnosis not present

## 2021-04-07 DIAGNOSIS — R0902 Hypoxemia: Secondary | ICD-10-CM | POA: Diagnosis not present

## 2021-04-09 DIAGNOSIS — I83218 Varicose veins of right lower extremity with both ulcer of other part of lower extremity and inflammation: Secondary | ICD-10-CM | POA: Diagnosis not present

## 2021-04-09 DIAGNOSIS — L97821 Non-pressure chronic ulcer of other part of left lower leg limited to breakdown of skin: Secondary | ICD-10-CM | POA: Diagnosis not present

## 2021-04-09 DIAGNOSIS — L97819 Non-pressure chronic ulcer of other part of right lower leg with unspecified severity: Secondary | ICD-10-CM | POA: Diagnosis not present

## 2021-04-09 DIAGNOSIS — L03115 Cellulitis of right lower limb: Secondary | ICD-10-CM | POA: Diagnosis not present

## 2021-04-09 DIAGNOSIS — I83228 Varicose veins of left lower extremity with both ulcer of other part of lower extremity and inflammation: Secondary | ICD-10-CM | POA: Diagnosis not present

## 2021-04-16 DIAGNOSIS — K2100 Gastro-esophageal reflux disease with esophagitis, without bleeding: Secondary | ICD-10-CM | POA: Diagnosis not present

## 2021-04-16 DIAGNOSIS — I83228 Varicose veins of left lower extremity with both ulcer of other part of lower extremity and inflammation: Secondary | ICD-10-CM | POA: Diagnosis not present

## 2021-04-16 DIAGNOSIS — L97829 Non-pressure chronic ulcer of other part of left lower leg with unspecified severity: Secondary | ICD-10-CM | POA: Diagnosis not present

## 2021-04-16 DIAGNOSIS — K589 Irritable bowel syndrome without diarrhea: Secondary | ICD-10-CM | POA: Diagnosis not present

## 2021-04-16 DIAGNOSIS — M109 Gout, unspecified: Secondary | ICD-10-CM | POA: Diagnosis not present

## 2021-04-16 DIAGNOSIS — E538 Deficiency of other specified B group vitamins: Secondary | ICD-10-CM | POA: Diagnosis not present

## 2021-04-16 DIAGNOSIS — L97819 Non-pressure chronic ulcer of other part of right lower leg with unspecified severity: Secondary | ICD-10-CM | POA: Diagnosis not present

## 2021-04-16 DIAGNOSIS — L0390 Cellulitis, unspecified: Secondary | ICD-10-CM | POA: Diagnosis not present

## 2021-04-16 DIAGNOSIS — L309 Dermatitis, unspecified: Secondary | ICD-10-CM | POA: Diagnosis not present

## 2021-04-16 DIAGNOSIS — E559 Vitamin D deficiency, unspecified: Secondary | ICD-10-CM | POA: Diagnosis not present

## 2021-04-16 DIAGNOSIS — M542 Cervicalgia: Secondary | ICD-10-CM | POA: Diagnosis not present

## 2021-04-16 DIAGNOSIS — I83218 Varicose veins of right lower extremity with both ulcer of other part of lower extremity and inflammation: Secondary | ICD-10-CM | POA: Diagnosis not present

## 2021-04-16 DIAGNOSIS — G8929 Other chronic pain: Secondary | ICD-10-CM | POA: Diagnosis not present

## 2021-04-24 DIAGNOSIS — L97819 Non-pressure chronic ulcer of other part of right lower leg with unspecified severity: Secondary | ICD-10-CM | POA: Diagnosis not present

## 2021-04-24 DIAGNOSIS — I83218 Varicose veins of right lower extremity with both ulcer of other part of lower extremity and inflammation: Secondary | ICD-10-CM | POA: Diagnosis not present

## 2021-04-24 DIAGNOSIS — L97829 Non-pressure chronic ulcer of other part of left lower leg with unspecified severity: Secondary | ICD-10-CM | POA: Diagnosis not present

## 2021-04-24 DIAGNOSIS — I83228 Varicose veins of left lower extremity with both ulcer of other part of lower extremity and inflammation: Secondary | ICD-10-CM | POA: Diagnosis not present

## 2021-04-30 DIAGNOSIS — L0390 Cellulitis, unspecified: Secondary | ICD-10-CM | POA: Diagnosis not present

## 2021-04-30 DIAGNOSIS — R0600 Dyspnea, unspecified: Secondary | ICD-10-CM | POA: Diagnosis not present

## 2021-04-30 DIAGNOSIS — Z139 Encounter for screening, unspecified: Secondary | ICD-10-CM | POA: Diagnosis not present

## 2021-04-30 DIAGNOSIS — K2100 Gastro-esophageal reflux disease with esophagitis, without bleeding: Secondary | ICD-10-CM | POA: Diagnosis not present

## 2021-04-30 DIAGNOSIS — E039 Hypothyroidism, unspecified: Secondary | ICD-10-CM | POA: Diagnosis not present

## 2021-04-30 DIAGNOSIS — G4700 Insomnia, unspecified: Secondary | ICD-10-CM | POA: Diagnosis not present

## 2021-04-30 DIAGNOSIS — E538 Deficiency of other specified B group vitamins: Secondary | ICD-10-CM | POA: Diagnosis not present

## 2021-04-30 DIAGNOSIS — R609 Edema, unspecified: Secondary | ICD-10-CM | POA: Diagnosis not present

## 2021-04-30 DIAGNOSIS — K589 Irritable bowel syndrome without diarrhea: Secondary | ICD-10-CM | POA: Diagnosis not present

## 2021-04-30 DIAGNOSIS — Z86711 Personal history of pulmonary embolism: Secondary | ICD-10-CM | POA: Diagnosis not present

## 2021-05-08 DIAGNOSIS — R0902 Hypoxemia: Secondary | ICD-10-CM | POA: Diagnosis not present

## 2021-05-08 DIAGNOSIS — J449 Chronic obstructive pulmonary disease, unspecified: Secondary | ICD-10-CM | POA: Diagnosis not present

## 2021-05-09 DIAGNOSIS — L300 Nummular dermatitis: Secondary | ICD-10-CM | POA: Diagnosis not present

## 2021-05-09 DIAGNOSIS — L299 Pruritus, unspecified: Secondary | ICD-10-CM | POA: Diagnosis not present

## 2021-05-11 DIAGNOSIS — I83228 Varicose veins of left lower extremity with both ulcer of other part of lower extremity and inflammation: Secondary | ICD-10-CM | POA: Diagnosis not present

## 2021-05-11 DIAGNOSIS — I83218 Varicose veins of right lower extremity with both ulcer of other part of lower extremity and inflammation: Secondary | ICD-10-CM | POA: Diagnosis not present

## 2021-05-11 DIAGNOSIS — L97829 Non-pressure chronic ulcer of other part of left lower leg with unspecified severity: Secondary | ICD-10-CM | POA: Diagnosis not present

## 2021-05-11 DIAGNOSIS — L97819 Non-pressure chronic ulcer of other part of right lower leg with unspecified severity: Secondary | ICD-10-CM | POA: Diagnosis not present

## 2021-05-29 DIAGNOSIS — K2100 Gastro-esophageal reflux disease with esophagitis, without bleeding: Secondary | ICD-10-CM | POA: Diagnosis not present

## 2021-05-29 DIAGNOSIS — Z139 Encounter for screening, unspecified: Secondary | ICD-10-CM | POA: Diagnosis not present

## 2021-05-29 DIAGNOSIS — G8929 Other chronic pain: Secondary | ICD-10-CM | POA: Diagnosis not present

## 2021-05-29 DIAGNOSIS — E538 Deficiency of other specified B group vitamins: Secondary | ICD-10-CM | POA: Diagnosis not present

## 2021-05-29 DIAGNOSIS — M109 Gout, unspecified: Secondary | ICD-10-CM | POA: Diagnosis not present

## 2021-05-29 DIAGNOSIS — M542 Cervicalgia: Secondary | ICD-10-CM | POA: Diagnosis not present

## 2021-05-29 DIAGNOSIS — K589 Irritable bowel syndrome without diarrhea: Secondary | ICD-10-CM | POA: Diagnosis not present

## 2021-05-29 DIAGNOSIS — J208 Acute bronchitis due to other specified organisms: Secondary | ICD-10-CM | POA: Diagnosis not present

## 2021-05-29 DIAGNOSIS — E559 Vitamin D deficiency, unspecified: Secondary | ICD-10-CM | POA: Diagnosis not present

## 2021-05-29 DIAGNOSIS — L309 Dermatitis, unspecified: Secondary | ICD-10-CM | POA: Diagnosis not present

## 2021-05-29 DIAGNOSIS — I2510 Atherosclerotic heart disease of native coronary artery without angina pectoris: Secondary | ICD-10-CM | POA: Diagnosis not present

## 2021-06-07 DIAGNOSIS — R531 Weakness: Secondary | ICD-10-CM | POA: Diagnosis not present

## 2021-06-07 DIAGNOSIS — I10 Essential (primary) hypertension: Secondary | ICD-10-CM | POA: Diagnosis not present

## 2021-06-07 DIAGNOSIS — R0902 Hypoxemia: Secondary | ICD-10-CM | POA: Diagnosis not present

## 2021-06-07 DIAGNOSIS — N390 Urinary tract infection, site not specified: Secondary | ICD-10-CM | POA: Diagnosis not present

## 2021-06-07 DIAGNOSIS — J449 Chronic obstructive pulmonary disease, unspecified: Secondary | ICD-10-CM | POA: Diagnosis not present

## 2021-06-07 DIAGNOSIS — Z743 Need for continuous supervision: Secondary | ICD-10-CM | POA: Diagnosis not present

## 2021-06-07 DIAGNOSIS — R1111 Vomiting without nausea: Secondary | ICD-10-CM | POA: Diagnosis not present

## 2021-06-26 DIAGNOSIS — I2510 Atherosclerotic heart disease of native coronary artery without angina pectoris: Secondary | ICD-10-CM | POA: Diagnosis not present

## 2021-06-26 DIAGNOSIS — M109 Gout, unspecified: Secondary | ICD-10-CM | POA: Diagnosis not present

## 2021-06-26 DIAGNOSIS — L309 Dermatitis, unspecified: Secondary | ICD-10-CM | POA: Diagnosis not present

## 2021-06-26 DIAGNOSIS — N390 Urinary tract infection, site not specified: Secondary | ICD-10-CM | POA: Diagnosis not present

## 2021-06-26 DIAGNOSIS — I10 Essential (primary) hypertension: Secondary | ICD-10-CM | POA: Diagnosis not present

## 2021-06-26 DIAGNOSIS — Z139 Encounter for screening, unspecified: Secondary | ICD-10-CM | POA: Diagnosis not present

## 2021-06-26 DIAGNOSIS — E559 Vitamin D deficiency, unspecified: Secondary | ICD-10-CM | POA: Diagnosis not present

## 2021-06-26 DIAGNOSIS — M545 Low back pain, unspecified: Secondary | ICD-10-CM | POA: Diagnosis not present

## 2021-06-26 DIAGNOSIS — G8929 Other chronic pain: Secondary | ICD-10-CM | POA: Diagnosis not present

## 2021-06-26 DIAGNOSIS — M542 Cervicalgia: Secondary | ICD-10-CM | POA: Diagnosis not present

## 2021-06-26 DIAGNOSIS — K589 Irritable bowel syndrome without diarrhea: Secondary | ICD-10-CM | POA: Diagnosis not present

## 2021-07-02 DIAGNOSIS — M1990 Unspecified osteoarthritis, unspecified site: Secondary | ICD-10-CM | POA: Diagnosis not present

## 2021-07-02 DIAGNOSIS — J439 Emphysema, unspecified: Secondary | ICD-10-CM | POA: Diagnosis not present

## 2021-07-02 DIAGNOSIS — R062 Wheezing: Secondary | ICD-10-CM | POA: Diagnosis not present

## 2021-07-02 DIAGNOSIS — J9621 Acute and chronic respiratory failure with hypoxia: Secondary | ICD-10-CM | POA: Diagnosis not present

## 2021-07-02 DIAGNOSIS — Z87891 Personal history of nicotine dependence: Secondary | ICD-10-CM | POA: Diagnosis not present

## 2021-07-02 DIAGNOSIS — R0902 Hypoxemia: Secondary | ICD-10-CM | POA: Diagnosis not present

## 2021-07-02 DIAGNOSIS — E7800 Pure hypercholesterolemia, unspecified: Secondary | ICD-10-CM | POA: Diagnosis not present

## 2021-07-02 DIAGNOSIS — I739 Peripheral vascular disease, unspecified: Secondary | ICD-10-CM | POA: Diagnosis not present

## 2021-07-02 DIAGNOSIS — J441 Chronic obstructive pulmonary disease with (acute) exacerbation: Secondary | ICD-10-CM | POA: Diagnosis not present

## 2021-07-02 DIAGNOSIS — Z20822 Contact with and (suspected) exposure to covid-19: Secondary | ICD-10-CM | POA: Diagnosis not present

## 2021-07-02 DIAGNOSIS — Z7901 Long term (current) use of anticoagulants: Secondary | ICD-10-CM | POA: Diagnosis not present

## 2021-07-02 DIAGNOSIS — Z881 Allergy status to other antibiotic agents status: Secondary | ICD-10-CM | POA: Diagnosis not present

## 2021-07-02 DIAGNOSIS — I252 Old myocardial infarction: Secondary | ICD-10-CM | POA: Diagnosis not present

## 2021-07-02 DIAGNOSIS — M109 Gout, unspecified: Secondary | ICD-10-CM | POA: Diagnosis not present

## 2021-07-02 DIAGNOSIS — Z9981 Dependence on supplemental oxygen: Secondary | ICD-10-CM | POA: Diagnosis not present

## 2021-07-02 DIAGNOSIS — Z882 Allergy status to sulfonamides status: Secondary | ICD-10-CM | POA: Diagnosis not present

## 2021-07-02 DIAGNOSIS — I10 Essential (primary) hypertension: Secondary | ICD-10-CM | POA: Diagnosis not present

## 2021-07-02 DIAGNOSIS — I2510 Atherosclerotic heart disease of native coronary artery without angina pectoris: Secondary | ICD-10-CM | POA: Diagnosis not present

## 2021-07-02 DIAGNOSIS — Z86718 Personal history of other venous thrombosis and embolism: Secondary | ICD-10-CM | POA: Diagnosis not present

## 2021-07-02 DIAGNOSIS — Z888 Allergy status to other drugs, medicaments and biological substances status: Secondary | ICD-10-CM | POA: Diagnosis not present

## 2021-07-02 DIAGNOSIS — J8283 Eosinophilic asthma: Secondary | ICD-10-CM | POA: Diagnosis not present

## 2021-07-02 DIAGNOSIS — J80 Acute respiratory distress syndrome: Secondary | ICD-10-CM | POA: Diagnosis not present

## 2021-07-02 DIAGNOSIS — E039 Hypothyroidism, unspecified: Secondary | ICD-10-CM | POA: Diagnosis not present

## 2021-07-02 DIAGNOSIS — R0602 Shortness of breath: Secondary | ICD-10-CM | POA: Diagnosis not present

## 2021-07-02 DIAGNOSIS — J45909 Unspecified asthma, uncomplicated: Secondary | ICD-10-CM | POA: Diagnosis not present

## 2021-07-02 DIAGNOSIS — Z86711 Personal history of pulmonary embolism: Secondary | ICD-10-CM | POA: Diagnosis not present

## 2021-07-02 DIAGNOSIS — J440 Chronic obstructive pulmonary disease with acute lower respiratory infection: Secondary | ICD-10-CM | POA: Diagnosis not present

## 2021-07-02 DIAGNOSIS — R059 Cough, unspecified: Secondary | ICD-10-CM | POA: Diagnosis not present

## 2021-07-02 DIAGNOSIS — I959 Hypotension, unspecified: Secondary | ICD-10-CM | POA: Diagnosis not present

## 2021-07-02 DIAGNOSIS — I4510 Unspecified right bundle-branch block: Secondary | ICD-10-CM | POA: Diagnosis not present

## 2021-07-02 DIAGNOSIS — K219 Gastro-esophageal reflux disease without esophagitis: Secondary | ICD-10-CM | POA: Diagnosis not present

## 2021-07-02 DIAGNOSIS — J209 Acute bronchitis, unspecified: Secondary | ICD-10-CM | POA: Diagnosis not present

## 2021-07-08 DIAGNOSIS — J449 Chronic obstructive pulmonary disease, unspecified: Secondary | ICD-10-CM | POA: Diagnosis not present

## 2021-07-08 DIAGNOSIS — R0902 Hypoxemia: Secondary | ICD-10-CM | POA: Diagnosis not present

## 2021-07-09 DIAGNOSIS — I890 Lymphedema, not elsewhere classified: Secondary | ICD-10-CM | POA: Diagnosis not present

## 2021-07-09 DIAGNOSIS — E7800 Pure hypercholesterolemia, unspecified: Secondary | ICD-10-CM | POA: Diagnosis not present

## 2021-07-09 DIAGNOSIS — Z7901 Long term (current) use of anticoagulants: Secondary | ICD-10-CM | POA: Diagnosis not present

## 2021-07-09 DIAGNOSIS — E039 Hypothyroidism, unspecified: Secondary | ICD-10-CM | POA: Diagnosis not present

## 2021-07-09 DIAGNOSIS — Z9981 Dependence on supplemental oxygen: Secondary | ICD-10-CM | POA: Diagnosis not present

## 2021-07-09 DIAGNOSIS — K5790 Diverticulosis of intestine, part unspecified, without perforation or abscess without bleeding: Secondary | ICD-10-CM | POA: Diagnosis not present

## 2021-07-09 DIAGNOSIS — I10 Essential (primary) hypertension: Secondary | ICD-10-CM | POA: Diagnosis not present

## 2021-07-09 DIAGNOSIS — J9621 Acute and chronic respiratory failure with hypoxia: Secondary | ICD-10-CM | POA: Diagnosis not present

## 2021-07-09 DIAGNOSIS — I252 Old myocardial infarction: Secondary | ICD-10-CM | POA: Diagnosis not present

## 2021-07-09 DIAGNOSIS — I2510 Atherosclerotic heart disease of native coronary artery without angina pectoris: Secondary | ICD-10-CM | POA: Diagnosis not present

## 2021-07-09 DIAGNOSIS — K219 Gastro-esophageal reflux disease without esophagitis: Secondary | ICD-10-CM | POA: Diagnosis not present

## 2021-07-09 DIAGNOSIS — M519 Unspecified thoracic, thoracolumbar and lumbosacral intervertebral disc disorder: Secondary | ICD-10-CM | POA: Diagnosis not present

## 2021-07-09 DIAGNOSIS — J441 Chronic obstructive pulmonary disease with (acute) exacerbation: Secondary | ICD-10-CM | POA: Diagnosis not present

## 2021-07-09 DIAGNOSIS — M109 Gout, unspecified: Secondary | ICD-10-CM | POA: Diagnosis not present

## 2021-07-09 DIAGNOSIS — M1990 Unspecified osteoarthritis, unspecified site: Secondary | ICD-10-CM | POA: Diagnosis not present

## 2021-07-09 DIAGNOSIS — Z86711 Personal history of pulmonary embolism: Secondary | ICD-10-CM | POA: Diagnosis not present

## 2021-07-09 DIAGNOSIS — J440 Chronic obstructive pulmonary disease with acute lower respiratory infection: Secondary | ICD-10-CM | POA: Diagnosis not present

## 2021-07-09 DIAGNOSIS — Z79891 Long term (current) use of opiate analgesic: Secondary | ICD-10-CM | POA: Diagnosis not present

## 2021-07-09 DIAGNOSIS — D7219 Other eosinophilia: Secondary | ICD-10-CM | POA: Diagnosis not present

## 2021-07-09 DIAGNOSIS — Z87891 Personal history of nicotine dependence: Secondary | ICD-10-CM | POA: Diagnosis not present

## 2021-07-09 DIAGNOSIS — J209 Acute bronchitis, unspecified: Secondary | ICD-10-CM | POA: Diagnosis not present

## 2021-07-09 DIAGNOSIS — S81802D Unspecified open wound, left lower leg, subsequent encounter: Secondary | ICD-10-CM | POA: Diagnosis not present

## 2021-07-09 DIAGNOSIS — Z86718 Personal history of other venous thrombosis and embolism: Secondary | ICD-10-CM | POA: Diagnosis not present

## 2021-07-09 DIAGNOSIS — I739 Peripheral vascular disease, unspecified: Secondary | ICD-10-CM | POA: Diagnosis not present

## 2021-07-11 DIAGNOSIS — G8929 Other chronic pain: Secondary | ICD-10-CM | POA: Diagnosis not present

## 2021-07-11 DIAGNOSIS — I10 Essential (primary) hypertension: Secondary | ICD-10-CM | POA: Diagnosis not present

## 2021-07-11 DIAGNOSIS — K2100 Gastro-esophageal reflux disease with esophagitis, without bleeding: Secondary | ICD-10-CM | POA: Diagnosis not present

## 2021-07-11 DIAGNOSIS — M109 Gout, unspecified: Secondary | ICD-10-CM | POA: Diagnosis not present

## 2021-07-11 DIAGNOSIS — E559 Vitamin D deficiency, unspecified: Secondary | ICD-10-CM | POA: Diagnosis not present

## 2021-07-11 DIAGNOSIS — E538 Deficiency of other specified B group vitamins: Secondary | ICD-10-CM | POA: Diagnosis not present

## 2021-07-11 DIAGNOSIS — M545 Low back pain, unspecified: Secondary | ICD-10-CM | POA: Diagnosis not present

## 2021-07-11 DIAGNOSIS — M542 Cervicalgia: Secondary | ICD-10-CM | POA: Diagnosis not present

## 2021-07-11 DIAGNOSIS — K589 Irritable bowel syndrome without diarrhea: Secondary | ICD-10-CM | POA: Diagnosis not present

## 2021-07-11 DIAGNOSIS — L309 Dermatitis, unspecified: Secondary | ICD-10-CM | POA: Diagnosis not present

## 2021-07-11 DIAGNOSIS — I2510 Atherosclerotic heart disease of native coronary artery without angina pectoris: Secondary | ICD-10-CM | POA: Diagnosis not present

## 2021-07-12 DIAGNOSIS — K219 Gastro-esophageal reflux disease without esophagitis: Secondary | ICD-10-CM | POA: Diagnosis not present

## 2021-07-12 DIAGNOSIS — Z86718 Personal history of other venous thrombosis and embolism: Secondary | ICD-10-CM | POA: Diagnosis not present

## 2021-07-12 DIAGNOSIS — J441 Chronic obstructive pulmonary disease with (acute) exacerbation: Secondary | ICD-10-CM | POA: Diagnosis not present

## 2021-07-12 DIAGNOSIS — J440 Chronic obstructive pulmonary disease with acute lower respiratory infection: Secondary | ICD-10-CM | POA: Diagnosis not present

## 2021-07-12 DIAGNOSIS — I739 Peripheral vascular disease, unspecified: Secondary | ICD-10-CM | POA: Diagnosis not present

## 2021-07-12 DIAGNOSIS — J9621 Acute and chronic respiratory failure with hypoxia: Secondary | ICD-10-CM | POA: Diagnosis not present

## 2021-07-12 DIAGNOSIS — Z79891 Long term (current) use of opiate analgesic: Secondary | ICD-10-CM | POA: Diagnosis not present

## 2021-07-12 DIAGNOSIS — J209 Acute bronchitis, unspecified: Secondary | ICD-10-CM | POA: Diagnosis not present

## 2021-07-12 DIAGNOSIS — M1990 Unspecified osteoarthritis, unspecified site: Secondary | ICD-10-CM | POA: Diagnosis not present

## 2021-07-12 DIAGNOSIS — Z87891 Personal history of nicotine dependence: Secondary | ICD-10-CM | POA: Diagnosis not present

## 2021-07-12 DIAGNOSIS — I2510 Atherosclerotic heart disease of native coronary artery without angina pectoris: Secondary | ICD-10-CM | POA: Diagnosis not present

## 2021-07-12 DIAGNOSIS — E7800 Pure hypercholesterolemia, unspecified: Secondary | ICD-10-CM | POA: Diagnosis not present

## 2021-07-12 DIAGNOSIS — M109 Gout, unspecified: Secondary | ICD-10-CM | POA: Diagnosis not present

## 2021-07-12 DIAGNOSIS — I890 Lymphedema, not elsewhere classified: Secondary | ICD-10-CM | POA: Diagnosis not present

## 2021-07-12 DIAGNOSIS — I252 Old myocardial infarction: Secondary | ICD-10-CM | POA: Diagnosis not present

## 2021-07-12 DIAGNOSIS — Z86711 Personal history of pulmonary embolism: Secondary | ICD-10-CM | POA: Diagnosis not present

## 2021-07-12 DIAGNOSIS — K5790 Diverticulosis of intestine, part unspecified, without perforation or abscess without bleeding: Secondary | ICD-10-CM | POA: Diagnosis not present

## 2021-07-12 DIAGNOSIS — Z9981 Dependence on supplemental oxygen: Secondary | ICD-10-CM | POA: Diagnosis not present

## 2021-07-12 DIAGNOSIS — E039 Hypothyroidism, unspecified: Secondary | ICD-10-CM | POA: Diagnosis not present

## 2021-07-12 DIAGNOSIS — Z7901 Long term (current) use of anticoagulants: Secondary | ICD-10-CM | POA: Diagnosis not present

## 2021-07-12 DIAGNOSIS — D7219 Other eosinophilia: Secondary | ICD-10-CM | POA: Diagnosis not present

## 2021-07-12 DIAGNOSIS — M519 Unspecified thoracic, thoracolumbar and lumbosacral intervertebral disc disorder: Secondary | ICD-10-CM | POA: Diagnosis not present

## 2021-07-12 DIAGNOSIS — I10 Essential (primary) hypertension: Secondary | ICD-10-CM | POA: Diagnosis not present

## 2021-07-12 DIAGNOSIS — S81802D Unspecified open wound, left lower leg, subsequent encounter: Secondary | ICD-10-CM | POA: Diagnosis not present

## 2021-07-13 DIAGNOSIS — I890 Lymphedema, not elsewhere classified: Secondary | ICD-10-CM | POA: Diagnosis not present

## 2021-07-13 DIAGNOSIS — I2510 Atherosclerotic heart disease of native coronary artery without angina pectoris: Secondary | ICD-10-CM | POA: Diagnosis not present

## 2021-07-13 DIAGNOSIS — J9621 Acute and chronic respiratory failure with hypoxia: Secondary | ICD-10-CM | POA: Diagnosis not present

## 2021-07-13 DIAGNOSIS — Z9981 Dependence on supplemental oxygen: Secondary | ICD-10-CM | POA: Diagnosis not present

## 2021-07-13 DIAGNOSIS — K5790 Diverticulosis of intestine, part unspecified, without perforation or abscess without bleeding: Secondary | ICD-10-CM | POA: Diagnosis not present

## 2021-07-13 DIAGNOSIS — J209 Acute bronchitis, unspecified: Secondary | ICD-10-CM | POA: Diagnosis not present

## 2021-07-13 DIAGNOSIS — I739 Peripheral vascular disease, unspecified: Secondary | ICD-10-CM | POA: Diagnosis not present

## 2021-07-13 DIAGNOSIS — Z7901 Long term (current) use of anticoagulants: Secondary | ICD-10-CM | POA: Diagnosis not present

## 2021-07-13 DIAGNOSIS — S81802D Unspecified open wound, left lower leg, subsequent encounter: Secondary | ICD-10-CM | POA: Diagnosis not present

## 2021-07-13 DIAGNOSIS — E039 Hypothyroidism, unspecified: Secondary | ICD-10-CM | POA: Diagnosis not present

## 2021-07-13 DIAGNOSIS — J441 Chronic obstructive pulmonary disease with (acute) exacerbation: Secondary | ICD-10-CM | POA: Diagnosis not present

## 2021-07-13 DIAGNOSIS — Z87891 Personal history of nicotine dependence: Secondary | ICD-10-CM | POA: Diagnosis not present

## 2021-07-13 DIAGNOSIS — M519 Unspecified thoracic, thoracolumbar and lumbosacral intervertebral disc disorder: Secondary | ICD-10-CM | POA: Diagnosis not present

## 2021-07-13 DIAGNOSIS — Z79891 Long term (current) use of opiate analgesic: Secondary | ICD-10-CM | POA: Diagnosis not present

## 2021-07-13 DIAGNOSIS — K219 Gastro-esophageal reflux disease without esophagitis: Secondary | ICD-10-CM | POA: Diagnosis not present

## 2021-07-13 DIAGNOSIS — Z86711 Personal history of pulmonary embolism: Secondary | ICD-10-CM | POA: Diagnosis not present

## 2021-07-13 DIAGNOSIS — J440 Chronic obstructive pulmonary disease with acute lower respiratory infection: Secondary | ICD-10-CM | POA: Diagnosis not present

## 2021-07-13 DIAGNOSIS — I10 Essential (primary) hypertension: Secondary | ICD-10-CM | POA: Diagnosis not present

## 2021-07-13 DIAGNOSIS — M109 Gout, unspecified: Secondary | ICD-10-CM | POA: Diagnosis not present

## 2021-07-13 DIAGNOSIS — E7800 Pure hypercholesterolemia, unspecified: Secondary | ICD-10-CM | POA: Diagnosis not present

## 2021-07-13 DIAGNOSIS — D7219 Other eosinophilia: Secondary | ICD-10-CM | POA: Diagnosis not present

## 2021-07-13 DIAGNOSIS — I252 Old myocardial infarction: Secondary | ICD-10-CM | POA: Diagnosis not present

## 2021-07-13 DIAGNOSIS — M1990 Unspecified osteoarthritis, unspecified site: Secondary | ICD-10-CM | POA: Diagnosis not present

## 2021-07-13 DIAGNOSIS — Z86718 Personal history of other venous thrombosis and embolism: Secondary | ICD-10-CM | POA: Diagnosis not present

## 2021-07-18 DIAGNOSIS — I252 Old myocardial infarction: Secondary | ICD-10-CM | POA: Diagnosis not present

## 2021-07-18 DIAGNOSIS — Z7901 Long term (current) use of anticoagulants: Secondary | ICD-10-CM | POA: Diagnosis not present

## 2021-07-18 DIAGNOSIS — I2510 Atherosclerotic heart disease of native coronary artery without angina pectoris: Secondary | ICD-10-CM | POA: Diagnosis not present

## 2021-07-18 DIAGNOSIS — I10 Essential (primary) hypertension: Secondary | ICD-10-CM | POA: Diagnosis not present

## 2021-07-18 DIAGNOSIS — M1990 Unspecified osteoarthritis, unspecified site: Secondary | ICD-10-CM | POA: Diagnosis not present

## 2021-07-18 DIAGNOSIS — D7219 Other eosinophilia: Secondary | ICD-10-CM | POA: Diagnosis not present

## 2021-07-18 DIAGNOSIS — I739 Peripheral vascular disease, unspecified: Secondary | ICD-10-CM | POA: Diagnosis not present

## 2021-07-18 DIAGNOSIS — J209 Acute bronchitis, unspecified: Secondary | ICD-10-CM | POA: Diagnosis not present

## 2021-07-18 DIAGNOSIS — Z87891 Personal history of nicotine dependence: Secondary | ICD-10-CM | POA: Diagnosis not present

## 2021-07-18 DIAGNOSIS — M519 Unspecified thoracic, thoracolumbar and lumbosacral intervertebral disc disorder: Secondary | ICD-10-CM | POA: Diagnosis not present

## 2021-07-18 DIAGNOSIS — K5790 Diverticulosis of intestine, part unspecified, without perforation or abscess without bleeding: Secondary | ICD-10-CM | POA: Diagnosis not present

## 2021-07-18 DIAGNOSIS — Z86711 Personal history of pulmonary embolism: Secondary | ICD-10-CM | POA: Diagnosis not present

## 2021-07-18 DIAGNOSIS — E7800 Pure hypercholesterolemia, unspecified: Secondary | ICD-10-CM | POA: Diagnosis not present

## 2021-07-18 DIAGNOSIS — I890 Lymphedema, not elsewhere classified: Secondary | ICD-10-CM | POA: Diagnosis not present

## 2021-07-18 DIAGNOSIS — E039 Hypothyroidism, unspecified: Secondary | ICD-10-CM | POA: Diagnosis not present

## 2021-07-18 DIAGNOSIS — Z79891 Long term (current) use of opiate analgesic: Secondary | ICD-10-CM | POA: Diagnosis not present

## 2021-07-18 DIAGNOSIS — K219 Gastro-esophageal reflux disease without esophagitis: Secondary | ICD-10-CM | POA: Diagnosis not present

## 2021-07-18 DIAGNOSIS — Z86718 Personal history of other venous thrombosis and embolism: Secondary | ICD-10-CM | POA: Diagnosis not present

## 2021-07-18 DIAGNOSIS — S81802D Unspecified open wound, left lower leg, subsequent encounter: Secondary | ICD-10-CM | POA: Diagnosis not present

## 2021-07-18 DIAGNOSIS — J440 Chronic obstructive pulmonary disease with acute lower respiratory infection: Secondary | ICD-10-CM | POA: Diagnosis not present

## 2021-07-18 DIAGNOSIS — J9621 Acute and chronic respiratory failure with hypoxia: Secondary | ICD-10-CM | POA: Diagnosis not present

## 2021-07-18 DIAGNOSIS — J441 Chronic obstructive pulmonary disease with (acute) exacerbation: Secondary | ICD-10-CM | POA: Diagnosis not present

## 2021-07-18 DIAGNOSIS — Z9981 Dependence on supplemental oxygen: Secondary | ICD-10-CM | POA: Diagnosis not present

## 2021-07-18 DIAGNOSIS — M109 Gout, unspecified: Secondary | ICD-10-CM | POA: Diagnosis not present

## 2021-07-20 DIAGNOSIS — Z87891 Personal history of nicotine dependence: Secondary | ICD-10-CM | POA: Diagnosis not present

## 2021-07-20 DIAGNOSIS — M519 Unspecified thoracic, thoracolumbar and lumbosacral intervertebral disc disorder: Secondary | ICD-10-CM | POA: Diagnosis not present

## 2021-07-20 DIAGNOSIS — E039 Hypothyroidism, unspecified: Secondary | ICD-10-CM | POA: Diagnosis not present

## 2021-07-20 DIAGNOSIS — Z9981 Dependence on supplemental oxygen: Secondary | ICD-10-CM | POA: Diagnosis not present

## 2021-07-20 DIAGNOSIS — L299 Pruritus, unspecified: Secondary | ICD-10-CM | POA: Diagnosis not present

## 2021-07-20 DIAGNOSIS — I10 Essential (primary) hypertension: Secondary | ICD-10-CM | POA: Diagnosis not present

## 2021-07-20 DIAGNOSIS — Z86711 Personal history of pulmonary embolism: Secondary | ICD-10-CM | POA: Diagnosis not present

## 2021-07-20 DIAGNOSIS — L300 Nummular dermatitis: Secondary | ICD-10-CM | POA: Diagnosis not present

## 2021-07-20 DIAGNOSIS — J209 Acute bronchitis, unspecified: Secondary | ICD-10-CM | POA: Diagnosis not present

## 2021-07-20 DIAGNOSIS — S81802D Unspecified open wound, left lower leg, subsequent encounter: Secondary | ICD-10-CM | POA: Diagnosis not present

## 2021-07-20 DIAGNOSIS — K5790 Diverticulosis of intestine, part unspecified, without perforation or abscess without bleeding: Secondary | ICD-10-CM | POA: Diagnosis not present

## 2021-07-20 DIAGNOSIS — E7800 Pure hypercholesterolemia, unspecified: Secondary | ICD-10-CM | POA: Diagnosis not present

## 2021-07-20 DIAGNOSIS — J441 Chronic obstructive pulmonary disease with (acute) exacerbation: Secondary | ICD-10-CM | POA: Diagnosis not present

## 2021-07-20 DIAGNOSIS — M109 Gout, unspecified: Secondary | ICD-10-CM | POA: Diagnosis not present

## 2021-07-20 DIAGNOSIS — D7219 Other eosinophilia: Secondary | ICD-10-CM | POA: Diagnosis not present

## 2021-07-20 DIAGNOSIS — I890 Lymphedema, not elsewhere classified: Secondary | ICD-10-CM | POA: Diagnosis not present

## 2021-07-20 DIAGNOSIS — I739 Peripheral vascular disease, unspecified: Secondary | ICD-10-CM | POA: Diagnosis not present

## 2021-07-20 DIAGNOSIS — M1990 Unspecified osteoarthritis, unspecified site: Secondary | ICD-10-CM | POA: Diagnosis not present

## 2021-07-20 DIAGNOSIS — Z7901 Long term (current) use of anticoagulants: Secondary | ICD-10-CM | POA: Diagnosis not present

## 2021-07-20 DIAGNOSIS — I252 Old myocardial infarction: Secondary | ICD-10-CM | POA: Diagnosis not present

## 2021-07-20 DIAGNOSIS — J9621 Acute and chronic respiratory failure with hypoxia: Secondary | ICD-10-CM | POA: Diagnosis not present

## 2021-07-20 DIAGNOSIS — J440 Chronic obstructive pulmonary disease with acute lower respiratory infection: Secondary | ICD-10-CM | POA: Diagnosis not present

## 2021-07-20 DIAGNOSIS — Z86718 Personal history of other venous thrombosis and embolism: Secondary | ICD-10-CM | POA: Diagnosis not present

## 2021-07-20 DIAGNOSIS — K219 Gastro-esophageal reflux disease without esophagitis: Secondary | ICD-10-CM | POA: Diagnosis not present

## 2021-07-20 DIAGNOSIS — I2510 Atherosclerotic heart disease of native coronary artery without angina pectoris: Secondary | ICD-10-CM | POA: Diagnosis not present

## 2021-07-20 DIAGNOSIS — Z79891 Long term (current) use of opiate analgesic: Secondary | ICD-10-CM | POA: Diagnosis not present

## 2021-07-23 DIAGNOSIS — Z9981 Dependence on supplemental oxygen: Secondary | ICD-10-CM | POA: Diagnosis not present

## 2021-07-23 DIAGNOSIS — Z86711 Personal history of pulmonary embolism: Secondary | ICD-10-CM | POA: Diagnosis not present

## 2021-07-23 DIAGNOSIS — M519 Unspecified thoracic, thoracolumbar and lumbosacral intervertebral disc disorder: Secondary | ICD-10-CM | POA: Diagnosis not present

## 2021-07-23 DIAGNOSIS — I739 Peripheral vascular disease, unspecified: Secondary | ICD-10-CM | POA: Diagnosis not present

## 2021-07-23 DIAGNOSIS — I890 Lymphedema, not elsewhere classified: Secondary | ICD-10-CM | POA: Diagnosis not present

## 2021-07-23 DIAGNOSIS — I252 Old myocardial infarction: Secondary | ICD-10-CM | POA: Diagnosis not present

## 2021-07-23 DIAGNOSIS — M109 Gout, unspecified: Secondary | ICD-10-CM | POA: Diagnosis not present

## 2021-07-23 DIAGNOSIS — J440 Chronic obstructive pulmonary disease with acute lower respiratory infection: Secondary | ICD-10-CM | POA: Diagnosis not present

## 2021-07-23 DIAGNOSIS — J209 Acute bronchitis, unspecified: Secondary | ICD-10-CM | POA: Diagnosis not present

## 2021-07-23 DIAGNOSIS — D7219 Other eosinophilia: Secondary | ICD-10-CM | POA: Diagnosis not present

## 2021-07-23 DIAGNOSIS — E039 Hypothyroidism, unspecified: Secondary | ICD-10-CM | POA: Diagnosis not present

## 2021-07-23 DIAGNOSIS — K219 Gastro-esophageal reflux disease without esophagitis: Secondary | ICD-10-CM | POA: Diagnosis not present

## 2021-07-23 DIAGNOSIS — I10 Essential (primary) hypertension: Secondary | ICD-10-CM | POA: Diagnosis not present

## 2021-07-23 DIAGNOSIS — J441 Chronic obstructive pulmonary disease with (acute) exacerbation: Secondary | ICD-10-CM | POA: Diagnosis not present

## 2021-07-23 DIAGNOSIS — Z7901 Long term (current) use of anticoagulants: Secondary | ICD-10-CM | POA: Diagnosis not present

## 2021-07-23 DIAGNOSIS — K5790 Diverticulosis of intestine, part unspecified, without perforation or abscess without bleeding: Secondary | ICD-10-CM | POA: Diagnosis not present

## 2021-07-23 DIAGNOSIS — M1990 Unspecified osteoarthritis, unspecified site: Secondary | ICD-10-CM | POA: Diagnosis not present

## 2021-07-23 DIAGNOSIS — Z87891 Personal history of nicotine dependence: Secondary | ICD-10-CM | POA: Diagnosis not present

## 2021-07-23 DIAGNOSIS — Z86718 Personal history of other venous thrombosis and embolism: Secondary | ICD-10-CM | POA: Diagnosis not present

## 2021-07-23 DIAGNOSIS — I2510 Atherosclerotic heart disease of native coronary artery without angina pectoris: Secondary | ICD-10-CM | POA: Diagnosis not present

## 2021-07-23 DIAGNOSIS — J9621 Acute and chronic respiratory failure with hypoxia: Secondary | ICD-10-CM | POA: Diagnosis not present

## 2021-07-23 DIAGNOSIS — Z79891 Long term (current) use of opiate analgesic: Secondary | ICD-10-CM | POA: Diagnosis not present

## 2021-07-23 DIAGNOSIS — E7800 Pure hypercholesterolemia, unspecified: Secondary | ICD-10-CM | POA: Diagnosis not present

## 2021-07-23 DIAGNOSIS — S81802D Unspecified open wound, left lower leg, subsequent encounter: Secondary | ICD-10-CM | POA: Diagnosis not present

## 2021-07-24 DIAGNOSIS — M1990 Unspecified osteoarthritis, unspecified site: Secondary | ICD-10-CM | POA: Diagnosis not present

## 2021-07-24 DIAGNOSIS — Z9981 Dependence on supplemental oxygen: Secondary | ICD-10-CM | POA: Diagnosis not present

## 2021-07-24 DIAGNOSIS — Z86711 Personal history of pulmonary embolism: Secondary | ICD-10-CM | POA: Diagnosis not present

## 2021-07-24 DIAGNOSIS — E039 Hypothyroidism, unspecified: Secondary | ICD-10-CM | POA: Diagnosis not present

## 2021-07-24 DIAGNOSIS — S81802D Unspecified open wound, left lower leg, subsequent encounter: Secondary | ICD-10-CM | POA: Diagnosis not present

## 2021-07-24 DIAGNOSIS — M519 Unspecified thoracic, thoracolumbar and lumbosacral intervertebral disc disorder: Secondary | ICD-10-CM | POA: Diagnosis not present

## 2021-07-24 DIAGNOSIS — I890 Lymphedema, not elsewhere classified: Secondary | ICD-10-CM | POA: Diagnosis not present

## 2021-07-24 DIAGNOSIS — D7219 Other eosinophilia: Secondary | ICD-10-CM | POA: Diagnosis not present

## 2021-07-24 DIAGNOSIS — I739 Peripheral vascular disease, unspecified: Secondary | ICD-10-CM | POA: Diagnosis not present

## 2021-07-24 DIAGNOSIS — I252 Old myocardial infarction: Secondary | ICD-10-CM | POA: Diagnosis not present

## 2021-07-24 DIAGNOSIS — J209 Acute bronchitis, unspecified: Secondary | ICD-10-CM | POA: Diagnosis not present

## 2021-07-24 DIAGNOSIS — J440 Chronic obstructive pulmonary disease with acute lower respiratory infection: Secondary | ICD-10-CM | POA: Diagnosis not present

## 2021-07-24 DIAGNOSIS — E7800 Pure hypercholesterolemia, unspecified: Secondary | ICD-10-CM | POA: Diagnosis not present

## 2021-07-24 DIAGNOSIS — J441 Chronic obstructive pulmonary disease with (acute) exacerbation: Secondary | ICD-10-CM | POA: Diagnosis not present

## 2021-07-24 DIAGNOSIS — Z79891 Long term (current) use of opiate analgesic: Secondary | ICD-10-CM | POA: Diagnosis not present

## 2021-07-24 DIAGNOSIS — Z86718 Personal history of other venous thrombosis and embolism: Secondary | ICD-10-CM | POA: Diagnosis not present

## 2021-07-24 DIAGNOSIS — Z87891 Personal history of nicotine dependence: Secondary | ICD-10-CM | POA: Diagnosis not present

## 2021-07-24 DIAGNOSIS — J9621 Acute and chronic respiratory failure with hypoxia: Secondary | ICD-10-CM | POA: Diagnosis not present

## 2021-07-24 DIAGNOSIS — K219 Gastro-esophageal reflux disease without esophagitis: Secondary | ICD-10-CM | POA: Diagnosis not present

## 2021-07-24 DIAGNOSIS — I2510 Atherosclerotic heart disease of native coronary artery without angina pectoris: Secondary | ICD-10-CM | POA: Diagnosis not present

## 2021-07-24 DIAGNOSIS — Z7901 Long term (current) use of anticoagulants: Secondary | ICD-10-CM | POA: Diagnosis not present

## 2021-07-24 DIAGNOSIS — I10 Essential (primary) hypertension: Secondary | ICD-10-CM | POA: Diagnosis not present

## 2021-07-24 DIAGNOSIS — M109 Gout, unspecified: Secondary | ICD-10-CM | POA: Diagnosis not present

## 2021-07-24 DIAGNOSIS — K5790 Diverticulosis of intestine, part unspecified, without perforation or abscess without bleeding: Secondary | ICD-10-CM | POA: Diagnosis not present

## 2021-07-26 DIAGNOSIS — I2510 Atherosclerotic heart disease of native coronary artery without angina pectoris: Secondary | ICD-10-CM | POA: Diagnosis not present

## 2021-07-26 DIAGNOSIS — M542 Cervicalgia: Secondary | ICD-10-CM | POA: Diagnosis not present

## 2021-07-26 DIAGNOSIS — L309 Dermatitis, unspecified: Secondary | ICD-10-CM | POA: Diagnosis not present

## 2021-07-26 DIAGNOSIS — I10 Essential (primary) hypertension: Secondary | ICD-10-CM | POA: Diagnosis not present

## 2021-07-26 DIAGNOSIS — L299 Pruritus, unspecified: Secondary | ICD-10-CM | POA: Diagnosis not present

## 2021-07-26 DIAGNOSIS — M159 Polyosteoarthritis, unspecified: Secondary | ICD-10-CM | POA: Diagnosis not present

## 2021-07-26 DIAGNOSIS — L300 Nummular dermatitis: Secondary | ICD-10-CM | POA: Diagnosis not present

## 2021-07-26 DIAGNOSIS — G8929 Other chronic pain: Secondary | ICD-10-CM | POA: Diagnosis not present

## 2021-07-26 DIAGNOSIS — L0100 Impetigo, unspecified: Secondary | ICD-10-CM | POA: Diagnosis not present

## 2021-07-26 DIAGNOSIS — E559 Vitamin D deficiency, unspecified: Secondary | ICD-10-CM | POA: Diagnosis not present

## 2021-07-26 DIAGNOSIS — M545 Low back pain, unspecified: Secondary | ICD-10-CM | POA: Diagnosis not present

## 2021-07-27 DIAGNOSIS — D7219 Other eosinophilia: Secondary | ICD-10-CM | POA: Diagnosis not present

## 2021-07-27 DIAGNOSIS — I739 Peripheral vascular disease, unspecified: Secondary | ICD-10-CM | POA: Diagnosis not present

## 2021-07-27 DIAGNOSIS — Z7901 Long term (current) use of anticoagulants: Secondary | ICD-10-CM | POA: Diagnosis not present

## 2021-07-27 DIAGNOSIS — K5790 Diverticulosis of intestine, part unspecified, without perforation or abscess without bleeding: Secondary | ICD-10-CM | POA: Diagnosis not present

## 2021-07-27 DIAGNOSIS — M519 Unspecified thoracic, thoracolumbar and lumbosacral intervertebral disc disorder: Secondary | ICD-10-CM | POA: Diagnosis not present

## 2021-07-27 DIAGNOSIS — J209 Acute bronchitis, unspecified: Secondary | ICD-10-CM | POA: Diagnosis not present

## 2021-07-27 DIAGNOSIS — M109 Gout, unspecified: Secondary | ICD-10-CM | POA: Diagnosis not present

## 2021-07-27 DIAGNOSIS — Z86711 Personal history of pulmonary embolism: Secondary | ICD-10-CM | POA: Diagnosis not present

## 2021-07-27 DIAGNOSIS — Z9981 Dependence on supplemental oxygen: Secondary | ICD-10-CM | POA: Diagnosis not present

## 2021-07-27 DIAGNOSIS — Z86718 Personal history of other venous thrombosis and embolism: Secondary | ICD-10-CM | POA: Diagnosis not present

## 2021-07-27 DIAGNOSIS — E7800 Pure hypercholesterolemia, unspecified: Secondary | ICD-10-CM | POA: Diagnosis not present

## 2021-07-27 DIAGNOSIS — I252 Old myocardial infarction: Secondary | ICD-10-CM | POA: Diagnosis not present

## 2021-07-27 DIAGNOSIS — Z79891 Long term (current) use of opiate analgesic: Secondary | ICD-10-CM | POA: Diagnosis not present

## 2021-07-27 DIAGNOSIS — I10 Essential (primary) hypertension: Secondary | ICD-10-CM | POA: Diagnosis not present

## 2021-07-27 DIAGNOSIS — J440 Chronic obstructive pulmonary disease with acute lower respiratory infection: Secondary | ICD-10-CM | POA: Diagnosis not present

## 2021-07-27 DIAGNOSIS — J9621 Acute and chronic respiratory failure with hypoxia: Secondary | ICD-10-CM | POA: Diagnosis not present

## 2021-07-27 DIAGNOSIS — Z87891 Personal history of nicotine dependence: Secondary | ICD-10-CM | POA: Diagnosis not present

## 2021-07-27 DIAGNOSIS — J441 Chronic obstructive pulmonary disease with (acute) exacerbation: Secondary | ICD-10-CM | POA: Diagnosis not present

## 2021-07-27 DIAGNOSIS — I890 Lymphedema, not elsewhere classified: Secondary | ICD-10-CM | POA: Diagnosis not present

## 2021-07-27 DIAGNOSIS — M1990 Unspecified osteoarthritis, unspecified site: Secondary | ICD-10-CM | POA: Diagnosis not present

## 2021-07-27 DIAGNOSIS — K219 Gastro-esophageal reflux disease without esophagitis: Secondary | ICD-10-CM | POA: Diagnosis not present

## 2021-07-27 DIAGNOSIS — E039 Hypothyroidism, unspecified: Secondary | ICD-10-CM | POA: Diagnosis not present

## 2021-07-27 DIAGNOSIS — S81802D Unspecified open wound, left lower leg, subsequent encounter: Secondary | ICD-10-CM | POA: Diagnosis not present

## 2021-07-27 DIAGNOSIS — I2510 Atherosclerotic heart disease of native coronary artery without angina pectoris: Secondary | ICD-10-CM | POA: Diagnosis not present

## 2021-07-30 DIAGNOSIS — J440 Chronic obstructive pulmonary disease with acute lower respiratory infection: Secondary | ICD-10-CM | POA: Diagnosis not present

## 2021-07-30 DIAGNOSIS — E039 Hypothyroidism, unspecified: Secondary | ICD-10-CM | POA: Diagnosis not present

## 2021-07-30 DIAGNOSIS — D7219 Other eosinophilia: Secondary | ICD-10-CM | POA: Diagnosis not present

## 2021-07-30 DIAGNOSIS — I739 Peripheral vascular disease, unspecified: Secondary | ICD-10-CM | POA: Diagnosis not present

## 2021-07-30 DIAGNOSIS — Z9981 Dependence on supplemental oxygen: Secondary | ICD-10-CM | POA: Diagnosis not present

## 2021-07-30 DIAGNOSIS — J209 Acute bronchitis, unspecified: Secondary | ICD-10-CM | POA: Diagnosis not present

## 2021-07-30 DIAGNOSIS — E7800 Pure hypercholesterolemia, unspecified: Secondary | ICD-10-CM | POA: Diagnosis not present

## 2021-07-30 DIAGNOSIS — M519 Unspecified thoracic, thoracolumbar and lumbosacral intervertebral disc disorder: Secondary | ICD-10-CM | POA: Diagnosis not present

## 2021-07-30 DIAGNOSIS — Z86718 Personal history of other venous thrombosis and embolism: Secondary | ICD-10-CM | POA: Diagnosis not present

## 2021-07-30 DIAGNOSIS — I252 Old myocardial infarction: Secondary | ICD-10-CM | POA: Diagnosis not present

## 2021-07-30 DIAGNOSIS — Z7901 Long term (current) use of anticoagulants: Secondary | ICD-10-CM | POA: Diagnosis not present

## 2021-07-30 DIAGNOSIS — Z79891 Long term (current) use of opiate analgesic: Secondary | ICD-10-CM | POA: Diagnosis not present

## 2021-07-30 DIAGNOSIS — I2510 Atherosclerotic heart disease of native coronary artery without angina pectoris: Secondary | ICD-10-CM | POA: Diagnosis not present

## 2021-07-30 DIAGNOSIS — J441 Chronic obstructive pulmonary disease with (acute) exacerbation: Secondary | ICD-10-CM | POA: Diagnosis not present

## 2021-07-30 DIAGNOSIS — J9621 Acute and chronic respiratory failure with hypoxia: Secondary | ICD-10-CM | POA: Diagnosis not present

## 2021-07-30 DIAGNOSIS — M1990 Unspecified osteoarthritis, unspecified site: Secondary | ICD-10-CM | POA: Diagnosis not present

## 2021-07-30 DIAGNOSIS — M109 Gout, unspecified: Secondary | ICD-10-CM | POA: Diagnosis not present

## 2021-07-30 DIAGNOSIS — S81802D Unspecified open wound, left lower leg, subsequent encounter: Secondary | ICD-10-CM | POA: Diagnosis not present

## 2021-07-30 DIAGNOSIS — I10 Essential (primary) hypertension: Secondary | ICD-10-CM | POA: Diagnosis not present

## 2021-07-30 DIAGNOSIS — K5790 Diverticulosis of intestine, part unspecified, without perforation or abscess without bleeding: Secondary | ICD-10-CM | POA: Diagnosis not present

## 2021-07-30 DIAGNOSIS — I890 Lymphedema, not elsewhere classified: Secondary | ICD-10-CM | POA: Diagnosis not present

## 2021-07-30 DIAGNOSIS — Z86711 Personal history of pulmonary embolism: Secondary | ICD-10-CM | POA: Diagnosis not present

## 2021-07-30 DIAGNOSIS — K219 Gastro-esophageal reflux disease without esophagitis: Secondary | ICD-10-CM | POA: Diagnosis not present

## 2021-07-30 DIAGNOSIS — Z87891 Personal history of nicotine dependence: Secondary | ICD-10-CM | POA: Diagnosis not present

## 2021-08-01 DIAGNOSIS — I739 Peripheral vascular disease, unspecified: Secondary | ICD-10-CM | POA: Diagnosis not present

## 2021-08-01 DIAGNOSIS — I890 Lymphedema, not elsewhere classified: Secondary | ICD-10-CM | POA: Diagnosis not present

## 2021-08-01 DIAGNOSIS — M109 Gout, unspecified: Secondary | ICD-10-CM | POA: Diagnosis not present

## 2021-08-01 DIAGNOSIS — Z7901 Long term (current) use of anticoagulants: Secondary | ICD-10-CM | POA: Diagnosis not present

## 2021-08-01 DIAGNOSIS — E039 Hypothyroidism, unspecified: Secondary | ICD-10-CM | POA: Diagnosis not present

## 2021-08-01 DIAGNOSIS — M519 Unspecified thoracic, thoracolumbar and lumbosacral intervertebral disc disorder: Secondary | ICD-10-CM | POA: Diagnosis not present

## 2021-08-01 DIAGNOSIS — I10 Essential (primary) hypertension: Secondary | ICD-10-CM | POA: Diagnosis not present

## 2021-08-01 DIAGNOSIS — Z86711 Personal history of pulmonary embolism: Secondary | ICD-10-CM | POA: Diagnosis not present

## 2021-08-01 DIAGNOSIS — K219 Gastro-esophageal reflux disease without esophagitis: Secondary | ICD-10-CM | POA: Diagnosis not present

## 2021-08-01 DIAGNOSIS — J440 Chronic obstructive pulmonary disease with acute lower respiratory infection: Secondary | ICD-10-CM | POA: Diagnosis not present

## 2021-08-01 DIAGNOSIS — I2510 Atherosclerotic heart disease of native coronary artery without angina pectoris: Secondary | ICD-10-CM | POA: Diagnosis not present

## 2021-08-01 DIAGNOSIS — J209 Acute bronchitis, unspecified: Secondary | ICD-10-CM | POA: Diagnosis not present

## 2021-08-01 DIAGNOSIS — S81802D Unspecified open wound, left lower leg, subsequent encounter: Secondary | ICD-10-CM | POA: Diagnosis not present

## 2021-08-01 DIAGNOSIS — J9621 Acute and chronic respiratory failure with hypoxia: Secondary | ICD-10-CM | POA: Diagnosis not present

## 2021-08-01 DIAGNOSIS — M1990 Unspecified osteoarthritis, unspecified site: Secondary | ICD-10-CM | POA: Diagnosis not present

## 2021-08-01 DIAGNOSIS — I252 Old myocardial infarction: Secondary | ICD-10-CM | POA: Diagnosis not present

## 2021-08-01 DIAGNOSIS — Z87891 Personal history of nicotine dependence: Secondary | ICD-10-CM | POA: Diagnosis not present

## 2021-08-01 DIAGNOSIS — Z9981 Dependence on supplemental oxygen: Secondary | ICD-10-CM | POA: Diagnosis not present

## 2021-08-01 DIAGNOSIS — K5790 Diverticulosis of intestine, part unspecified, without perforation or abscess without bleeding: Secondary | ICD-10-CM | POA: Diagnosis not present

## 2021-08-01 DIAGNOSIS — E7800 Pure hypercholesterolemia, unspecified: Secondary | ICD-10-CM | POA: Diagnosis not present

## 2021-08-01 DIAGNOSIS — D7219 Other eosinophilia: Secondary | ICD-10-CM | POA: Diagnosis not present

## 2021-08-01 DIAGNOSIS — Z86718 Personal history of other venous thrombosis and embolism: Secondary | ICD-10-CM | POA: Diagnosis not present

## 2021-08-01 DIAGNOSIS — Z79891 Long term (current) use of opiate analgesic: Secondary | ICD-10-CM | POA: Diagnosis not present

## 2021-08-01 DIAGNOSIS — J441 Chronic obstructive pulmonary disease with (acute) exacerbation: Secondary | ICD-10-CM | POA: Diagnosis not present

## 2021-08-03 DIAGNOSIS — K219 Gastro-esophageal reflux disease without esophagitis: Secondary | ICD-10-CM | POA: Diagnosis not present

## 2021-08-03 DIAGNOSIS — Z9981 Dependence on supplemental oxygen: Secondary | ICD-10-CM | POA: Diagnosis not present

## 2021-08-03 DIAGNOSIS — Z7901 Long term (current) use of anticoagulants: Secondary | ICD-10-CM | POA: Diagnosis not present

## 2021-08-03 DIAGNOSIS — I252 Old myocardial infarction: Secondary | ICD-10-CM | POA: Diagnosis not present

## 2021-08-03 DIAGNOSIS — J209 Acute bronchitis, unspecified: Secondary | ICD-10-CM | POA: Diagnosis not present

## 2021-08-03 DIAGNOSIS — J441 Chronic obstructive pulmonary disease with (acute) exacerbation: Secondary | ICD-10-CM | POA: Diagnosis not present

## 2021-08-03 DIAGNOSIS — I10 Essential (primary) hypertension: Secondary | ICD-10-CM | POA: Diagnosis not present

## 2021-08-03 DIAGNOSIS — I739 Peripheral vascular disease, unspecified: Secondary | ICD-10-CM | POA: Diagnosis not present

## 2021-08-03 DIAGNOSIS — D7219 Other eosinophilia: Secondary | ICD-10-CM | POA: Diagnosis not present

## 2021-08-03 DIAGNOSIS — S81802D Unspecified open wound, left lower leg, subsequent encounter: Secondary | ICD-10-CM | POA: Diagnosis not present

## 2021-08-03 DIAGNOSIS — E7800 Pure hypercholesterolemia, unspecified: Secondary | ICD-10-CM | POA: Diagnosis not present

## 2021-08-03 DIAGNOSIS — Z87891 Personal history of nicotine dependence: Secondary | ICD-10-CM | POA: Diagnosis not present

## 2021-08-03 DIAGNOSIS — Z86718 Personal history of other venous thrombosis and embolism: Secondary | ICD-10-CM | POA: Diagnosis not present

## 2021-08-03 DIAGNOSIS — J9621 Acute and chronic respiratory failure with hypoxia: Secondary | ICD-10-CM | POA: Diagnosis not present

## 2021-08-03 DIAGNOSIS — K5790 Diverticulosis of intestine, part unspecified, without perforation or abscess without bleeding: Secondary | ICD-10-CM | POA: Diagnosis not present

## 2021-08-03 DIAGNOSIS — I890 Lymphedema, not elsewhere classified: Secondary | ICD-10-CM | POA: Diagnosis not present

## 2021-08-03 DIAGNOSIS — Z86711 Personal history of pulmonary embolism: Secondary | ICD-10-CM | POA: Diagnosis not present

## 2021-08-03 DIAGNOSIS — M519 Unspecified thoracic, thoracolumbar and lumbosacral intervertebral disc disorder: Secondary | ICD-10-CM | POA: Diagnosis not present

## 2021-08-03 DIAGNOSIS — M109 Gout, unspecified: Secondary | ICD-10-CM | POA: Diagnosis not present

## 2021-08-03 DIAGNOSIS — E039 Hypothyroidism, unspecified: Secondary | ICD-10-CM | POA: Diagnosis not present

## 2021-08-03 DIAGNOSIS — I2510 Atherosclerotic heart disease of native coronary artery without angina pectoris: Secondary | ICD-10-CM | POA: Diagnosis not present

## 2021-08-03 DIAGNOSIS — Z79891 Long term (current) use of opiate analgesic: Secondary | ICD-10-CM | POA: Diagnosis not present

## 2021-08-03 DIAGNOSIS — M1990 Unspecified osteoarthritis, unspecified site: Secondary | ICD-10-CM | POA: Diagnosis not present

## 2021-08-03 DIAGNOSIS — J440 Chronic obstructive pulmonary disease with acute lower respiratory infection: Secondary | ICD-10-CM | POA: Diagnosis not present

## 2021-08-06 DIAGNOSIS — K219 Gastro-esophageal reflux disease without esophagitis: Secondary | ICD-10-CM | POA: Diagnosis not present

## 2021-08-06 DIAGNOSIS — K5790 Diverticulosis of intestine, part unspecified, without perforation or abscess without bleeding: Secondary | ICD-10-CM | POA: Diagnosis not present

## 2021-08-06 DIAGNOSIS — Z87891 Personal history of nicotine dependence: Secondary | ICD-10-CM | POA: Diagnosis not present

## 2021-08-06 DIAGNOSIS — M109 Gout, unspecified: Secondary | ICD-10-CM | POA: Diagnosis not present

## 2021-08-06 DIAGNOSIS — J441 Chronic obstructive pulmonary disease with (acute) exacerbation: Secondary | ICD-10-CM | POA: Diagnosis not present

## 2021-08-06 DIAGNOSIS — Z86711 Personal history of pulmonary embolism: Secondary | ICD-10-CM | POA: Diagnosis not present

## 2021-08-06 DIAGNOSIS — J440 Chronic obstructive pulmonary disease with acute lower respiratory infection: Secondary | ICD-10-CM | POA: Diagnosis not present

## 2021-08-06 DIAGNOSIS — J209 Acute bronchitis, unspecified: Secondary | ICD-10-CM | POA: Diagnosis not present

## 2021-08-06 DIAGNOSIS — M1990 Unspecified osteoarthritis, unspecified site: Secondary | ICD-10-CM | POA: Diagnosis not present

## 2021-08-06 DIAGNOSIS — I252 Old myocardial infarction: Secondary | ICD-10-CM | POA: Diagnosis not present

## 2021-08-06 DIAGNOSIS — Z79891 Long term (current) use of opiate analgesic: Secondary | ICD-10-CM | POA: Diagnosis not present

## 2021-08-06 DIAGNOSIS — D7219 Other eosinophilia: Secondary | ICD-10-CM | POA: Diagnosis not present

## 2021-08-06 DIAGNOSIS — E7800 Pure hypercholesterolemia, unspecified: Secondary | ICD-10-CM | POA: Diagnosis not present

## 2021-08-06 DIAGNOSIS — Z9981 Dependence on supplemental oxygen: Secondary | ICD-10-CM | POA: Diagnosis not present

## 2021-08-06 DIAGNOSIS — Z7901 Long term (current) use of anticoagulants: Secondary | ICD-10-CM | POA: Diagnosis not present

## 2021-08-06 DIAGNOSIS — Z86718 Personal history of other venous thrombosis and embolism: Secondary | ICD-10-CM | POA: Diagnosis not present

## 2021-08-06 DIAGNOSIS — S81802D Unspecified open wound, left lower leg, subsequent encounter: Secondary | ICD-10-CM | POA: Diagnosis not present

## 2021-08-06 DIAGNOSIS — M519 Unspecified thoracic, thoracolumbar and lumbosacral intervertebral disc disorder: Secondary | ICD-10-CM | POA: Diagnosis not present

## 2021-08-06 DIAGNOSIS — I2510 Atherosclerotic heart disease of native coronary artery without angina pectoris: Secondary | ICD-10-CM | POA: Diagnosis not present

## 2021-08-06 DIAGNOSIS — I890 Lymphedema, not elsewhere classified: Secondary | ICD-10-CM | POA: Diagnosis not present

## 2021-08-06 DIAGNOSIS — E039 Hypothyroidism, unspecified: Secondary | ICD-10-CM | POA: Diagnosis not present

## 2021-08-06 DIAGNOSIS — I739 Peripheral vascular disease, unspecified: Secondary | ICD-10-CM | POA: Diagnosis not present

## 2021-08-06 DIAGNOSIS — I10 Essential (primary) hypertension: Secondary | ICD-10-CM | POA: Diagnosis not present

## 2021-08-06 DIAGNOSIS — J9621 Acute and chronic respiratory failure with hypoxia: Secondary | ICD-10-CM | POA: Diagnosis not present

## 2021-08-07 DIAGNOSIS — J449 Chronic obstructive pulmonary disease, unspecified: Secondary | ICD-10-CM | POA: Diagnosis not present

## 2021-08-07 DIAGNOSIS — R0902 Hypoxemia: Secondary | ICD-10-CM | POA: Diagnosis not present

## 2021-08-09 DIAGNOSIS — J209 Acute bronchitis, unspecified: Secondary | ICD-10-CM | POA: Diagnosis not present

## 2021-08-09 DIAGNOSIS — I739 Peripheral vascular disease, unspecified: Secondary | ICD-10-CM | POA: Diagnosis not present

## 2021-08-09 DIAGNOSIS — J440 Chronic obstructive pulmonary disease with acute lower respiratory infection: Secondary | ICD-10-CM | POA: Diagnosis not present

## 2021-08-09 DIAGNOSIS — M519 Unspecified thoracic, thoracolumbar and lumbosacral intervertebral disc disorder: Secondary | ICD-10-CM | POA: Diagnosis not present

## 2021-08-09 DIAGNOSIS — D7219 Other eosinophilia: Secondary | ICD-10-CM | POA: Diagnosis not present

## 2021-08-09 DIAGNOSIS — M109 Gout, unspecified: Secondary | ICD-10-CM | POA: Diagnosis not present

## 2021-08-09 DIAGNOSIS — I10 Essential (primary) hypertension: Secondary | ICD-10-CM | POA: Diagnosis not present

## 2021-08-09 DIAGNOSIS — I890 Lymphedema, not elsewhere classified: Secondary | ICD-10-CM | POA: Diagnosis not present

## 2021-08-09 DIAGNOSIS — E7800 Pure hypercholesterolemia, unspecified: Secondary | ICD-10-CM | POA: Diagnosis not present

## 2021-08-09 DIAGNOSIS — I2510 Atherosclerotic heart disease of native coronary artery without angina pectoris: Secondary | ICD-10-CM | POA: Diagnosis not present

## 2021-08-09 DIAGNOSIS — K5790 Diverticulosis of intestine, part unspecified, without perforation or abscess without bleeding: Secondary | ICD-10-CM | POA: Diagnosis not present

## 2021-08-09 DIAGNOSIS — S81802D Unspecified open wound, left lower leg, subsequent encounter: Secondary | ICD-10-CM | POA: Diagnosis not present

## 2021-08-09 DIAGNOSIS — Z86711 Personal history of pulmonary embolism: Secondary | ICD-10-CM | POA: Diagnosis not present

## 2021-08-09 DIAGNOSIS — Z7901 Long term (current) use of anticoagulants: Secondary | ICD-10-CM | POA: Diagnosis not present

## 2021-08-09 DIAGNOSIS — Z79891 Long term (current) use of opiate analgesic: Secondary | ICD-10-CM | POA: Diagnosis not present

## 2021-08-09 DIAGNOSIS — Z86718 Personal history of other venous thrombosis and embolism: Secondary | ICD-10-CM | POA: Diagnosis not present

## 2021-08-09 DIAGNOSIS — J441 Chronic obstructive pulmonary disease with (acute) exacerbation: Secondary | ICD-10-CM | POA: Diagnosis not present

## 2021-08-09 DIAGNOSIS — Z9981 Dependence on supplemental oxygen: Secondary | ICD-10-CM | POA: Diagnosis not present

## 2021-08-09 DIAGNOSIS — M1990 Unspecified osteoarthritis, unspecified site: Secondary | ICD-10-CM | POA: Diagnosis not present

## 2021-08-09 DIAGNOSIS — I252 Old myocardial infarction: Secondary | ICD-10-CM | POA: Diagnosis not present

## 2021-08-09 DIAGNOSIS — E039 Hypothyroidism, unspecified: Secondary | ICD-10-CM | POA: Diagnosis not present

## 2021-08-09 DIAGNOSIS — Z87891 Personal history of nicotine dependence: Secondary | ICD-10-CM | POA: Diagnosis not present

## 2021-08-09 DIAGNOSIS — K219 Gastro-esophageal reflux disease without esophagitis: Secondary | ICD-10-CM | POA: Diagnosis not present

## 2021-08-09 DIAGNOSIS — J9621 Acute and chronic respiratory failure with hypoxia: Secondary | ICD-10-CM | POA: Diagnosis not present

## 2021-08-10 DIAGNOSIS — S81802D Unspecified open wound, left lower leg, subsequent encounter: Secondary | ICD-10-CM | POA: Diagnosis not present

## 2021-08-10 DIAGNOSIS — J441 Chronic obstructive pulmonary disease with (acute) exacerbation: Secondary | ICD-10-CM | POA: Diagnosis not present

## 2021-08-10 DIAGNOSIS — M519 Unspecified thoracic, thoracolumbar and lumbosacral intervertebral disc disorder: Secondary | ICD-10-CM | POA: Diagnosis not present

## 2021-08-10 DIAGNOSIS — Z86711 Personal history of pulmonary embolism: Secondary | ICD-10-CM | POA: Diagnosis not present

## 2021-08-10 DIAGNOSIS — D7219 Other eosinophilia: Secondary | ICD-10-CM | POA: Diagnosis not present

## 2021-08-10 DIAGNOSIS — J9621 Acute and chronic respiratory failure with hypoxia: Secondary | ICD-10-CM | POA: Diagnosis not present

## 2021-08-10 DIAGNOSIS — M109 Gout, unspecified: Secondary | ICD-10-CM | POA: Diagnosis not present

## 2021-08-10 DIAGNOSIS — I739 Peripheral vascular disease, unspecified: Secondary | ICD-10-CM | POA: Diagnosis not present

## 2021-08-10 DIAGNOSIS — K5790 Diverticulosis of intestine, part unspecified, without perforation or abscess without bleeding: Secondary | ICD-10-CM | POA: Diagnosis not present

## 2021-08-10 DIAGNOSIS — E039 Hypothyroidism, unspecified: Secondary | ICD-10-CM | POA: Diagnosis not present

## 2021-08-10 DIAGNOSIS — I252 Old myocardial infarction: Secondary | ICD-10-CM | POA: Diagnosis not present

## 2021-08-10 DIAGNOSIS — J440 Chronic obstructive pulmonary disease with acute lower respiratory infection: Secondary | ICD-10-CM | POA: Diagnosis not present

## 2021-08-10 DIAGNOSIS — Z7901 Long term (current) use of anticoagulants: Secondary | ICD-10-CM | POA: Diagnosis not present

## 2021-08-10 DIAGNOSIS — K219 Gastro-esophageal reflux disease without esophagitis: Secondary | ICD-10-CM | POA: Diagnosis not present

## 2021-08-10 DIAGNOSIS — I890 Lymphedema, not elsewhere classified: Secondary | ICD-10-CM | POA: Diagnosis not present

## 2021-08-10 DIAGNOSIS — I2510 Atherosclerotic heart disease of native coronary artery without angina pectoris: Secondary | ICD-10-CM | POA: Diagnosis not present

## 2021-08-10 DIAGNOSIS — J209 Acute bronchitis, unspecified: Secondary | ICD-10-CM | POA: Diagnosis not present

## 2021-08-10 DIAGNOSIS — E7800 Pure hypercholesterolemia, unspecified: Secondary | ICD-10-CM | POA: Diagnosis not present

## 2021-08-10 DIAGNOSIS — I10 Essential (primary) hypertension: Secondary | ICD-10-CM | POA: Diagnosis not present

## 2021-08-10 DIAGNOSIS — Z87891 Personal history of nicotine dependence: Secondary | ICD-10-CM | POA: Diagnosis not present

## 2021-08-10 DIAGNOSIS — Z9981 Dependence on supplemental oxygen: Secondary | ICD-10-CM | POA: Diagnosis not present

## 2021-08-10 DIAGNOSIS — M1990 Unspecified osteoarthritis, unspecified site: Secondary | ICD-10-CM | POA: Diagnosis not present

## 2021-08-10 DIAGNOSIS — Z86718 Personal history of other venous thrombosis and embolism: Secondary | ICD-10-CM | POA: Diagnosis not present

## 2021-08-10 DIAGNOSIS — Z79891 Long term (current) use of opiate analgesic: Secondary | ICD-10-CM | POA: Diagnosis not present

## 2021-08-14 DIAGNOSIS — Z86718 Personal history of other venous thrombosis and embolism: Secondary | ICD-10-CM | POA: Diagnosis not present

## 2021-08-14 DIAGNOSIS — Z87891 Personal history of nicotine dependence: Secondary | ICD-10-CM | POA: Diagnosis not present

## 2021-08-14 DIAGNOSIS — I10 Essential (primary) hypertension: Secondary | ICD-10-CM | POA: Diagnosis not present

## 2021-08-14 DIAGNOSIS — E039 Hypothyroidism, unspecified: Secondary | ICD-10-CM | POA: Diagnosis not present

## 2021-08-14 DIAGNOSIS — D7219 Other eosinophilia: Secondary | ICD-10-CM | POA: Diagnosis not present

## 2021-08-14 DIAGNOSIS — J441 Chronic obstructive pulmonary disease with (acute) exacerbation: Secondary | ICD-10-CM | POA: Diagnosis not present

## 2021-08-14 DIAGNOSIS — M1990 Unspecified osteoarthritis, unspecified site: Secondary | ICD-10-CM | POA: Diagnosis not present

## 2021-08-14 DIAGNOSIS — E7800 Pure hypercholesterolemia, unspecified: Secondary | ICD-10-CM | POA: Diagnosis not present

## 2021-08-14 DIAGNOSIS — I890 Lymphedema, not elsewhere classified: Secondary | ICD-10-CM | POA: Diagnosis not present

## 2021-08-14 DIAGNOSIS — K5790 Diverticulosis of intestine, part unspecified, without perforation or abscess without bleeding: Secondary | ICD-10-CM | POA: Diagnosis not present

## 2021-08-14 DIAGNOSIS — I252 Old myocardial infarction: Secondary | ICD-10-CM | POA: Diagnosis not present

## 2021-08-14 DIAGNOSIS — J9621 Acute and chronic respiratory failure with hypoxia: Secondary | ICD-10-CM | POA: Diagnosis not present

## 2021-08-14 DIAGNOSIS — K219 Gastro-esophageal reflux disease without esophagitis: Secondary | ICD-10-CM | POA: Diagnosis not present

## 2021-08-14 DIAGNOSIS — S81802D Unspecified open wound, left lower leg, subsequent encounter: Secondary | ICD-10-CM | POA: Diagnosis not present

## 2021-08-14 DIAGNOSIS — Z7901 Long term (current) use of anticoagulants: Secondary | ICD-10-CM | POA: Diagnosis not present

## 2021-08-14 DIAGNOSIS — Z86711 Personal history of pulmonary embolism: Secondary | ICD-10-CM | POA: Diagnosis not present

## 2021-08-14 DIAGNOSIS — J440 Chronic obstructive pulmonary disease with acute lower respiratory infection: Secondary | ICD-10-CM | POA: Diagnosis not present

## 2021-08-14 DIAGNOSIS — I2510 Atherosclerotic heart disease of native coronary artery without angina pectoris: Secondary | ICD-10-CM | POA: Diagnosis not present

## 2021-08-14 DIAGNOSIS — Z79891 Long term (current) use of opiate analgesic: Secondary | ICD-10-CM | POA: Diagnosis not present

## 2021-08-14 DIAGNOSIS — I739 Peripheral vascular disease, unspecified: Secondary | ICD-10-CM | POA: Diagnosis not present

## 2021-08-14 DIAGNOSIS — M109 Gout, unspecified: Secondary | ICD-10-CM | POA: Diagnosis not present

## 2021-08-14 DIAGNOSIS — J209 Acute bronchitis, unspecified: Secondary | ICD-10-CM | POA: Diagnosis not present

## 2021-08-14 DIAGNOSIS — M519 Unspecified thoracic, thoracolumbar and lumbosacral intervertebral disc disorder: Secondary | ICD-10-CM | POA: Diagnosis not present

## 2021-08-14 DIAGNOSIS — Z9981 Dependence on supplemental oxygen: Secondary | ICD-10-CM | POA: Diagnosis not present

## 2021-08-17 DIAGNOSIS — J209 Acute bronchitis, unspecified: Secondary | ICD-10-CM | POA: Diagnosis not present

## 2021-08-17 DIAGNOSIS — I739 Peripheral vascular disease, unspecified: Secondary | ICD-10-CM | POA: Diagnosis not present

## 2021-08-17 DIAGNOSIS — M519 Unspecified thoracic, thoracolumbar and lumbosacral intervertebral disc disorder: Secondary | ICD-10-CM | POA: Diagnosis not present

## 2021-08-17 DIAGNOSIS — Z79891 Long term (current) use of opiate analgesic: Secondary | ICD-10-CM | POA: Diagnosis not present

## 2021-08-17 DIAGNOSIS — M109 Gout, unspecified: Secondary | ICD-10-CM | POA: Diagnosis not present

## 2021-08-17 DIAGNOSIS — I2510 Atherosclerotic heart disease of native coronary artery without angina pectoris: Secondary | ICD-10-CM | POA: Diagnosis not present

## 2021-08-17 DIAGNOSIS — J440 Chronic obstructive pulmonary disease with acute lower respiratory infection: Secondary | ICD-10-CM | POA: Diagnosis not present

## 2021-08-17 DIAGNOSIS — Z9981 Dependence on supplemental oxygen: Secondary | ICD-10-CM | POA: Diagnosis not present

## 2021-08-17 DIAGNOSIS — Z87891 Personal history of nicotine dependence: Secondary | ICD-10-CM | POA: Diagnosis not present

## 2021-08-17 DIAGNOSIS — J441 Chronic obstructive pulmonary disease with (acute) exacerbation: Secondary | ICD-10-CM | POA: Diagnosis not present

## 2021-08-17 DIAGNOSIS — I10 Essential (primary) hypertension: Secondary | ICD-10-CM | POA: Diagnosis not present

## 2021-08-17 DIAGNOSIS — Z86711 Personal history of pulmonary embolism: Secondary | ICD-10-CM | POA: Diagnosis not present

## 2021-08-17 DIAGNOSIS — Z86718 Personal history of other venous thrombosis and embolism: Secondary | ICD-10-CM | POA: Diagnosis not present

## 2021-08-17 DIAGNOSIS — S81802D Unspecified open wound, left lower leg, subsequent encounter: Secondary | ICD-10-CM | POA: Diagnosis not present

## 2021-08-17 DIAGNOSIS — I252 Old myocardial infarction: Secondary | ICD-10-CM | POA: Diagnosis not present

## 2021-08-17 DIAGNOSIS — Z7901 Long term (current) use of anticoagulants: Secondary | ICD-10-CM | POA: Diagnosis not present

## 2021-08-17 DIAGNOSIS — K219 Gastro-esophageal reflux disease without esophagitis: Secondary | ICD-10-CM | POA: Diagnosis not present

## 2021-08-17 DIAGNOSIS — E039 Hypothyroidism, unspecified: Secondary | ICD-10-CM | POA: Diagnosis not present

## 2021-08-17 DIAGNOSIS — K5790 Diverticulosis of intestine, part unspecified, without perforation or abscess without bleeding: Secondary | ICD-10-CM | POA: Diagnosis not present

## 2021-08-17 DIAGNOSIS — J9621 Acute and chronic respiratory failure with hypoxia: Secondary | ICD-10-CM | POA: Diagnosis not present

## 2021-08-17 DIAGNOSIS — E7800 Pure hypercholesterolemia, unspecified: Secondary | ICD-10-CM | POA: Diagnosis not present

## 2021-08-17 DIAGNOSIS — I890 Lymphedema, not elsewhere classified: Secondary | ICD-10-CM | POA: Diagnosis not present

## 2021-08-17 DIAGNOSIS — M1990 Unspecified osteoarthritis, unspecified site: Secondary | ICD-10-CM | POA: Diagnosis not present

## 2021-08-17 DIAGNOSIS — D7219 Other eosinophilia: Secondary | ICD-10-CM | POA: Diagnosis not present

## 2021-08-18 DIAGNOSIS — L0100 Impetigo, unspecified: Secondary | ICD-10-CM | POA: Diagnosis not present

## 2021-08-18 DIAGNOSIS — L209 Atopic dermatitis, unspecified: Secondary | ICD-10-CM | POA: Diagnosis not present

## 2021-08-18 DIAGNOSIS — L853 Xerosis cutis: Secondary | ICD-10-CM | POA: Diagnosis not present

## 2021-08-18 DIAGNOSIS — L299 Pruritus, unspecified: Secondary | ICD-10-CM | POA: Diagnosis not present

## 2021-08-24 DIAGNOSIS — I252 Old myocardial infarction: Secondary | ICD-10-CM | POA: Diagnosis not present

## 2021-08-24 DIAGNOSIS — K219 Gastro-esophageal reflux disease without esophagitis: Secondary | ICD-10-CM | POA: Diagnosis not present

## 2021-08-24 DIAGNOSIS — Z79891 Long term (current) use of opiate analgesic: Secondary | ICD-10-CM | POA: Diagnosis not present

## 2021-08-24 DIAGNOSIS — M1990 Unspecified osteoarthritis, unspecified site: Secondary | ICD-10-CM | POA: Diagnosis not present

## 2021-08-24 DIAGNOSIS — Z86718 Personal history of other venous thrombosis and embolism: Secondary | ICD-10-CM | POA: Diagnosis not present

## 2021-08-24 DIAGNOSIS — Z7901 Long term (current) use of anticoagulants: Secondary | ICD-10-CM | POA: Diagnosis not present

## 2021-08-24 DIAGNOSIS — Z86711 Personal history of pulmonary embolism: Secondary | ICD-10-CM | POA: Diagnosis not present

## 2021-08-24 DIAGNOSIS — I2510 Atherosclerotic heart disease of native coronary artery without angina pectoris: Secondary | ICD-10-CM | POA: Diagnosis not present

## 2021-08-24 DIAGNOSIS — E7800 Pure hypercholesterolemia, unspecified: Secondary | ICD-10-CM | POA: Diagnosis not present

## 2021-08-24 DIAGNOSIS — I10 Essential (primary) hypertension: Secondary | ICD-10-CM | POA: Diagnosis not present

## 2021-08-24 DIAGNOSIS — E039 Hypothyroidism, unspecified: Secondary | ICD-10-CM | POA: Diagnosis not present

## 2021-08-24 DIAGNOSIS — S81802D Unspecified open wound, left lower leg, subsequent encounter: Secondary | ICD-10-CM | POA: Diagnosis not present

## 2021-08-24 DIAGNOSIS — K5790 Diverticulosis of intestine, part unspecified, without perforation or abscess without bleeding: Secondary | ICD-10-CM | POA: Diagnosis not present

## 2021-08-24 DIAGNOSIS — Z9981 Dependence on supplemental oxygen: Secondary | ICD-10-CM | POA: Diagnosis not present

## 2021-08-24 DIAGNOSIS — I890 Lymphedema, not elsewhere classified: Secondary | ICD-10-CM | POA: Diagnosis not present

## 2021-08-24 DIAGNOSIS — M109 Gout, unspecified: Secondary | ICD-10-CM | POA: Diagnosis not present

## 2021-08-24 DIAGNOSIS — J440 Chronic obstructive pulmonary disease with acute lower respiratory infection: Secondary | ICD-10-CM | POA: Diagnosis not present

## 2021-08-24 DIAGNOSIS — Z87891 Personal history of nicotine dependence: Secondary | ICD-10-CM | POA: Diagnosis not present

## 2021-08-24 DIAGNOSIS — M519 Unspecified thoracic, thoracolumbar and lumbosacral intervertebral disc disorder: Secondary | ICD-10-CM | POA: Diagnosis not present

## 2021-08-24 DIAGNOSIS — J441 Chronic obstructive pulmonary disease with (acute) exacerbation: Secondary | ICD-10-CM | POA: Diagnosis not present

## 2021-08-24 DIAGNOSIS — D7219 Other eosinophilia: Secondary | ICD-10-CM | POA: Diagnosis not present

## 2021-08-24 DIAGNOSIS — I739 Peripheral vascular disease, unspecified: Secondary | ICD-10-CM | POA: Diagnosis not present

## 2021-08-24 DIAGNOSIS — J9621 Acute and chronic respiratory failure with hypoxia: Secondary | ICD-10-CM | POA: Diagnosis not present

## 2021-08-24 DIAGNOSIS — J209 Acute bronchitis, unspecified: Secondary | ICD-10-CM | POA: Diagnosis not present

## 2021-08-29 DIAGNOSIS — J9621 Acute and chronic respiratory failure with hypoxia: Secondary | ICD-10-CM | POA: Diagnosis not present

## 2021-08-29 DIAGNOSIS — J209 Acute bronchitis, unspecified: Secondary | ICD-10-CM | POA: Diagnosis not present

## 2021-08-29 DIAGNOSIS — Z9981 Dependence on supplemental oxygen: Secondary | ICD-10-CM | POA: Diagnosis not present

## 2021-08-29 DIAGNOSIS — J440 Chronic obstructive pulmonary disease with acute lower respiratory infection: Secondary | ICD-10-CM | POA: Diagnosis not present

## 2021-08-29 DIAGNOSIS — K5790 Diverticulosis of intestine, part unspecified, without perforation or abscess without bleeding: Secondary | ICD-10-CM | POA: Diagnosis not present

## 2021-08-29 DIAGNOSIS — Z7901 Long term (current) use of anticoagulants: Secondary | ICD-10-CM | POA: Diagnosis not present

## 2021-08-29 DIAGNOSIS — Z86718 Personal history of other venous thrombosis and embolism: Secondary | ICD-10-CM | POA: Diagnosis not present

## 2021-08-29 DIAGNOSIS — I2510 Atherosclerotic heart disease of native coronary artery without angina pectoris: Secondary | ICD-10-CM | POA: Diagnosis not present

## 2021-08-29 DIAGNOSIS — I10 Essential (primary) hypertension: Secondary | ICD-10-CM | POA: Diagnosis not present

## 2021-08-29 DIAGNOSIS — M109 Gout, unspecified: Secondary | ICD-10-CM | POA: Diagnosis not present

## 2021-08-29 DIAGNOSIS — E039 Hypothyroidism, unspecified: Secondary | ICD-10-CM | POA: Diagnosis not present

## 2021-08-29 DIAGNOSIS — E7800 Pure hypercholesterolemia, unspecified: Secondary | ICD-10-CM | POA: Diagnosis not present

## 2021-08-29 DIAGNOSIS — Z79891 Long term (current) use of opiate analgesic: Secondary | ICD-10-CM | POA: Diagnosis not present

## 2021-08-29 DIAGNOSIS — Z87891 Personal history of nicotine dependence: Secondary | ICD-10-CM | POA: Diagnosis not present

## 2021-08-29 DIAGNOSIS — I890 Lymphedema, not elsewhere classified: Secondary | ICD-10-CM | POA: Diagnosis not present

## 2021-08-29 DIAGNOSIS — K219 Gastro-esophageal reflux disease without esophagitis: Secondary | ICD-10-CM | POA: Diagnosis not present

## 2021-08-29 DIAGNOSIS — S81802D Unspecified open wound, left lower leg, subsequent encounter: Secondary | ICD-10-CM | POA: Diagnosis not present

## 2021-08-29 DIAGNOSIS — M1990 Unspecified osteoarthritis, unspecified site: Secondary | ICD-10-CM | POA: Diagnosis not present

## 2021-08-29 DIAGNOSIS — D7219 Other eosinophilia: Secondary | ICD-10-CM | POA: Diagnosis not present

## 2021-08-29 DIAGNOSIS — I739 Peripheral vascular disease, unspecified: Secondary | ICD-10-CM | POA: Diagnosis not present

## 2021-08-29 DIAGNOSIS — Z86711 Personal history of pulmonary embolism: Secondary | ICD-10-CM | POA: Diagnosis not present

## 2021-08-29 DIAGNOSIS — J441 Chronic obstructive pulmonary disease with (acute) exacerbation: Secondary | ICD-10-CM | POA: Diagnosis not present

## 2021-08-29 DIAGNOSIS — I252 Old myocardial infarction: Secondary | ICD-10-CM | POA: Diagnosis not present

## 2021-08-29 DIAGNOSIS — M519 Unspecified thoracic, thoracolumbar and lumbosacral intervertebral disc disorder: Secondary | ICD-10-CM | POA: Diagnosis not present

## 2021-09-05 DIAGNOSIS — Z87891 Personal history of nicotine dependence: Secondary | ICD-10-CM | POA: Diagnosis not present

## 2021-09-05 DIAGNOSIS — D7219 Other eosinophilia: Secondary | ICD-10-CM | POA: Diagnosis not present

## 2021-09-05 DIAGNOSIS — K5790 Diverticulosis of intestine, part unspecified, without perforation or abscess without bleeding: Secondary | ICD-10-CM | POA: Diagnosis not present

## 2021-09-05 DIAGNOSIS — S81802D Unspecified open wound, left lower leg, subsequent encounter: Secondary | ICD-10-CM | POA: Diagnosis not present

## 2021-09-05 DIAGNOSIS — I2510 Atherosclerotic heart disease of native coronary artery without angina pectoris: Secondary | ICD-10-CM | POA: Diagnosis not present

## 2021-09-05 DIAGNOSIS — M109 Gout, unspecified: Secondary | ICD-10-CM | POA: Diagnosis not present

## 2021-09-05 DIAGNOSIS — I890 Lymphedema, not elsewhere classified: Secondary | ICD-10-CM | POA: Diagnosis not present

## 2021-09-05 DIAGNOSIS — J441 Chronic obstructive pulmonary disease with (acute) exacerbation: Secondary | ICD-10-CM | POA: Diagnosis not present

## 2021-09-05 DIAGNOSIS — J209 Acute bronchitis, unspecified: Secondary | ICD-10-CM | POA: Diagnosis not present

## 2021-09-05 DIAGNOSIS — M1990 Unspecified osteoarthritis, unspecified site: Secondary | ICD-10-CM | POA: Diagnosis not present

## 2021-09-05 DIAGNOSIS — I252 Old myocardial infarction: Secondary | ICD-10-CM | POA: Diagnosis not present

## 2021-09-05 DIAGNOSIS — Z86711 Personal history of pulmonary embolism: Secondary | ICD-10-CM | POA: Diagnosis not present

## 2021-09-05 DIAGNOSIS — E039 Hypothyroidism, unspecified: Secondary | ICD-10-CM | POA: Diagnosis not present

## 2021-09-05 DIAGNOSIS — Z79891 Long term (current) use of opiate analgesic: Secondary | ICD-10-CM | POA: Diagnosis not present

## 2021-09-05 DIAGNOSIS — J440 Chronic obstructive pulmonary disease with acute lower respiratory infection: Secondary | ICD-10-CM | POA: Diagnosis not present

## 2021-09-05 DIAGNOSIS — Z9981 Dependence on supplemental oxygen: Secondary | ICD-10-CM | POA: Diagnosis not present

## 2021-09-05 DIAGNOSIS — K219 Gastro-esophageal reflux disease without esophagitis: Secondary | ICD-10-CM | POA: Diagnosis not present

## 2021-09-05 DIAGNOSIS — M519 Unspecified thoracic, thoracolumbar and lumbosacral intervertebral disc disorder: Secondary | ICD-10-CM | POA: Diagnosis not present

## 2021-09-05 DIAGNOSIS — Z86718 Personal history of other venous thrombosis and embolism: Secondary | ICD-10-CM | POA: Diagnosis not present

## 2021-09-05 DIAGNOSIS — I739 Peripheral vascular disease, unspecified: Secondary | ICD-10-CM | POA: Diagnosis not present

## 2021-09-05 DIAGNOSIS — J9621 Acute and chronic respiratory failure with hypoxia: Secondary | ICD-10-CM | POA: Diagnosis not present

## 2021-09-05 DIAGNOSIS — Z7901 Long term (current) use of anticoagulants: Secondary | ICD-10-CM | POA: Diagnosis not present

## 2021-09-05 DIAGNOSIS — I10 Essential (primary) hypertension: Secondary | ICD-10-CM | POA: Diagnosis not present

## 2021-09-05 DIAGNOSIS — E7800 Pure hypercholesterolemia, unspecified: Secondary | ICD-10-CM | POA: Diagnosis not present

## 2021-09-07 DIAGNOSIS — M545 Low back pain, unspecified: Secondary | ICD-10-CM | POA: Diagnosis not present

## 2021-09-07 DIAGNOSIS — I2510 Atherosclerotic heart disease of native coronary artery without angina pectoris: Secondary | ICD-10-CM | POA: Diagnosis not present

## 2021-09-07 DIAGNOSIS — I10 Essential (primary) hypertension: Secondary | ICD-10-CM | POA: Diagnosis not present

## 2021-09-07 DIAGNOSIS — R0902 Hypoxemia: Secondary | ICD-10-CM | POA: Diagnosis not present

## 2021-09-07 DIAGNOSIS — M159 Polyosteoarthritis, unspecified: Secondary | ICD-10-CM | POA: Diagnosis not present

## 2021-09-07 DIAGNOSIS — R5382 Chronic fatigue, unspecified: Secondary | ICD-10-CM | POA: Diagnosis not present

## 2021-09-07 DIAGNOSIS — L309 Dermatitis, unspecified: Secondary | ICD-10-CM | POA: Diagnosis not present

## 2021-09-07 DIAGNOSIS — E785 Hyperlipidemia, unspecified: Secondary | ICD-10-CM | POA: Diagnosis not present

## 2021-09-07 DIAGNOSIS — J449 Chronic obstructive pulmonary disease, unspecified: Secondary | ICD-10-CM | POA: Diagnosis not present

## 2021-09-07 DIAGNOSIS — J309 Allergic rhinitis, unspecified: Secondary | ICD-10-CM | POA: Diagnosis not present

## 2021-09-07 DIAGNOSIS — G8929 Other chronic pain: Secondary | ICD-10-CM | POA: Diagnosis not present

## 2021-09-07 DIAGNOSIS — L0390 Cellulitis, unspecified: Secondary | ICD-10-CM | POA: Diagnosis not present

## 2021-09-07 DIAGNOSIS — E538 Deficiency of other specified B group vitamins: Secondary | ICD-10-CM | POA: Diagnosis not present

## 2021-09-07 DIAGNOSIS — E559 Vitamin D deficiency, unspecified: Secondary | ICD-10-CM | POA: Diagnosis not present

## 2021-09-07 DIAGNOSIS — M542 Cervicalgia: Secondary | ICD-10-CM | POA: Diagnosis not present

## 2021-09-25 DIAGNOSIS — I10 Essential (primary) hypertension: Secondary | ICD-10-CM | POA: Diagnosis not present

## 2021-09-25 DIAGNOSIS — M159 Polyosteoarthritis, unspecified: Secondary | ICD-10-CM | POA: Diagnosis not present

## 2021-09-25 DIAGNOSIS — M542 Cervicalgia: Secondary | ICD-10-CM | POA: Diagnosis not present

## 2021-09-25 DIAGNOSIS — M545 Low back pain, unspecified: Secondary | ICD-10-CM | POA: Diagnosis not present

## 2021-09-25 DIAGNOSIS — L309 Dermatitis, unspecified: Secondary | ICD-10-CM | POA: Diagnosis not present

## 2021-09-25 DIAGNOSIS — E559 Vitamin D deficiency, unspecified: Secondary | ICD-10-CM | POA: Diagnosis not present

## 2021-09-25 DIAGNOSIS — I2510 Atherosclerotic heart disease of native coronary artery without angina pectoris: Secondary | ICD-10-CM | POA: Diagnosis not present

## 2021-09-25 DIAGNOSIS — J309 Allergic rhinitis, unspecified: Secondary | ICD-10-CM | POA: Diagnosis not present

## 2021-09-25 DIAGNOSIS — G8929 Other chronic pain: Secondary | ICD-10-CM | POA: Diagnosis not present

## 2021-09-25 DIAGNOSIS — M109 Gout, unspecified: Secondary | ICD-10-CM | POA: Diagnosis not present

## 2021-09-25 DIAGNOSIS — L0390 Cellulitis, unspecified: Secondary | ICD-10-CM | POA: Diagnosis not present

## 2021-09-27 DIAGNOSIS — M159 Polyosteoarthritis, unspecified: Secondary | ICD-10-CM | POA: Diagnosis not present

## 2021-09-27 DIAGNOSIS — I2510 Atherosclerotic heart disease of native coronary artery without angina pectoris: Secondary | ICD-10-CM | POA: Diagnosis not present

## 2021-09-27 DIAGNOSIS — L0390 Cellulitis, unspecified: Secondary | ICD-10-CM | POA: Diagnosis not present

## 2021-09-27 DIAGNOSIS — E559 Vitamin D deficiency, unspecified: Secondary | ICD-10-CM | POA: Diagnosis not present

## 2021-09-27 DIAGNOSIS — L309 Dermatitis, unspecified: Secondary | ICD-10-CM | POA: Diagnosis not present

## 2021-09-27 DIAGNOSIS — K219 Gastro-esophageal reflux disease without esophagitis: Secondary | ICD-10-CM | POA: Diagnosis not present

## 2021-09-27 DIAGNOSIS — G8929 Other chronic pain: Secondary | ICD-10-CM | POA: Diagnosis not present

## 2021-09-27 DIAGNOSIS — J309 Allergic rhinitis, unspecified: Secondary | ICD-10-CM | POA: Diagnosis not present

## 2021-09-27 DIAGNOSIS — M542 Cervicalgia: Secondary | ICD-10-CM | POA: Diagnosis not present

## 2021-09-27 DIAGNOSIS — I10 Essential (primary) hypertension: Secondary | ICD-10-CM | POA: Diagnosis not present

## 2021-09-27 DIAGNOSIS — M545 Low back pain, unspecified: Secondary | ICD-10-CM | POA: Diagnosis not present

## 2021-09-29 DIAGNOSIS — L299 Pruritus, unspecified: Secondary | ICD-10-CM | POA: Diagnosis not present

## 2021-09-29 DIAGNOSIS — L0100 Impetigo, unspecified: Secondary | ICD-10-CM | POA: Diagnosis not present

## 2021-09-29 DIAGNOSIS — L209 Atopic dermatitis, unspecified: Secondary | ICD-10-CM | POA: Diagnosis not present

## 2021-10-02 DIAGNOSIS — G8929 Other chronic pain: Secondary | ICD-10-CM | POA: Diagnosis not present

## 2021-10-02 DIAGNOSIS — I2510 Atherosclerotic heart disease of native coronary artery without angina pectoris: Secondary | ICD-10-CM | POA: Diagnosis not present

## 2021-10-02 DIAGNOSIS — L309 Dermatitis, unspecified: Secondary | ICD-10-CM | POA: Diagnosis not present

## 2021-10-02 DIAGNOSIS — I10 Essential (primary) hypertension: Secondary | ICD-10-CM | POA: Diagnosis not present

## 2021-10-02 DIAGNOSIS — M159 Polyosteoarthritis, unspecified: Secondary | ICD-10-CM | POA: Diagnosis not present

## 2021-10-02 DIAGNOSIS — E559 Vitamin D deficiency, unspecified: Secondary | ICD-10-CM | POA: Diagnosis not present

## 2021-10-02 DIAGNOSIS — J309 Allergic rhinitis, unspecified: Secondary | ICD-10-CM | POA: Diagnosis not present

## 2021-10-02 DIAGNOSIS — L0390 Cellulitis, unspecified: Secondary | ICD-10-CM | POA: Diagnosis not present

## 2021-10-02 DIAGNOSIS — M542 Cervicalgia: Secondary | ICD-10-CM | POA: Diagnosis not present

## 2021-10-02 DIAGNOSIS — M545 Low back pain, unspecified: Secondary | ICD-10-CM | POA: Diagnosis not present

## 2021-10-02 DIAGNOSIS — K219 Gastro-esophageal reflux disease without esophagitis: Secondary | ICD-10-CM | POA: Diagnosis not present

## 2021-10-08 DIAGNOSIS — J449 Chronic obstructive pulmonary disease, unspecified: Secondary | ICD-10-CM | POA: Diagnosis not present

## 2021-10-08 DIAGNOSIS — R0902 Hypoxemia: Secondary | ICD-10-CM | POA: Diagnosis not present

## 2021-10-12 DIAGNOSIS — J449 Chronic obstructive pulmonary disease, unspecified: Secondary | ICD-10-CM | POA: Diagnosis not present

## 2021-10-12 DIAGNOSIS — G8929 Other chronic pain: Secondary | ICD-10-CM | POA: Diagnosis not present

## 2021-10-12 DIAGNOSIS — J208 Acute bronchitis due to other specified organisms: Secondary | ICD-10-CM | POA: Diagnosis not present

## 2021-10-12 DIAGNOSIS — M545 Low back pain, unspecified: Secondary | ICD-10-CM | POA: Diagnosis not present

## 2021-10-12 DIAGNOSIS — I10 Essential (primary) hypertension: Secondary | ICD-10-CM | POA: Diagnosis not present

## 2021-10-12 DIAGNOSIS — M159 Polyosteoarthritis, unspecified: Secondary | ICD-10-CM | POA: Diagnosis not present

## 2021-10-12 DIAGNOSIS — J309 Allergic rhinitis, unspecified: Secondary | ICD-10-CM | POA: Diagnosis not present

## 2021-10-12 DIAGNOSIS — K219 Gastro-esophageal reflux disease without esophagitis: Secondary | ICD-10-CM | POA: Diagnosis not present

## 2021-10-12 DIAGNOSIS — I2510 Atherosclerotic heart disease of native coronary artery without angina pectoris: Secondary | ICD-10-CM | POA: Diagnosis not present

## 2021-10-12 DIAGNOSIS — L0390 Cellulitis, unspecified: Secondary | ICD-10-CM | POA: Diagnosis not present

## 2021-10-12 DIAGNOSIS — B9689 Other specified bacterial agents as the cause of diseases classified elsewhere: Secondary | ICD-10-CM | POA: Diagnosis not present

## 2023-01-16 DIAGNOSIS — I272 Pulmonary hypertension, unspecified: Secondary | ICD-10-CM

## 2023-01-16 DIAGNOSIS — I361 Nonrheumatic tricuspid (valve) insufficiency: Secondary | ICD-10-CM | POA: Diagnosis not present

## 2023-02-15 DEATH — deceased
